# Patient Record
Sex: Male | Born: 1965 | Race: Black or African American | Hispanic: No | Marital: Married | Smoking: Former smoker
Health system: Southern US, Community
[De-identification: ages and names within clinical notes are randomized; demographics above are authoritative.]

## PROBLEM LIST (undated history)

## (undated) ENCOUNTER — Emergency Department (HOSPITAL_COMMUNITY): Admission: EM

## (undated) ENCOUNTER — Emergency Department (HOSPITAL_COMMUNITY): Payer: Medicaid Other

## (undated) DIAGNOSIS — F1010 Alcohol abuse, uncomplicated: Secondary | ICD-10-CM

## (undated) DIAGNOSIS — F1210 Cannabis abuse, uncomplicated: Secondary | ICD-10-CM

## (undated) DIAGNOSIS — R591 Generalized enlarged lymph nodes: Secondary | ICD-10-CM

## (undated) DIAGNOSIS — C801 Malignant (primary) neoplasm, unspecified: Secondary | ICD-10-CM

## (undated) HISTORY — PX: HERNIA REPAIR: SHX51

---

## 2000-03-11 ENCOUNTER — Encounter: Payer: Self-pay | Admitting: Internal Medicine

## 2000-03-11 ENCOUNTER — Emergency Department (HOSPITAL_COMMUNITY): Admission: EM | Admit: 2000-03-11 | Discharge: 2000-03-11 | Payer: Self-pay

## 2007-12-20 ENCOUNTER — Emergency Department (HOSPITAL_COMMUNITY): Admission: EM | Admit: 2007-12-20 | Discharge: 2007-12-20 | Payer: Self-pay | Admitting: Emergency Medicine

## 2009-04-14 ENCOUNTER — Emergency Department (HOSPITAL_COMMUNITY): Admission: EM | Admit: 2009-04-14 | Discharge: 2009-04-14 | Payer: Self-pay | Admitting: Family Medicine
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
## 2010-09-22 ENCOUNTER — Inpatient Hospital Stay (INDEPENDENT_AMBULATORY_CARE_PROVIDER_SITE_OTHER)
Admission: RE | Admit: 2010-09-22 | Discharge: 2010-09-22 | Disposition: A | Discharge status: Home / Self Care | Payer: Self-pay | Source: Ambulatory Visit | Attending: Emergency Medicine | Admitting: Emergency Medicine

## 2010-09-22 ENCOUNTER — Ambulatory Visit (INDEPENDENT_AMBULATORY_CARE_PROVIDER_SITE_OTHER): Payer: Self-pay

## 2010-09-22 DIAGNOSIS — S20229A Contusion of unspecified back wall of thorax, initial encounter: Secondary | ICD-10-CM

## 2010-09-22 DIAGNOSIS — R071 Chest pain on breathing: Secondary | ICD-10-CM
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
## 2010-11-21 LAB — POCT URINALYSIS DIP (DEVICE)
Bilirubin Urine: NEGATIVE
Glucose, UA: NEGATIVE mg/dL
Nitrite: NEGATIVE
Protein, ur: NEGATIVE mg/dL
Specific Gravity, Urine: 1.025 (ref 1.005–1.030)
Urobilinogen, UA: 1 mg/dL (ref 0.0–1.0)
pH: 7 (ref 5.0–8.0)

## 2017-02-09 ENCOUNTER — Encounter (HOSPITAL_COMMUNITY): Payer: Self-pay | Admitting: *Deleted

## 2017-02-09 ENCOUNTER — Ambulatory Visit (HOSPITAL_COMMUNITY)
Admission: EM | Admit: 2017-02-09 | Discharge: 2017-02-09 | Disposition: A | Discharge status: Home / Self Care | Payer: Self-pay | Attending: Family Medicine | Admitting: Family Medicine

## 2017-02-09 DIAGNOSIS — R300 Dysuria: Secondary | ICD-10-CM | POA: Insufficient documentation

## 2017-02-09 DIAGNOSIS — R360 Urethral discharge without blood: Secondary | ICD-10-CM

## 2017-02-09 DIAGNOSIS — R369 Urethral discharge, unspecified: Secondary | ICD-10-CM | POA: Insufficient documentation

## 2017-02-09 MED ORDER — AZITHROMYCIN 250 MG PO TABS: 1000.0000 mg | ORAL_TABLET | Freq: Once | ORAL | Status: DC

## 2017-02-09 MED ORDER — CEFTRIAXONE SODIUM 250 MG IJ SOLR
250.0000 mg | Freq: Once | INTRAMUSCULAR | Status: AC
  Administered 2017-02-09: 250 mg via INTRAMUSCULAR

## 2017-02-09 MED ORDER — AZITHROMYCIN 250 MG PO TABS
1000.0000 mg | ORAL_TABLET | Freq: Once | ORAL | Status: AC
  Administered 2017-02-09: 1000 mg via ORAL
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
## 2017-02-10 LAB — URINE CYTOLOGY ANCILLARY ONLY
CHLAMYDIA, DNA PROBE: NEGATIVE
Neisseria Gonorrhea: POSITIVE — AB
Trichomonas: NEGATIVE

## 2017-02-11 NOTE — ED Provider Notes (Signed)
  [place]   [id number] 02/09/17 Arrival Time: [place]:  1. Penile discharge, without blood     Meds ordered this encounter  Medications  . cefTRIAXone (ROCEPHIN) injection 250 mg  . azithromycin (ZITHROMAX) tablet 1,000 mg   Treated for GC/Chlamydia. Cytology pending. Declines HIV testing.  Reviewed expectations re: course of current medical issues. Questions answered. Outlined signs and symptoms indicating need for more acute intervention. Follow up here or in the Emergency Department if worsening. Patient verbalized understanding. After Visit Summary given.   SUBJECTIVE:  Jesse [name] is a 51 y.o. male who presents with complaint of penile discharge for 1 day. White/yellow. No rashes/lesions. Slight dysuria. No blood noticed. One male sexual partner with occasional condom use. Afebrile. No pelvic, testicular, or abdominal symptoms.  ROS: As per HPI.   OBJECTIVE:  Vitals:   02/09/17 1855  BP: 124/88  Pulse: 78  Resp: 18  Temp: 98.6 F (37 C)  TempSrc: Oral  SpO2: 100%     General appearance: alert, cooperative, appears stated age and no distress Back:  ROM normal; no CVA tenderness Lungs: clear to auscultation bilaterally Heart: regular rate and rhythm Abdomen: soft, non-tender; bowel sounds normal; no masses or organomegaly; no guarding or rebound tenderness GU: declines Skin: warm and dry; no rashes or lesions  No Known Allergies  PMHx, SurgHx, SocialHx, Medications, and Allergies were reviewed in the Visit Navigator and updated as appropriate.       Vanessa Kick, MD 02/11/17 [phone]

## 2018-04-14 ENCOUNTER — Encounter (HOSPITAL_COMMUNITY): Payer: Self-pay

## 2018-04-14 ENCOUNTER — Ambulatory Visit (HOSPITAL_COMMUNITY)
Admission: EM | Admit: 2018-04-14 | Discharge: 2018-04-14 | Disposition: A | Discharge status: Home / Self Care | Payer: Self-pay | Attending: Internal Medicine | Admitting: Internal Medicine

## 2018-04-14 DIAGNOSIS — F17200 Nicotine dependence, unspecified, uncomplicated: Secondary | ICD-10-CM | POA: Insufficient documentation

## 2018-04-14 DIAGNOSIS — R300 Dysuria: Secondary | ICD-10-CM | POA: Insufficient documentation

## 2018-04-14 DIAGNOSIS — Z113 Encounter for screening for infections with a predominantly sexual mode of transmission: Secondary | ICD-10-CM

## 2018-04-14 DIAGNOSIS — Z202 Contact with and (suspected) exposure to infections with a predominantly sexual mode of transmission: Secondary | ICD-10-CM | POA: Insufficient documentation

## 2018-04-14 DIAGNOSIS — R369 Urethral discharge, unspecified: Secondary | ICD-10-CM | POA: Insufficient documentation

## 2018-04-14 DIAGNOSIS — R360 Urethral discharge without blood: Secondary | ICD-10-CM

## 2018-04-14 MED ORDER — AZITHROMYCIN 250 MG PO TABS
1000.0000 mg | ORAL_TABLET | Freq: Once | ORAL | Status: AC
  Administered 2018-04-14: 1000 mg via ORAL

## 2018-04-14 MED ORDER — CEFTRIAXONE SODIUM 250 MG IJ SOLR
INTRAMUSCULAR | Status: AC
  Filled 2018-04-14: qty 250

## 2018-04-14 MED ORDER — CEFTRIAXONE SODIUM 250 MG IJ SOLR
250.0000 mg | Freq: Once | INTRAMUSCULAR | Status: AC
  Administered 2018-04-14: 250 mg via INTRAMUSCULAR

## 2018-04-14 MED ORDER — AZITHROMYCIN 250 MG PO TABS
ORAL_TABLET | ORAL | Status: AC
  Filled 2018-04-14: qty 4

## 2018-04-14 MED ORDER — LIDOCAINE HCL (PF) 1 % IJ SOLN
INTRAMUSCULAR | Status: AC
  Filled 2018-04-14: qty 2

## 2018-04-14 NOTE — Discharge Instructions (Signed)
You were treated empirically for gonorrhea, chlamydia. Azithromycin 1g by mouth and Rocephin 250mg  injection given in office today. Cytology sent, you will be contacted with any positive results that requires further treatment. Refrain from sexual activity and alcohol use for the next 7 days. Monitor for any worsening of symptoms, penile lesion/sore, testicular swelling/pain to follow up for reevaluation.

## 2018-04-14 NOTE — ED Provider Notes (Signed)
[facility]    CSN: [id number] Arrival date & time: 04/14/18  1719     History   Chief Complaint Chief Complaint  Patient presents with  . Exposure to STD    HPI Jesse [name] is a 52 y.o. male.   52 year old male comes in for few day history of penile discharge, dysuria.  Denies fever, chills, night sweats.  Denies abdominal pain, nausea, vomiting.  Denies urinary frequency, hematuria.  Denies penile lesion, testicular swelling, testicular pain.  Sexually active with 2 male partners, no condom use.     History reviewed. No pertinent past medical history.  There are no active problems to display for this patient.   History reviewed. No pertinent surgical history.     Home Medications    Prior to Admission medications   Not on File    Family History History reviewed. No pertinent family history.  Social History Social History   Tobacco Use  . Smoking status: Current Some Day Smoker  . Smokeless tobacco: Never Used  Substance Use Topics  . Alcohol use: Yes  . Drug use: Not on file     Allergies   Patient has no known allergies.   Review of Systems Review of Systems  Reason unable to perform ROS: See HPI as above.     Physical Exam Triage Vital Signs ED Triage Vitals [04/14/18 1755]  Enc Vitals Group     BP 108/68     Pulse Rate 66     Resp 20     Temp 98.4 F (36.9 C)     Temp Source Oral     SpO2 99 %     Weight      Height      Head Circumference      Peak Flow      Pain Score      Pain Loc      Pain Edu?      Excl. in [place]?    No data found.  Updated Vital Signs BP 108/68 (BP Location: Right Arm)   Pulse 66   Temp 98.4 F (36.9 C) (Oral)   Resp 20   SpO2 99%   Physical Exam  Constitutional: He is oriented to person, place, and time. He appears well-developed and well-nourished. No distress.  HENT:  Head: Normocephalic and atraumatic.  Eyes: Pupils are equal, round, and reactive to light. Conjunctivae are  normal.  Neurological: He is alert and oriented to person, place, and time.  Skin: He is not diaphoretic.     UC Treatments / Results  Labs (all labs ordered are listed, but only abnormal results are displayed) Labs Reviewed  URINE CYTOLOGY ANCILLARY ONLY    EKG None  Radiology No results found.  Procedures Procedures (including critical care time)  Medications Ordered in UC Medications  azithromycin (ZITHROMAX) tablet 1,000 mg (1,000 mg Oral Given 04/14/18 1832)  cefTRIAXone (ROCEPHIN) injection 250 mg (250 mg Intramuscular Given 04/14/18 1832)    Initial Impression / Assessment and Plan / UC Course  I have reviewed the triage vital signs and the nursing notes.  Pertinent labs & imaging results that were available during my care of the patient were reviewed by me and considered in my medical decision making (see chart for details).    Patient was treated empirically for GC. Azithromycin and Rocephin given in office today. Cytology sent, patient will be contacted with any positive results that require additional treatment. Patient to refrain from sexual activity for the  next 7 days. Return precautions given.   Final Clinical Impressions(s) / UC Diagnoses   Final diagnoses:  Exposure to STD    ED Prescriptions    None        Ok Edwards, PA-C 04/14/18 1947

## 2018-04-14 NOTE — ED Triage Notes (Signed)
Pt presents today after STD exposure for testing and treatment.

## 2018-04-18 ENCOUNTER — Telehealth (HOSPITAL_COMMUNITY): Payer: Self-pay

## 2018-04-18 LAB — URINE CYTOLOGY ANCILLARY ONLY
Chlamydia: NEGATIVE
Neisseria Gonorrhea: POSITIVE — AB
Trichomonas: NEGATIVE

## 2018-04-18 NOTE — Telephone Encounter (Signed)
Test for gonorrhea was positive. This was treated at the urgent care visit with IM rocephin 250mg  and po zithromax 1g.Need to educate patient to refrain from sexual intercourse for 7 days after treatment to give the medicine time to work. Sexual partners need to be notified and tested/treated. Condoms may reduce risk of reinfection. Recheck or followup with PCP for further evaluation if symptoms are not improving.  [facility] notified. Attempted to reach patient. No answer at this time.

## 2018-04-18 NOTE — Telephone Encounter (Signed)
Pt is aware of results. 

## 2018-04-22 LAB — URINE CYTOLOGY ANCILLARY ONLY: CANDIDA VAGINITIS: NEGATIVE

## 2018-08-16 DIAGNOSIS — C801 Malignant (primary) neoplasm, unspecified: Secondary | ICD-10-CM

## 2018-08-16 HISTORY — DX: Malignant (primary) neoplasm, unspecified: C80.1

## 2018-10-07 ENCOUNTER — Encounter (HOSPITAL_COMMUNITY): Payer: Self-pay | Admitting: Family Medicine

## 2018-10-07 ENCOUNTER — Ambulatory Visit (HOSPITAL_COMMUNITY)
Admission: EM | Admit: 2018-10-07 | Discharge: 2018-10-07 | Disposition: A | Discharge status: Home / Self Care | Payer: Self-pay | Attending: Family Medicine | Admitting: Family Medicine

## 2018-10-07 DIAGNOSIS — K047 Periapical abscess without sinus: Secondary | ICD-10-CM

## 2018-10-07 MED ORDER — AMOXICILLIN-POT CLAVULANATE 875-125 MG PO TABS: 1.0000 | ORAL_TABLET | Freq: Two times a day (BID) | ORAL | 0 refills | Status: DC

## 2018-10-07 NOTE — Discharge Instructions (Addendum)
Come back on March 10 at 9:30 am

## 2018-10-07 NOTE — ED Triage Notes (Signed)
Pt here for left sided neck pain with a swollen area x 3 weeks
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
## 2018-10-08 ENCOUNTER — Telehealth (HOSPITAL_COMMUNITY): Payer: Self-pay | Admitting: Emergency Medicine

## 2018-10-08 MED ORDER — AMOXICILLIN 500 MG PO CAPS: 500.0000 mg | ORAL_CAPSULE | Freq: Two times a day (BID) | ORAL | 0 refills | Status: AC

## 2018-10-08 NOTE — Telephone Encounter (Signed)
Pt called and stated he cant afford meds; new RX sent to pharmacy

## 2018-10-24 ENCOUNTER — Other Ambulatory Visit: Payer: Self-pay

## 2018-10-24 ENCOUNTER — Encounter (HOSPITAL_COMMUNITY): Payer: Self-pay

## 2018-10-24 ENCOUNTER — Ambulatory Visit (HOSPITAL_COMMUNITY)
Admission: EM | Admit: 2018-10-24 | Discharge: 2018-10-24 | Disposition: A | Discharge status: Home / Self Care | Payer: Self-pay | Attending: Family Medicine | Admitting: Family Medicine

## 2018-10-24 DIAGNOSIS — S0005XA Superficial foreign body of scalp, initial encounter: Secondary | ICD-10-CM

## 2018-10-24 DIAGNOSIS — R590 Localized enlarged lymph nodes: Secondary | ICD-10-CM

## 2018-10-24 DIAGNOSIS — K0530 Chronic periodontitis, unspecified: Secondary | ICD-10-CM

## 2018-10-24 MED ORDER — AMOXICILLIN 875 MG PO TABS: 875.0000 mg | ORAL_TABLET | Freq: Two times a day (BID) | ORAL | 0 refills | Status: DC
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
## 2018-10-24 NOTE — Discharge Instructions (Addendum)
Wednesday March 18 after 1 pm for removing stitch

## 2018-11-01 ENCOUNTER — Encounter (HOSPITAL_COMMUNITY): Payer: Self-pay | Admitting: Emergency Medicine

## 2018-11-01 ENCOUNTER — Other Ambulatory Visit: Payer: Self-pay

## 2018-11-01 ENCOUNTER — Ambulatory Visit (HOSPITAL_COMMUNITY)
Admission: EM | Admit: 2018-11-01 | Discharge: 2018-11-01 | Disposition: A | Discharge status: Home / Self Care | Payer: Self-pay

## 2018-11-01 DIAGNOSIS — S0000XD Unspecified superficial injury of scalp, subsequent encounter: Secondary | ICD-10-CM

## 2018-11-01 DIAGNOSIS — Z4802 Encounter for removal of sutures: Secondary | ICD-10-CM
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
## 2019-01-30 ENCOUNTER — Telehealth (HOSPITAL_COMMUNITY): Payer: Self-pay | Admitting: Emergency Medicine

## 2019-01-30 NOTE — Telephone Encounter (Signed)
Patient called requesting refill on medication that [facility] prescribed back in march 2020.  Informed patient the [facility] does not write refills on medications (not a primary care facility).  Patient reports he will come to [facility] tomorrow for the tooth pain and medication.

## 2019-01-31 ENCOUNTER — Encounter (HOSPITAL_COMMUNITY): Payer: Self-pay | Admitting: Emergency Medicine

## 2019-01-31 ENCOUNTER — Other Ambulatory Visit: Payer: Self-pay

## 2019-01-31 ENCOUNTER — Ambulatory Visit (HOSPITAL_COMMUNITY)
Admission: EM | Admit: 2019-01-31 | Discharge: 2019-01-31 | Disposition: A | Discharge status: Home / Self Care | Payer: Self-pay | Attending: Internal Medicine | Admitting: Internal Medicine

## 2019-01-31 DIAGNOSIS — K0889 Other specified disorders of teeth and supporting structures: Secondary | ICD-10-CM

## 2019-01-31 DIAGNOSIS — R591 Generalized enlarged lymph nodes: Secondary | ICD-10-CM

## 2019-01-31 MED ORDER — AMOXICILLIN-POT CLAVULANATE 875-125 MG PO TABS: 1.0000 | ORAL_TABLET | Freq: Two times a day (BID) | ORAL | 0 refills | Status: AC

## 2019-01-31 NOTE — ED Provider Notes (Signed)
[place]    CSN: [id number] Arrival date & time: 01/31/19  1019      History   Chief Complaint Chief Complaint  Patient presents with  . Dental Pain    HPI Jesse [name] is a 53 y.o. male no significant past medical history presenting today for evaluation of dental pain.  Patient states that he has had left-sided lower dental pain over the past 1 to 2 weeks.  He is noticed some mainly swelling and is concerned about infection.  Pain has been manageable and is been relatively mild.  He has previously been on antibiotics which is helped with her symptoms.  He is concerned as he notices the swelling come back relatively quickly after completion of antibiotics.  He has not been able to follow-up with dentistry.  He denies any fevers, neck stiffness. HPI  History reviewed. No pertinent past medical history.  There are no active problems to display for this patient.   History reviewed. No pertinent surgical history.     Home Medications    Prior to Admission medications   Medication Sig Start Date End Date Taking? Authorizing Provider  amoxicillin-clavulanate (AUGMENTIN) 875-125 MG tablet Take 1 tablet by mouth every 12 (twelve) hours for 7 days. 01/31/19 02/07/19  Elveria Lauderbaugh, Elesa Hacker, PA-C    Family History Family History  Problem Relation Age of Onset  . Cancer Mother     Social History Social History   Tobacco Use  . Smoking status: Current Some Day Smoker  . Smokeless tobacco: Never Used  Substance Use Topics  . Alcohol use: Yes  . Drug use: Yes    Types: Marijuana     Allergies   Patient has no known allergies.   Review of Systems Review of Systems  Constitutional: Negative for activity change, appetite change, chills, fatigue and fever.  HENT: Positive for dental problem. Negative for congestion, ear pain, rhinorrhea, sinus pressure, sore throat and trouble swallowing.   Eyes: Negative for discharge and redness.  Respiratory: Negative for  cough, chest tightness and shortness of breath.   Cardiovascular: Negative for chest pain.  Gastrointestinal: Negative for abdominal pain, diarrhea, nausea and vomiting.  Musculoskeletal: Negative for myalgias.  Skin: Negative for rash.  Neurological: Negative for dizziness, light-headedness and headaches.     Physical Exam Triage Vital Signs ED Triage Vitals [01/31/19 1039]  Enc Vitals Group     BP (!) 139/95     Pulse Rate 85     Resp 18     Temp (!) 97.4 F (36.3 C)     Temp Source Oral     SpO2 98 %     Weight      Height      Head Circumference      Peak Flow      Pain Score 8     Pain Loc      Pain Edu?      Excl. in [place]?    No data found.  Updated Vital Signs BP (!) 139/95 (BP Location: Right Arm)   Pulse 85   Temp (!) 97.4 F (36.3 C) (Oral)   Resp 18   SpO2 98%   Visual Acuity Right Eye Distance:   Left Eye Distance:   Bilateral Distance:    Right Eye Near:   Left Eye Near:    Bilateral Near:     Physical Exam Vitals signs and nursing note reviewed.  Constitutional:      Appearance: He is well-developed.  HENT:     Head: Normocephalic and atraumatic.     Mouth/Throat:     Comments: Oral mucosa pink and moist, no tonsillar enlargement or exudate. Posterior pharynx patent and nonerythematous, no uvula deviation or swelling. Normal phonation.  Overall poor dentition multiple missing, broken or decaying teeth.  Left lower gingiva with tenderness to palpation and slight erythema.  No obvious abscess noted. Eyes:     Conjunctiva/sclera: Conjunctivae normal.  Neck:     Musculoskeletal: Neck supple.     Comments: Palpable lymphadenopathy present on anterior cervical chain and posterior cervical chain, nontender to touch, mobile Cardiovascular:     Rate and Rhythm: Normal rate and regular rhythm.     Heart sounds: No murmur.  Pulmonary:     Effort: Pulmonary effort is normal. No respiratory distress.     Breath sounds: Normal breath sounds.   Abdominal:     Palpations: Abdomen is soft.     Tenderness: There is no abdominal tenderness.  Skin:    General: Skin is warm and dry.  Neurological:     Mental Status: He is alert.      UC Treatments / Results  Labs (all labs ordered are listed, but only abnormal results are displayed) Labs Reviewed - No data to display  EKG None  Radiology No results found.  Procedures Procedures (including critical care time)  Medications Ordered in UC Medications - No data to display  Initial Impression / Assessment and Plan / UC Course  I have reviewed the triage vital signs and the nursing notes.  Pertinent labs & imaging results that were available during my care of the patient were reviewed by me and considered in my medical decision making (see chart for details).     Will cover for dental infection with Augmentin twice daily x1 week.  Tylenol and ibuprofen for pain.  Does have lymphadenopathy, questionable if this is the swelling patient is concerned about versus traditional swelling.  Will have follow-up with PCP if lymph node swelling persisting without resolution.  Provided dental resources to follow-up with dentistry for further management of poor dentition.  No soft palate swelling, do not suspect deep space infection at this time.  Vital signs stable.Discussed strict return precautions. Patient verbalized understanding and is agreeable with plan.  Final Clinical Impressions(s) / UC Diagnoses   Final diagnoses:  Pain, dental  Lymphadenopathy     Discharge Instructions     Begin Augmentin twice daily for the next week to cover for infection in the mouth Tylenol and ibuprofen for pain and swelling in lymph nodes Warm compresses  Please use attached dental resources for further management of teeth If continuing to have swollen lymph nodes please follow-up with primary care, contact info below   ED Prescriptions    Medication Sig Dispense Auth. Provider    amoxicillin-clavulanate (AUGMENTIN) 875-125 MG tablet Take 1 tablet by mouth every 12 (twelve) hours for 7 days. 14 tablet Eathen Budreau, [place] C, PA-C     Controlled Substance Prescriptions [place] Controlled Substance Registry consulted? Not Applicable   Janith Lima, [place] 01/31/19 1139

## 2019-01-31 NOTE — ED Triage Notes (Signed)
Pt here for left sided dental pain

## 2019-01-31 NOTE — Discharge Instructions (Addendum)
Begin Augmentin twice daily for the next week to cover for infection in the mouth Tylenol and ibuprofen for pain and swelling in lymph nodes Warm compresses  Please use attached dental resources for further management of teeth If continuing to have swollen lymph nodes please follow-up with primary care, contact info below

## 2019-04-18 ENCOUNTER — Other Ambulatory Visit: Payer: Self-pay

## 2019-04-18 ENCOUNTER — Emergency Department (HOSPITAL_COMMUNITY)
Admission: EM | Admit: 2019-04-18 | Discharge: 2019-04-18 | Disposition: A | Discharge status: Home / Self Care | Payer: Medicaid Other | Attending: Emergency Medicine | Admitting: Emergency Medicine

## 2019-04-18 ENCOUNTER — Encounter (HOSPITAL_COMMUNITY): Payer: Self-pay

## 2019-04-18 ENCOUNTER — Emergency Department (HOSPITAL_COMMUNITY): Payer: Medicaid Other

## 2019-04-18 DIAGNOSIS — R509 Fever, unspecified: Secondary | ICD-10-CM | POA: Insufficient documentation

## 2019-04-18 DIAGNOSIS — R0602 Shortness of breath: Secondary | ICD-10-CM | POA: Diagnosis not present

## 2019-04-18 DIAGNOSIS — R590 Localized enlarged lymph nodes: Secondary | ICD-10-CM | POA: Diagnosis not present

## 2019-04-18 DIAGNOSIS — R10816 Epigastric abdominal tenderness: Secondary | ICD-10-CM | POA: Diagnosis not present

## 2019-04-18 DIAGNOSIS — Z20828 Contact with and (suspected) exposure to other viral communicable diseases: Secondary | ICD-10-CM | POA: Diagnosis not present

## 2019-04-18 DIAGNOSIS — F1290 Cannabis use, unspecified, uncomplicated: Secondary | ICD-10-CM | POA: Diagnosis not present

## 2019-04-18 DIAGNOSIS — R221 Localized swelling, mass and lump, neck: Secondary | ICD-10-CM | POA: Diagnosis present

## 2019-04-18 DIAGNOSIS — Z720 Tobacco use: Secondary | ICD-10-CM | POA: Diagnosis not present

## 2019-04-18 LAB — COMPREHENSIVE METABOLIC PANEL
ALT: 18 U/L (ref 0–44)
AST: 20 U/L (ref 15–41)
Albumin: 3.6 g/dL (ref 3.5–5.0)
Alkaline Phosphatase: 118 U/L (ref 38–126)
Anion gap: 12 (ref 5–15)
BUN: 11 mg/dL (ref 6–20)
CO2: 24 mmol/L (ref 22–32)
Calcium: 8.9 mg/dL (ref 8.9–10.3)
Chloride: 97 mmol/L — ABNORMAL LOW (ref 98–111)
Creatinine, Ser: 0.85 mg/dL (ref 0.61–1.24)
GFR calc Af Amer: >60 mL/min (ref >60)
GFR calc non Af Amer: >60 mL/min (ref >60)
Glucose, Bld: 97 mg/dL (ref 70–99)
Potassium: 3.4 mmol/L — ABNORMAL LOW (ref 3.5–5.1)
Sodium: 133 mmol/L — ABNORMAL LOW (ref 135–145)
Total Bilirubin: 1 mg/dL (ref 0.3–1.2)
Total Protein: 7.6 g/dL (ref 6.5–8.1)

## 2019-04-18 LAB — CBC WITH DIFFERENTIAL/PLATELET
Abs Immature Granulocytes: 0 10*3/uL (ref 0.00–0.07)
Basophils Absolute: 0 10*3/uL (ref 0.0–0.1)
Basophils Relative: 0 %
Eosinophils Absolute: 0 10*3/uL (ref 0.0–0.5)
Eosinophils Relative: 0 %
HCT: 38.4 % — ABNORMAL LOW (ref 39.0–52.0)
Hemoglobin: 12.9 g/dL — ABNORMAL LOW (ref 13.0–17.0)
Lymphocytes Relative: 20 %
Lymphs Abs: 1.1 10*3/uL (ref 0.7–4.0)
MCH: 30.4 pg (ref 26.0–34.0)
MCHC: 33.6 g/dL (ref 30.0–36.0)
MCV: 90.6 fL (ref 80.0–100.0)
Monocytes Absolute: 0.9 10*3/uL (ref 0.1–1.0)
Monocytes Relative: 16 %
Neutro Abs: 3.6 10*3/uL (ref 1.7–7.7)
Neutrophils Relative %: 64 %
Platelets: 385 10*3/uL (ref 150–400)
RBC: 4.24 MIL/uL (ref 4.22–5.81)
RDW: 12.8 % (ref 11.5–15.5)
WBC: 5.6 10*3/uL (ref 4.0–10.5)
nRBC: 0 % (ref 0.0–0.2)
nRBC: 0 /100 WBC

## 2019-04-18 LAB — URINALYSIS, ROUTINE W REFLEX MICROSCOPIC
Bilirubin Urine: NEGATIVE
Glucose, UA: NEGATIVE mg/dL
Hgb urine dipstick: NEGATIVE
Ketones, ur: NEGATIVE mg/dL
Leukocytes,Ua: NEGATIVE
Nitrite: NEGATIVE
Protein, ur: NEGATIVE mg/dL
Specific Gravity, Urine: 1.002 — ABNORMAL LOW (ref 1.005–1.030)
pH: 8 (ref 5.0–8.0)

## 2019-04-18 LAB — LIPASE, BLOOD: Lipase: 20 U/L (ref 11–51)

## 2019-04-18 LAB — GROUP A STREP BY PCR: Group A Strep by PCR: NOT DETECTED

## 2019-04-18 LAB — SARS CORONAVIRUS 2 BY RT PCR (HOSPITAL ORDER, PERFORMED IN ~~LOC~~ HOSPITAL LAB): SARS Coronavirus 2: NEGATIVE

## 2019-04-18 IMAGING — CT CT CHEST W/ CM
2 of 4 series · 13 of 36 positions shown, 16 images · IV contrast (omnipaque)
Comparison: None.

CLINICAL DATA: Several months of enlarged lymph nodes in the left
side of the neck and supraclavicular area. Abdominal discomfort.
Possible weight loss and recent fever.

EXAM:
CT CHEST, ABDOMEN, AND PELVIS WITH CONTRAST
TECHNIQUE: Multidetector CT imaging of the chest, abdomen and pelvis was
performed following the standard protocol during bolus
administration of intravenous contrast.
CONTRAST:  75 mL OMNIPAQUE IOHEXOL 300 MG/ML  SOLN

[Series 3: cap with 5mm st · axial · 0.72mm/px · z∈[+380,+940]mm · 10 of 134 slices shown, 13 images]
[im 11/134  mediastinal]
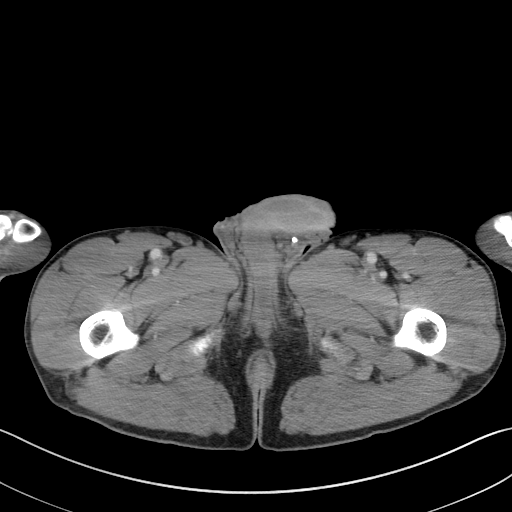
[im 11/134  lung]
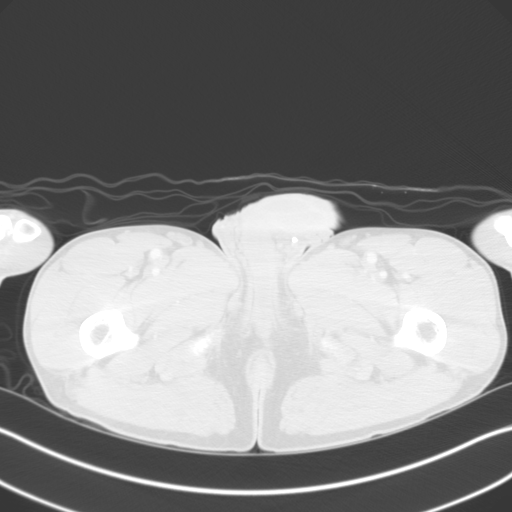
[im 21/134  lung]
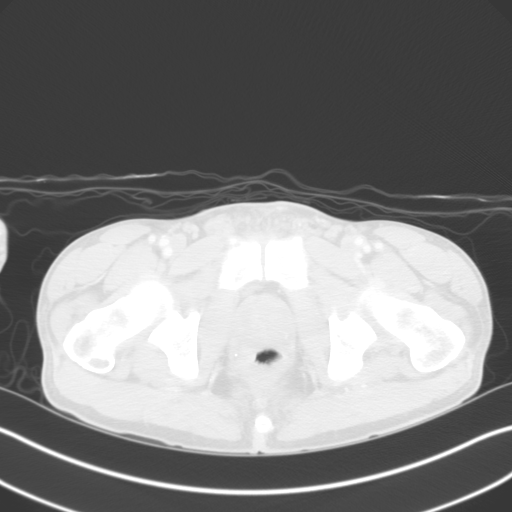
[im 41/134  lung]
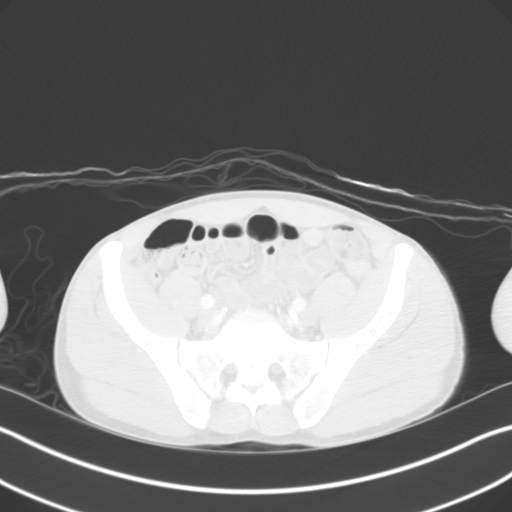
[im 52/134  lung]
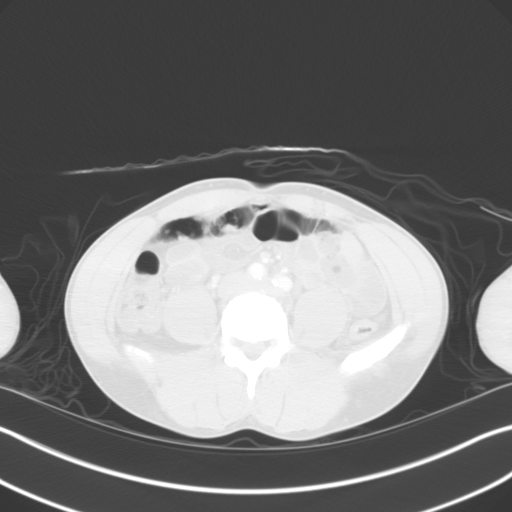
[im 62/134  mediastinal]
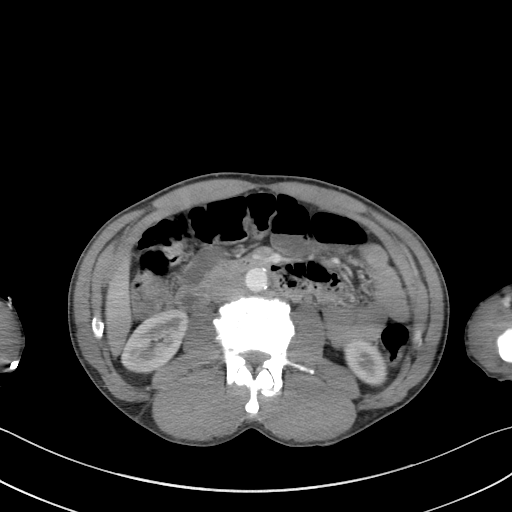
[im 62/134  lung]
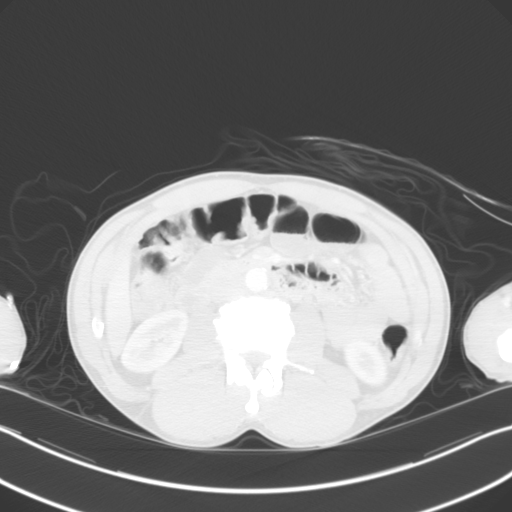
[im 72/134  lung]
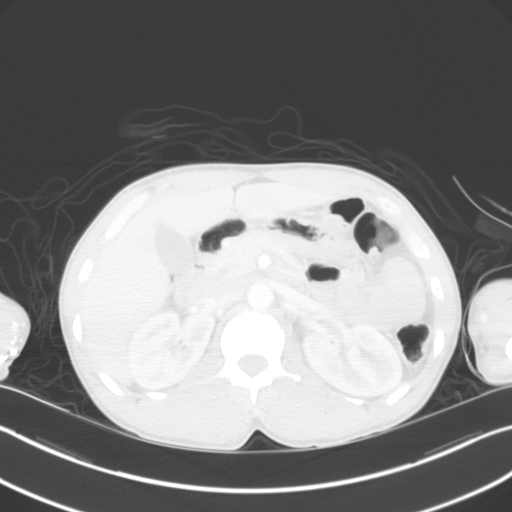
[im 82/134  lung]
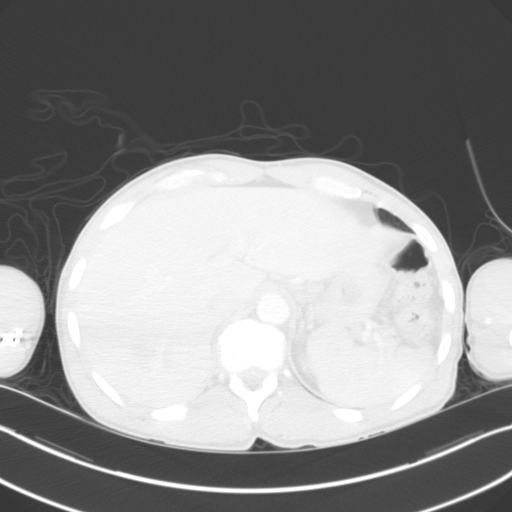
[im 103/134  lung]
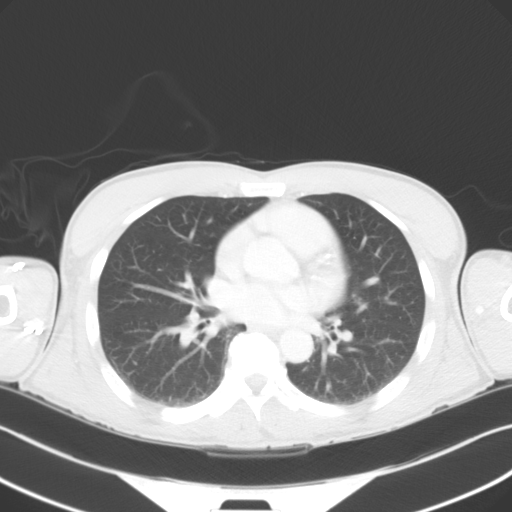
[im 113/134  mediastinal]
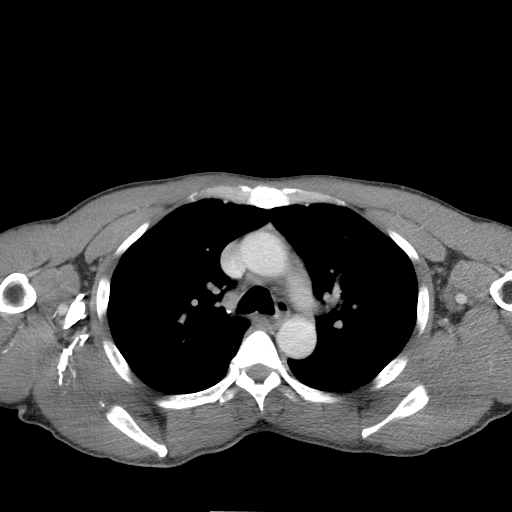
[im 113/134  lung]
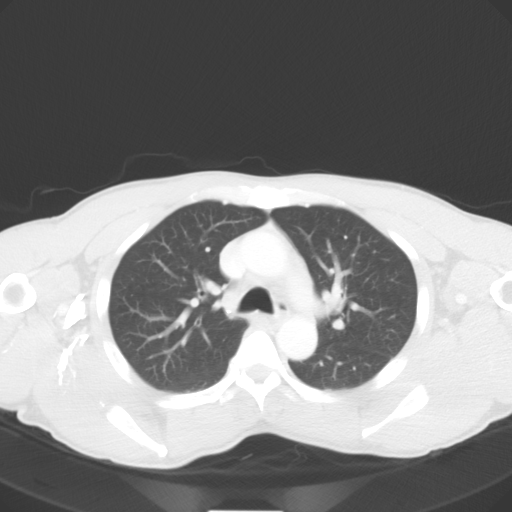
[im 123/134  lung]
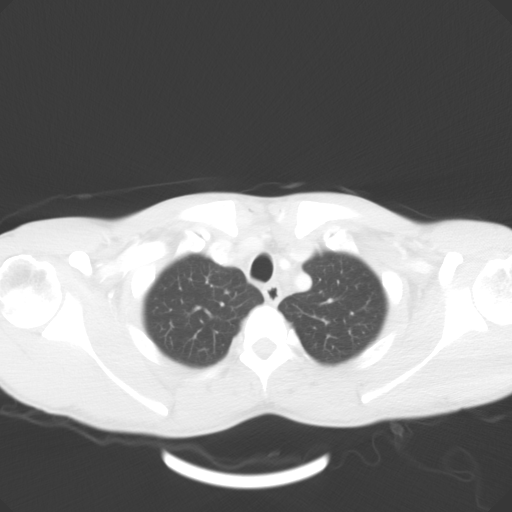

[Series 6: cap with 3mm st cor · coronal · 0.69mm/px · 3 of 141 slices shown]
[im 29/141  lung]
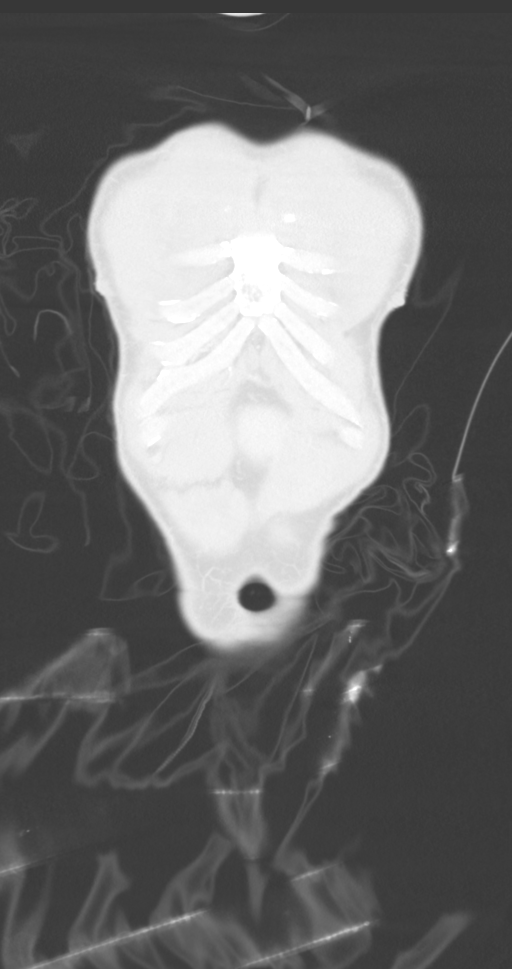
[im 57/141  lung]
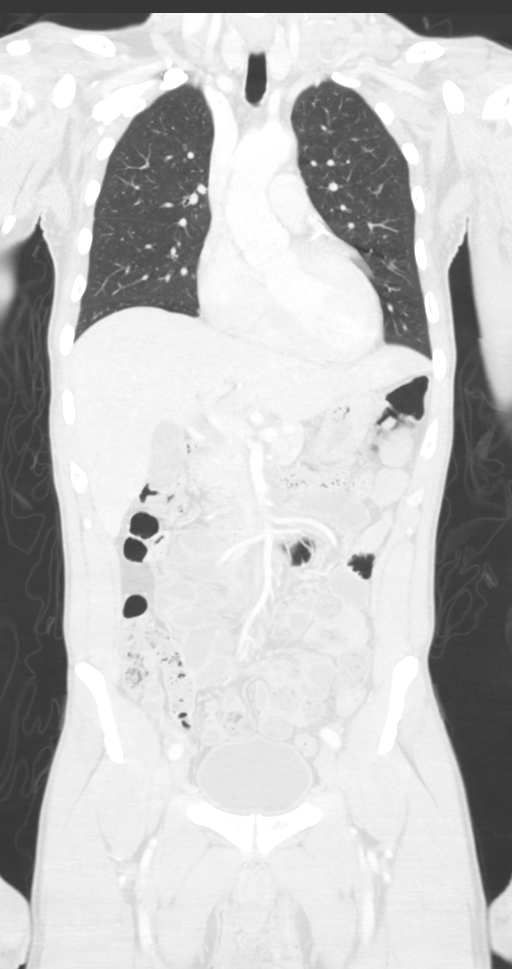
[im 85/141  lung]
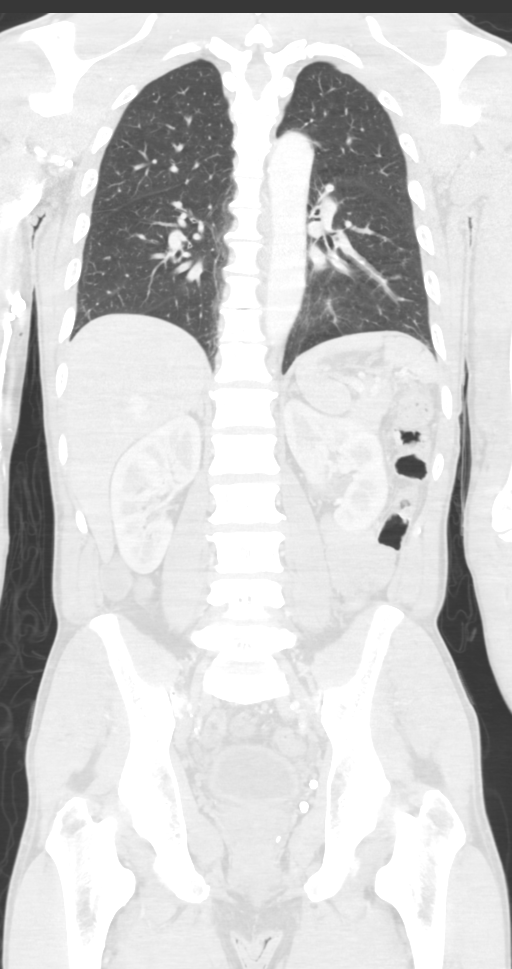

[13 of 36 positions shown; findings below may reference images not displayed]

FINDINGS: CT CHEST FINDINGS

Cardiovascular: No significant vascular findings. Normal heart size.
No pericardial effusion. Calcific aortic and coronary
atherosclerosis noted.

Mediastinum/Nodes: 1.5 cm lymph node in the lower left neck is seen
on image 3 of series 3. There is also a supraclavicular lymph node
on the left measuring 1.3 cm short axis dimension on image 7 series
[DATE] cm in diameter left supraclavicular node is identified on
image 6 of series 3. No axillary, hilar or mediastinal
lymphadenopathy is identified. Esophagus and thyroid gland appear
normal.

Lungs/Pleura: Lungs are clear.  No pleural effusion.

Musculoskeletal: No lytic or sclerotic lesion. No fracture. A few
small Schmorl's nodes in the thoracic spine are noted.

CT ABDOMEN PELVIS FINDINGS

Hepatobiliary: 2.7 cm in diameter hypoattenuating lesion with
peripheral nodular enhancement in the right hepatic lobe on image 54
is consistent with a hemangioma. Two tiny cysts are also identified
on image 49. The liver is otherwise unremarkable. Gallbladder and
biliary tree appear normal.

Pancreas: Unremarkable. No pancreatic ductal dilatation or
surrounding inflammatory changes.

Spleen: Normal in size without focal abnormality.

Adrenals/Urinary Tract: Adrenal glands are unremarkable. Kidneys are
normal, without renal calculi, focal lesion, or hydronephrosis.
Bladder is unremarkable.

Stomach/Bowel: Stomach is within normal limits. Appendix appears
normal. No evidence of bowel wall thickening, distention, or
inflammatory changes.

Vascular/Lymphatic: Aortic atherosclerosis. No enlarged abdominal or
pelvic lymph nodes.

Reproductive: Mild prostatomegaly.

Other: None.

Musculoskeletal: No lytic or sclerotic lesion.  No fracture.
IMPRESSION: Left supraclavicular lymphadenopathy worrisome for
lymphoproliferative disease. No other lymphadenopathy is seen in the
chest, abdomen or pelvis.

Calcific aortic and coronary atherosclerosis.

Benign hemangioma right lobe of the liver.

## 2019-04-18 IMAGING — CT CT NECK W/ CM
3 of 5 series · 13 of 33 positions shown, 16 images · IV contrast (Omni 300)
Comparison: None.

CLINICAL DATA: Enlarged lymph nodes neck and axilla

EXAM:
CT NECK WITH CONTRAST
TECHNIQUE: Multidetector CT imaging of the neck was performed using the
standard protocol following the bolus administration of intravenous
contrast.
CONTRAST:  75mL OMNIPAQUE IOHEXOL 300 MG/ML  SOLN

[Series 6: neck 2.0 st · sagittal · 0.44mm/px · 5 of 101 slices shown, 6 images (1 of 2)]
[im 34/101  bone]
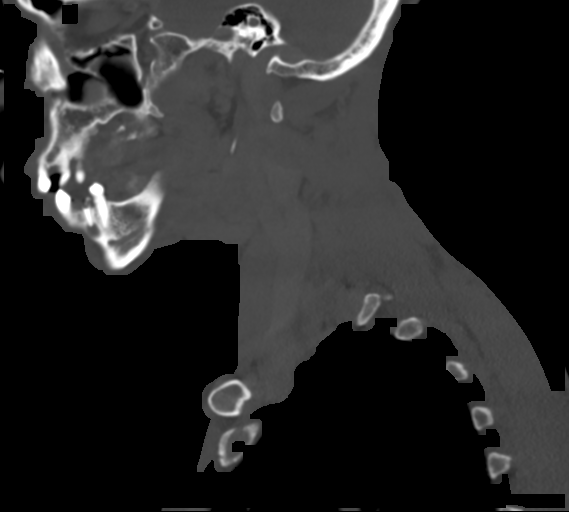
[im 42/101  bone]
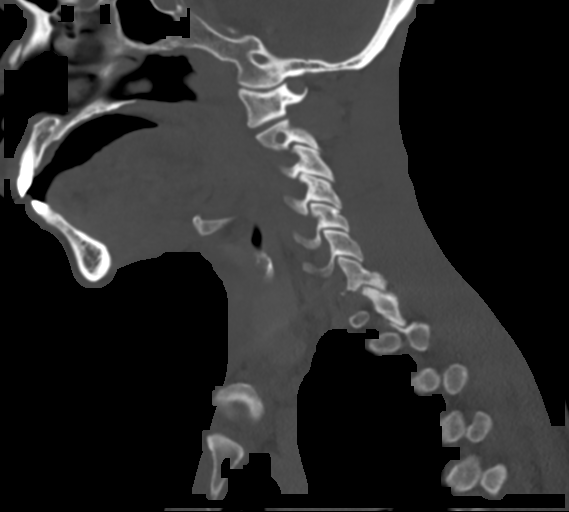
[im 51/101  soft-tissue]
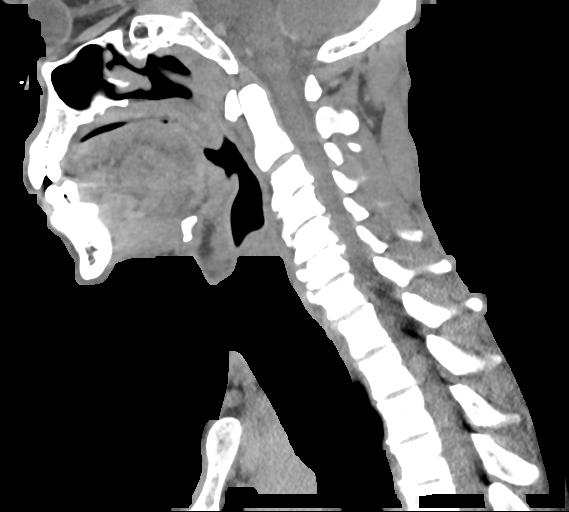
[im 51/101  bone]
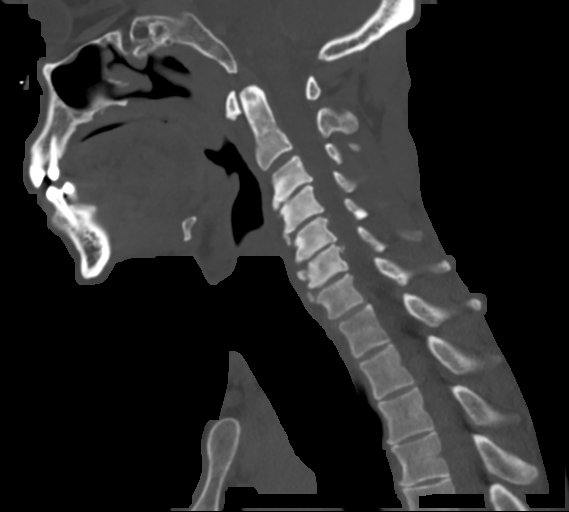
[im 59/101  bone]
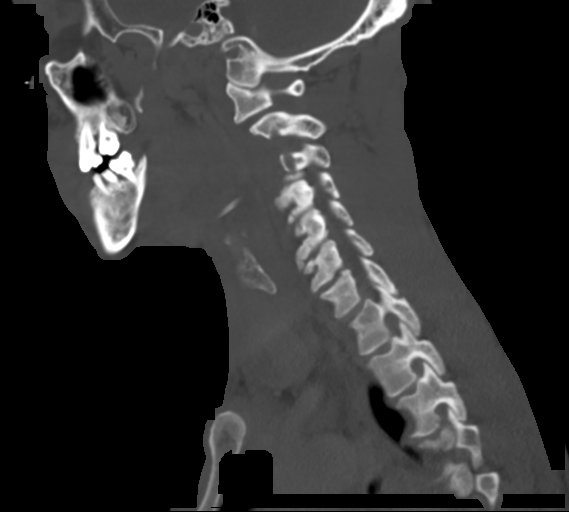
[im 67/101  bone]
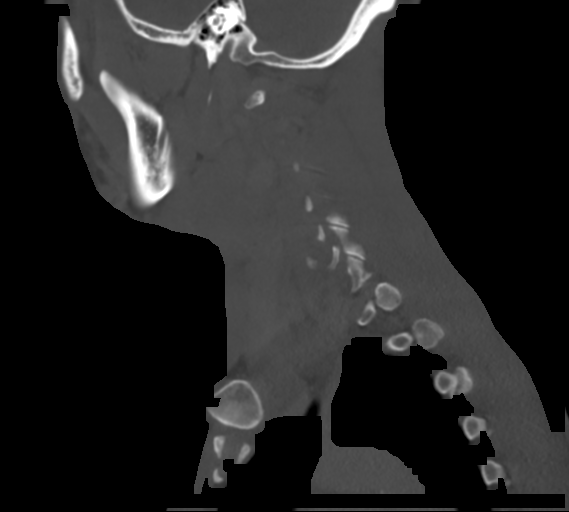

[Series 7: neck 2.0 st · coronal · 0.44mm/px · 3 of 128 slices shown (2 of 2)]
[im 26/128  bone]
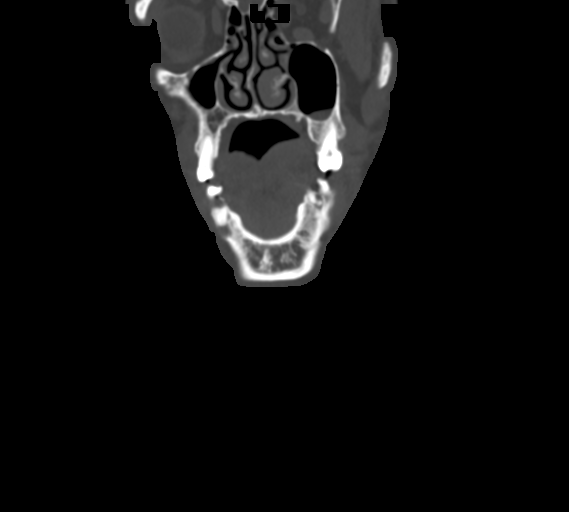
[im 51/128  bone]
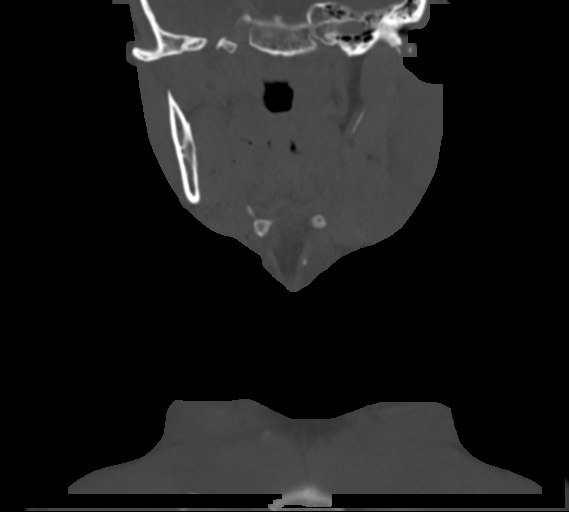
[im 77/128  bone]
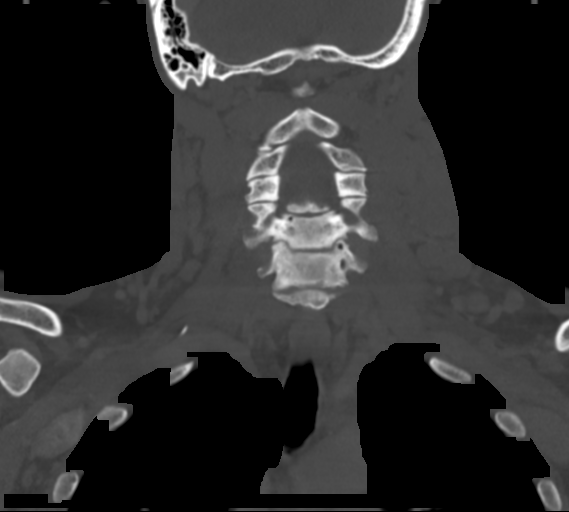

[Series 8: neck 2.0 st orthogonal · axial · 0.39mm/px · z∈[+857,+1040]mm · 5 of 151 slices shown, 7 images]
[im 26/151  soft-tissue]
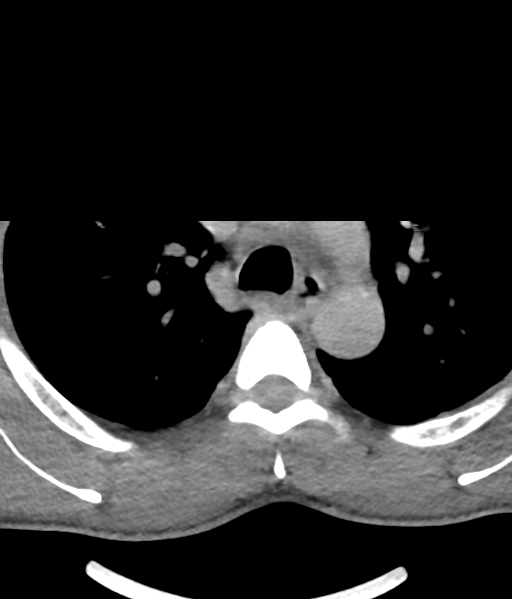
[im 26/151  bone]
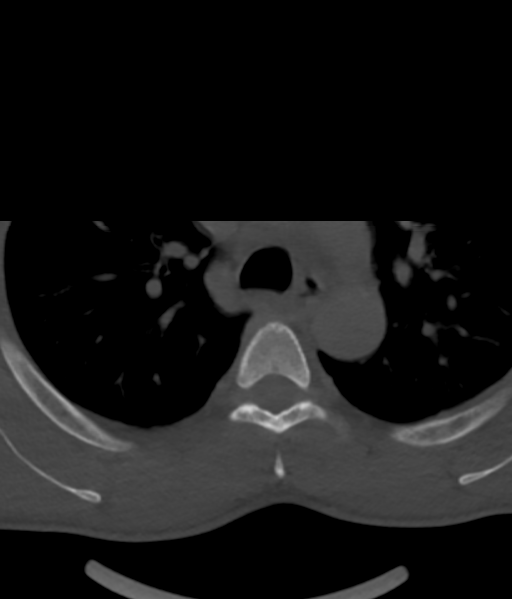
[im 51/151  bone]
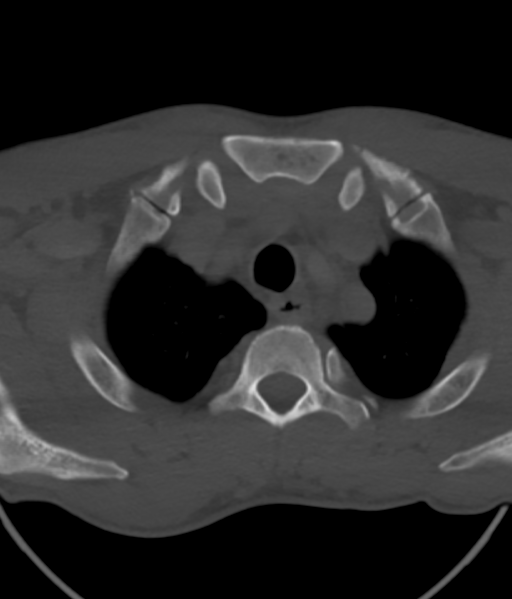
[im 76/151  bone]
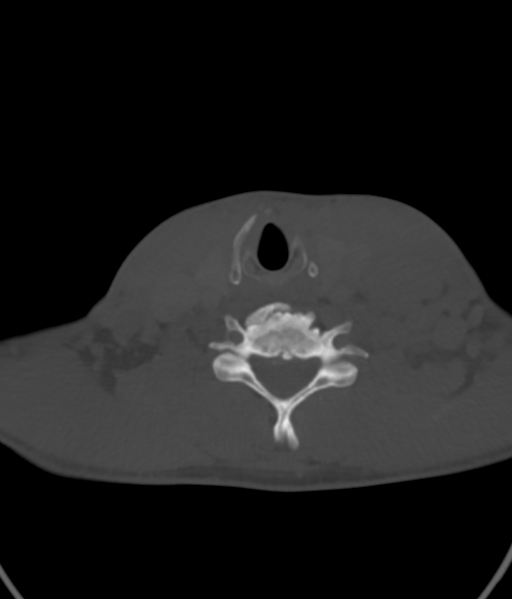
[im 101/151  bone]
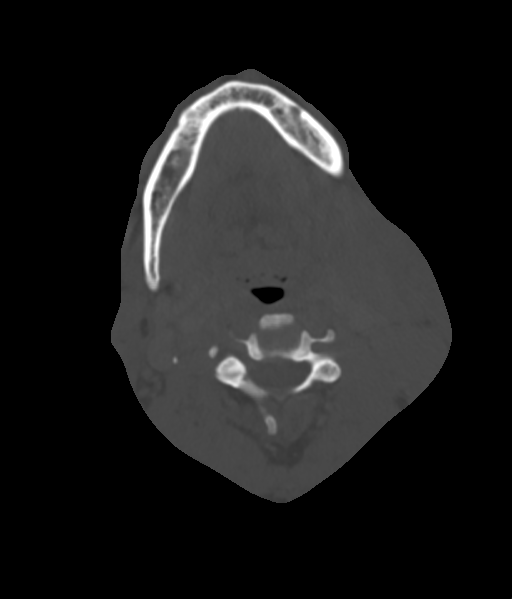
[im 126/151  soft-tissue]
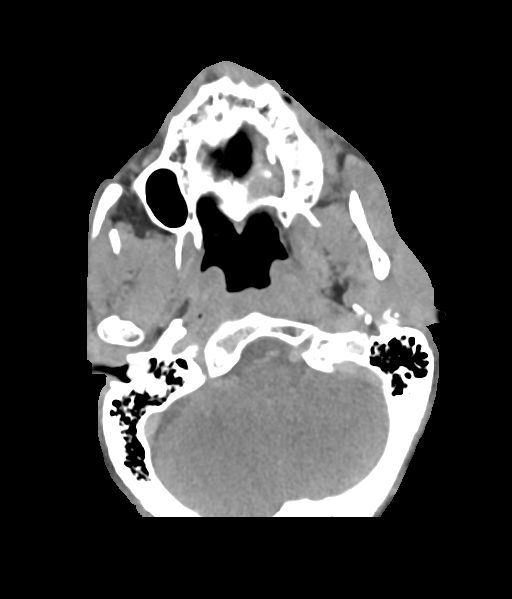
[im 126/151  bone]
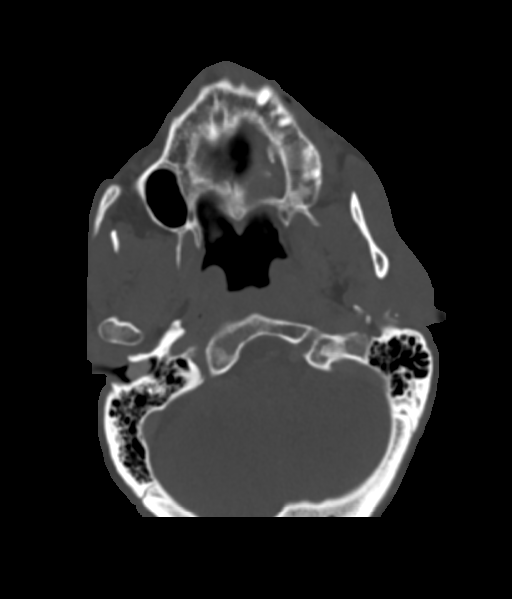

[13 of 33 positions shown; findings below may reference images not displayed]

FINDINGS: Pharynx and larynx: Normal. No mass or swelling.

Salivary glands: Diffusely enlarged parotid glands bilaterally. No
focal mass. Submandibular glands also mildly enlarged bilaterally.

Thyroid: Negative

Lymph nodes: Bulky lymphadenopathy left neck. Numerous large lymph
nodes. Large left lymph node at the level of the hyoid measures 30
mm. Posterior lymph node at the same level measures 27 mm. Numerous
additional enlarged lymph nodes are seen down to the supraclavicular
fossa and left axilla.

Scattered small nodes in the right neck without definite pathologic
nodes on the right. No enlarged lymph nodes in the right axilla.

Vascular: Normal vascular enhancement. Left jugular vein compressed
by adenopathy.

Limited intracranial: Negative

Visualized orbits: Negative

Mastoids and visualized paranasal sinuses: Mild mucosal edema left
maxillary sinus otherwise clear.

Skeleton: Poor dentition with numerous caries

Cervical spondylosis, moderate in degree.

Upper chest: Chest CT reported separately

Other: None
IMPRESSION: Bulky lymphadenopathy throughout the left neck and left axilla
compatible with neoplasm. This may represent lymphoma or metastatic
disease. Small lymph nodes are present in the right neck. No
pharyngeal mass identified.

Poor dentition

## 2019-04-18 IMAGING — CT CT ABD-PELV W/ CM
2 of 5 series · 13 of 36 positions shown, 16 images · IV contrast (Omni 300)
Comparison: None.

CLINICAL DATA: Several months of enlarged lymph nodes in the left
side of the neck and supraclavicular area. Abdominal discomfort.
Possible weight loss and recent fever.

EXAM:
CT CHEST, ABDOMEN, AND PELVIS WITH CONTRAST
TECHNIQUE: Multidetector CT imaging of the chest, abdomen and pelvis was
performed following the standard protocol during bolus
administration of intravenous contrast.
CONTRAST:  75 mL OMNIPAQUE IOHEXOL 300 MG/ML  SOLN

[Series 3: cap with 5mm st · axial · 0.72mm/px · z∈[+390,+930]mm · 10 of 134 slices shown, 13 images]
[im 13/134  mediastinal]
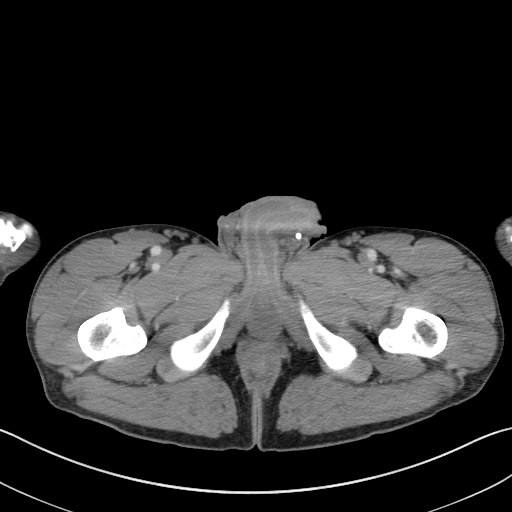
[im 13/134  lung]
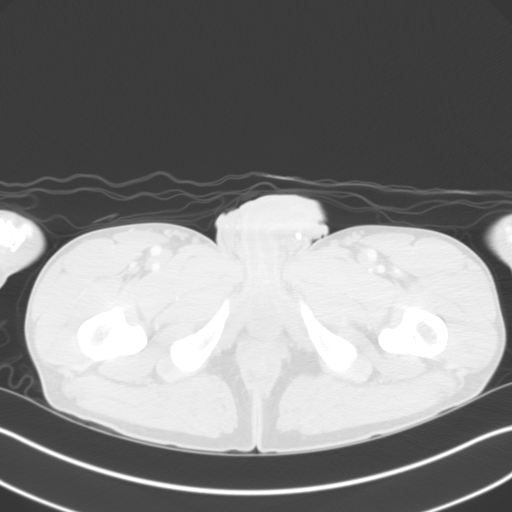
[im 25/134  lung]
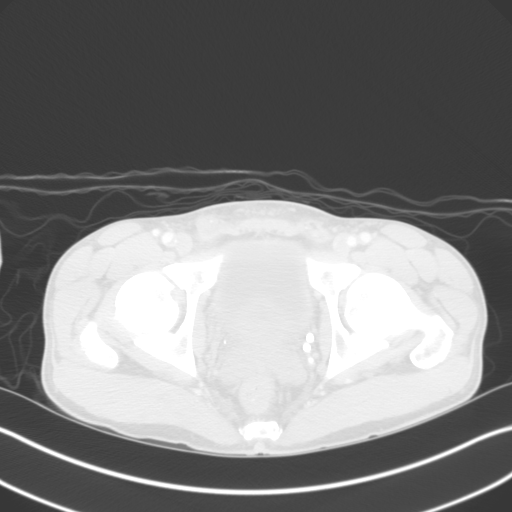
[im 37/134  lung]
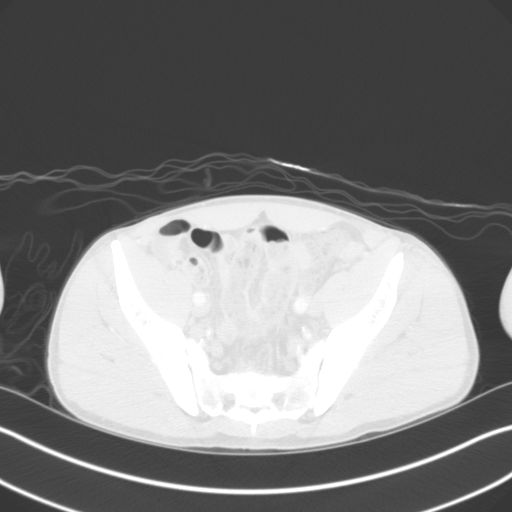
[im 49/134  lung]
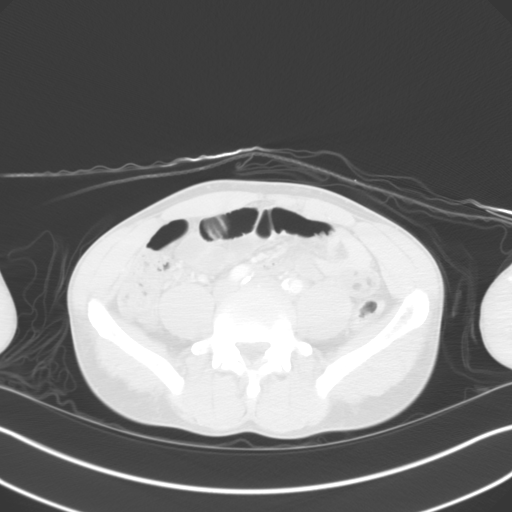
[im 61/134  mediastinal]
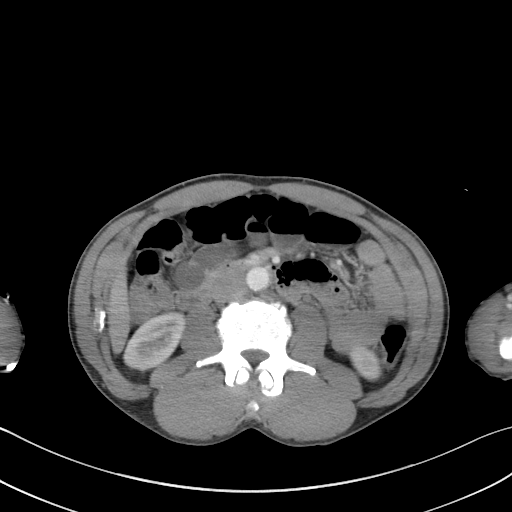
[im 61/134  lung]
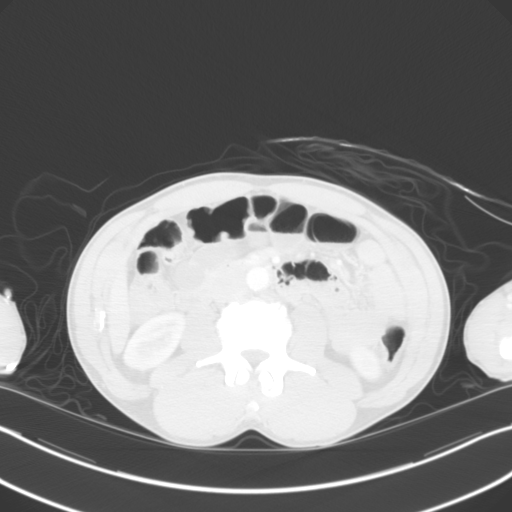
[im 73/134  lung]
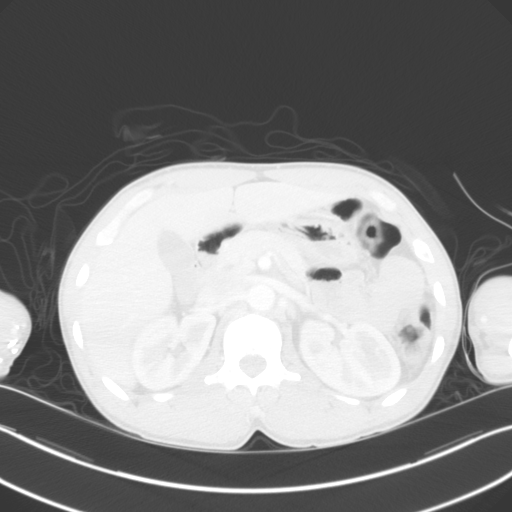
[im 85/134  lung]
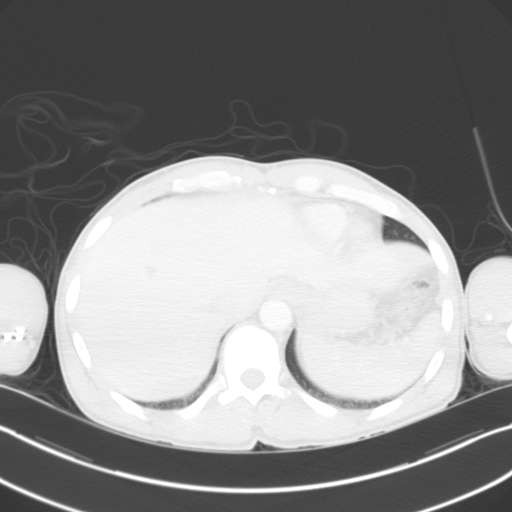
[im 97/134  lung]
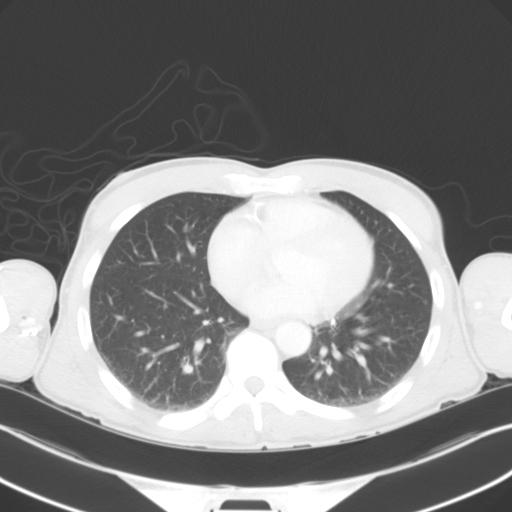
[im 109/134  mediastinal]
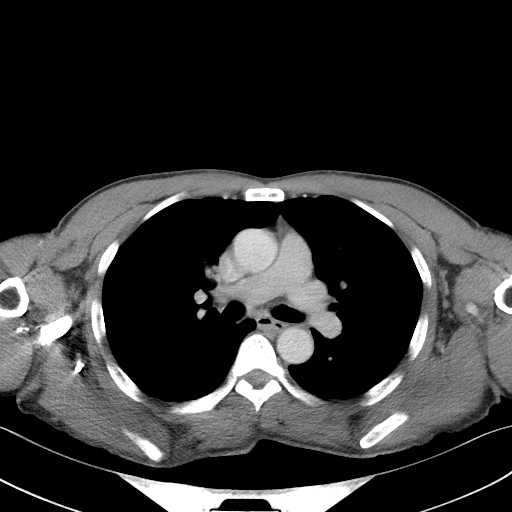
[im 109/134  lung]
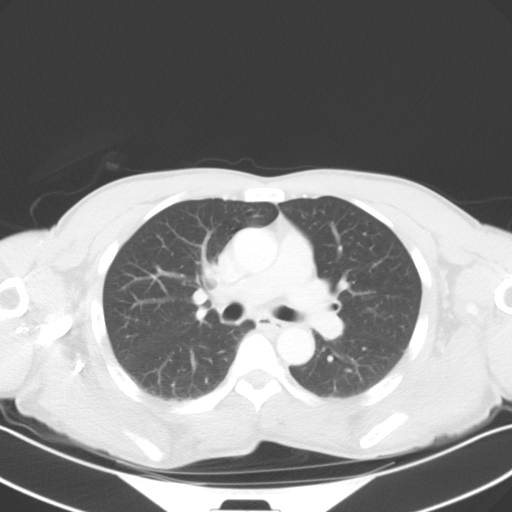
[im 121/134  lung]
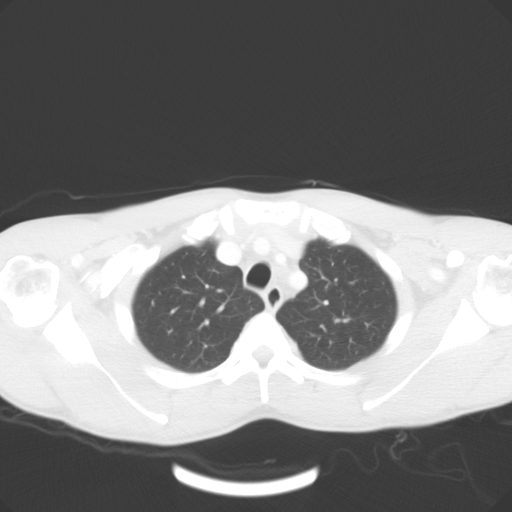

[Series 6: cap with 3mm st cor · coronal · 0.69mm/px · 3 of 141 slices shown]
[im 29/141  lung]
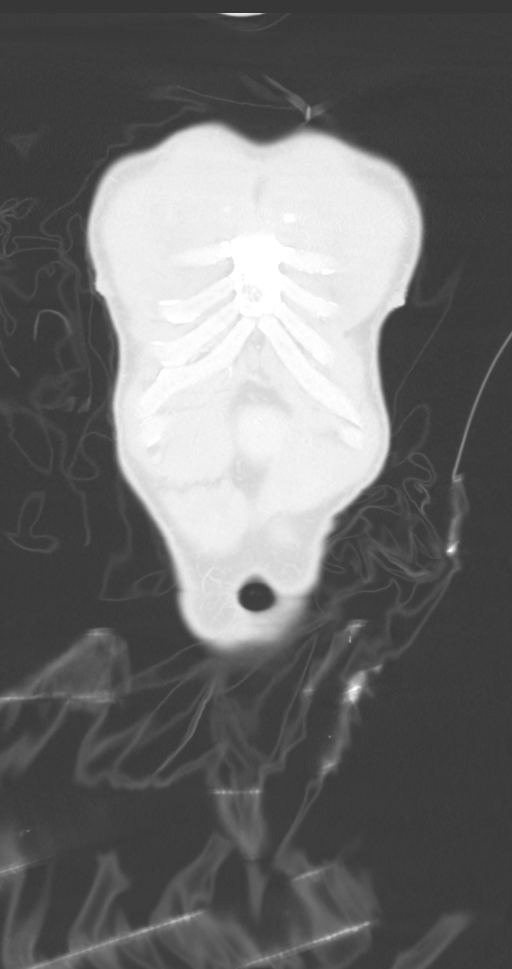
[im 57/141  lung]
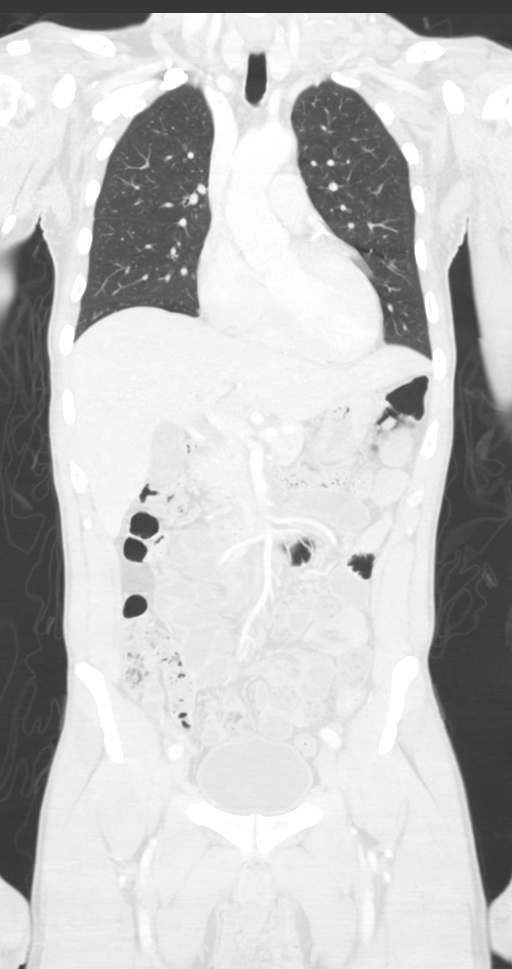
[im 85/141  lung]
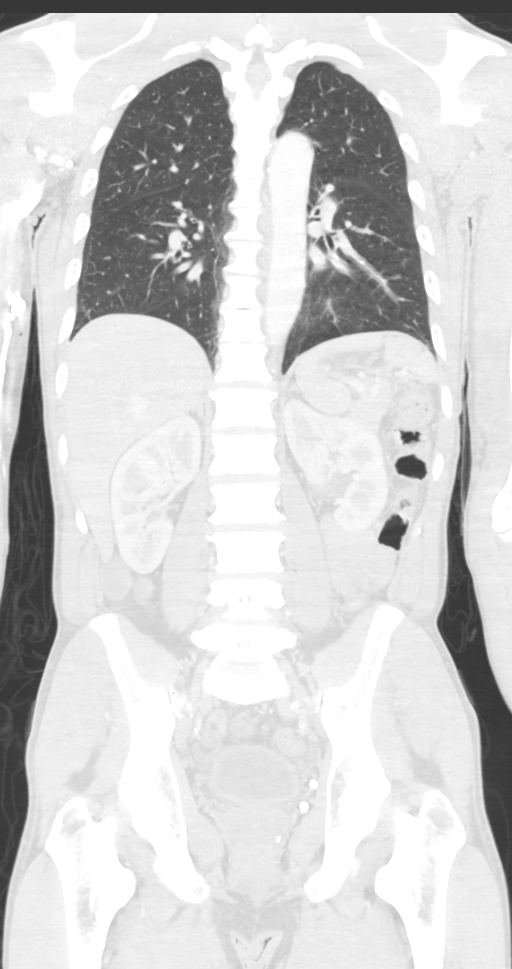

[13 of 36 positions shown; findings below may reference images not displayed]

FINDINGS: CT CHEST FINDINGS

Cardiovascular: No significant vascular findings. Normal heart size.
No pericardial effusion. Calcific aortic and coronary
atherosclerosis noted.

Mediastinum/Nodes: 1.5 cm lymph node in the lower left neck is seen
on image 3 of series 3. There is also a supraclavicular lymph node
on the left measuring 1.3 cm short axis dimension on image 7 series
[DATE] cm in diameter left supraclavicular node is identified on
image 6 of series 3. No axillary, hilar or mediastinal
lymphadenopathy is identified. Esophagus and thyroid gland appear
normal.

Lungs/Pleura: Lungs are clear.  No pleural effusion.

Musculoskeletal: No lytic or sclerotic lesion. No fracture. A few
small Schmorl's nodes in the thoracic spine are noted.

CT ABDOMEN PELVIS FINDINGS

Hepatobiliary: 2.7 cm in diameter hypoattenuating lesion with
peripheral nodular enhancement in the right hepatic lobe on image 54
is consistent with a hemangioma. Two tiny cysts are also identified
on image 49. The liver is otherwise unremarkable. Gallbladder and
biliary tree appear normal.

Pancreas: Unremarkable. No pancreatic ductal dilatation or
surrounding inflammatory changes.

Spleen: Normal in size without focal abnormality.

Adrenals/Urinary Tract: Adrenal glands are unremarkable. Kidneys are
normal, without renal calculi, focal lesion, or hydronephrosis.
Bladder is unremarkable.

Stomach/Bowel: Stomach is within normal limits. Appendix appears
normal. No evidence of bowel wall thickening, distention, or
inflammatory changes.

Vascular/Lymphatic: Aortic atherosclerosis. No enlarged abdominal or
pelvic lymph nodes.

Reproductive: Mild prostatomegaly.

Other: None.

Musculoskeletal: No lytic or sclerotic lesion.  No fracture.
IMPRESSION: Left supraclavicular lymphadenopathy worrisome for
lymphoproliferative disease. No other lymphadenopathy is seen in the
chest, abdomen or pelvis.

Calcific aortic and coronary atherosclerosis.

Benign hemangioma right lobe of the liver.

## 2019-04-18 MED ORDER — SODIUM CHLORIDE 0.9 % IV BOLUS
1000.0000 mL | Freq: Once | INTRAVENOUS | Status: AC
  Administered 2019-04-18: 18:00:00 1000 mL via INTRAVENOUS

## 2019-04-18 MED ORDER — IOHEXOL 300 MG/ML  SOLN
75.0000 mL | Freq: Once | INTRAMUSCULAR | Status: AC | PRN
  Administered 2019-04-18: 20:00:00 75 mL via INTRAVENOUS

## 2019-04-18 MED ORDER — ACETAMINOPHEN 325 MG PO TABS
650.0000 mg | ORAL_TABLET | Freq: Once | ORAL | Status: AC
  Administered 2019-04-18: 17:00:00 650 mg via ORAL
  Filled 2019-04-18: qty 2

## 2019-04-18 NOTE — ED Triage Notes (Signed)
Seen at [facility] in June for left sided neck swelling, thought to be related to teeth, was seen at dentist, dentist told pt it wasn't teeth but referred him to "specialist", pt missed appt and could not be seen.  Swelling worsening, having chills, sweats.  No respiratory or swallowing difficulties.

## 2019-04-18 NOTE — ED Notes (Signed)
Patient verbalizes understanding of discharge instructions. Opportunity for questioning and answers were provided. Armband removed by staff, pt discharged from ED ambulatory.   

## 2019-04-18 NOTE — ED Triage Notes (Signed)
This nurse called Lenn Sink, PA for CT orders.  PA and Harris PA came to triage to assess patient, will be placing orders.

## 2019-04-18 NOTE — ED Provider Notes (Signed)
[facility] EMERGENCY DEPARTMENT Provider Note   CSN: [id number] Arrival date & time: 04/18/19  1532     History   Chief Complaint Chief Complaint  Patient presents with   neck swelling   Fever    HPI Jesse [name] is a 53 y.o. male.     HPI   Pt is a 53 y/o male who presents to the ED today for eval of neck swelling that has been ongoing for a few months but worsened about 1 week ago. States he was on an ibuprofen and it helped with the swelling, but he stopped taking it last week. He denies any significant neck pain. He intermittently has a sore throat. HE has had decreased appetite. He has been able to eat and drink at home. He reports mild SOB that started about 1 week ago. Denies chest pain or cough. Has had intermittent headaches. He reports some upper abd pain that feels like a cramp. Denies NVD, dysuria. He states he has had sweats, chills. He   States his daughter was diagnosed with COVID recently. He does not know if he has had any other exposures. He has been going to church with many people. He has been wearing a mask there.   He reports history of tobacco use many years ago.  States he started in high school.  He has since quit.  Reports strong family history of cancer (mother, sister, father, grandfather).   History reviewed. No pertinent past medical history.  There are no active problems to display for this patient.  History reviewed. No pertinent surgical history.    Home Medications    Prior to Admission medications   Medication Sig Start Date End Date Taking? Authorizing Provider  OVER THE COUNTER MEDICATION Take 1 tablet by mouth as needed (stomach pain). "stomach medication" per pt, does not recall name.   Yes [provider]    Family History Family History  Problem Relation Age of Onset   Cancer Mother     Social History Social History   Tobacco Use   Smoking status: Current Some Day Smoker    Smokeless tobacco: Never Used  Substance Use Topics   Alcohol use: Yes   Drug use: Yes    Types: Marijuana     Allergies   Patient has no known allergies.   Review of Systems Review of Systems  Constitutional: Positive for appetite change, chills and diaphoresis.  HENT: Positive for sore throat.        Neck swelling  Eyes: Negative for visual disturbance.  Respiratory: Positive for shortness of breath. Negative for cough.   Cardiovascular: Negative for chest pain.  Gastrointestinal: Negative for abdominal pain and vomiting.  Genitourinary: Negative for dysuria and hematuria.  Musculoskeletal: Negative for arthralgias and back pain.  Skin: Negative for color change and rash.  Neurological: Negative for seizures and syncope.  All other systems reviewed and are negative.    Physical Exam Updated Vital Signs BP (!) 143/93    Pulse 69    Temp 99 F (37.2 C) (Oral)    Resp 18    SpO2 99%   Physical Exam Vitals signs and nursing note reviewed.  Constitutional:      General: He is not in acute distress.    Appearance: He is well-developed. He is not ill-appearing or toxic-appearing.  HENT:     Head: Normocephalic and atraumatic.     Mouth/Throat:     Mouth: Mucous membranes are moist.  Pharynx: No oropharyngeal exudate or posterior oropharyngeal erythema.     Comments: Tolerating secretions. Normal voice.  Eyes:     Conjunctiva/sclera: Conjunctivae normal.  Neck:     Musculoskeletal: Neck supple.     Comments: Large irregularly shaped mass to the left side of the neck that is mobile and nontender Cardiovascular:     Rate and Rhythm: Normal rate and regular rhythm.     Heart sounds: No murmur.  Pulmonary:     Effort: Pulmonary effort is normal. No respiratory distress.     Breath sounds: Normal breath sounds. No wheezing, rhonchi or rales.  Abdominal:     General: Bowel sounds are normal.     Palpations: Abdomen is soft.     Tenderness: There is abdominal  tenderness in the epigastric area. There is no guarding or rebound.  Lymphadenopathy:     Cervical: Cervical adenopathy (nontender) present.  Skin:    General: Skin is warm and dry.  Neurological:     Mental Status: He is alert.     ED Treatments / Results  Labs (all labs ordered are listed, but only abnormal results are displayed) Labs Reviewed  CBC WITH DIFFERENTIAL/PLATELET - Abnormal; Notable for the following components:      Result Value   Hemoglobin 12.9 (*)    HCT 38.4 (*)    All other components within normal limits  COMPREHENSIVE METABOLIC PANEL - Abnormal; Notable for the following components:   Sodium 133 (*)    Potassium 3.4 (*)    Chloride 97 (*)    All other components within normal limits  URINALYSIS, ROUTINE W REFLEX MICROSCOPIC - Abnormal; Notable for the following components:   Color, Urine STRAW (*)    Specific Gravity, Urine 1.002 (*)    All other components within normal limits  SARS CORONAVIRUS 2 (HOSPITAL ORDER, PERFORMED IN [facility])  GROUP A STREP BY PCR  LIPASE, BLOOD  HIV ANTIBODY (ROUTINE TESTING W REFLEX)    EKG None  Radiology Ct Soft Tissue Neck W Contrast  Result Date: 04/18/2019 CLINICAL DATA:  Enlarged lymph nodes neck and axilla EXAM: CT NECK WITH CONTRAST TECHNIQUE: Multidetector CT imaging of the neck was performed using the standard protocol following the bolus administration of intravenous contrast. CONTRAST:  24mL OMNIPAQUE IOHEXOL 300 MG/ML  SOLN COMPARISON:  None. FINDINGS: Pharynx and larynx: Normal. No mass or swelling. Salivary glands: Diffusely enlarged parotid glands bilaterally. No focal mass. Submandibular glands also mildly enlarged bilaterally. Thyroid: Negative Lymph nodes: Bulky lymphadenopathy left neck. Numerous large lymph nodes. Large left lymph node at the level of the hyoid measures 30 mm. Posterior lymph node at the same level measures 27 mm. Numerous additional enlarged lymph nodes are seen down to  the supraclavicular fossa and left axilla. Scattered small nodes in the right neck without definite pathologic nodes on the right. No enlarged lymph nodes in the right axilla. Vascular: Normal vascular enhancement. Left jugular vein compressed by adenopathy. Limited intracranial: Negative Visualized orbits: Negative Mastoids and visualized paranasal sinuses: Mild mucosal edema left maxillary sinus otherwise clear. Skeleton: Poor dentition with numerous caries Cervical spondylosis, moderate in degree. Upper chest: Chest CT reported separately Other: None IMPRESSION: Bulky lymphadenopathy throughout the left neck and left axilla compatible with neoplasm. This may represent lymphoma or metastatic disease. Small lymph nodes are present in the right neck. No pharyngeal mass identified. Poor dentition Electronically Signed   By: Franchot Gallo M.D.   On: 04/18/2019 20:11   Ct  Chest W Contrast  Result Date: 04/18/2019 CLINICAL DATA:  Several months of enlarged lymph nodes in the left side of the neck and supraclavicular area. Abdominal discomfort. Possible weight loss and recent fever. EXAM: CT CHEST, ABDOMEN, AND PELVIS WITH CONTRAST TECHNIQUE: Multidetector CT imaging of the chest, abdomen and pelvis was performed following the standard protocol during bolus administration of intravenous contrast. CONTRAST:  75 mL OMNIPAQUE IOHEXOL 300 MG/ML  SOLN COMPARISON:  None. FINDINGS: CT CHEST FINDINGS Cardiovascular: No significant vascular findings. Normal heart size. No pericardial effusion. Calcific aortic and coronary atherosclerosis noted. Mediastinum/Nodes: 1.5 cm lymph node in the lower left neck is seen on image 3 of series 3. There is also a supraclavicular lymph node on the left measuring 1.3 cm short axis dimension on image 7 series 3. 2.4 cm in diameter left supraclavicular node is identified on image 6 of series 3. No axillary, hilar or mediastinal lymphadenopathy is identified. Esophagus and thyroid gland appear  normal. Lungs/Pleura: Lungs are clear.  No pleural effusion. Musculoskeletal: No lytic or sclerotic lesion. No fracture. A few small Schmorl's nodes in the thoracic spine are noted. CT ABDOMEN PELVIS FINDINGS Hepatobiliary: 2.7 cm in diameter hypoattenuating lesion with peripheral nodular enhancement in the right hepatic lobe on image 54 is consistent with a hemangioma. Two tiny cysts are also identified on image 49. The liver is otherwise unremarkable. Gallbladder and biliary tree appear normal. Pancreas: Unremarkable. No pancreatic ductal dilatation or surrounding inflammatory changes. Spleen: Normal in size without focal abnormality. Adrenals/Urinary Tract: Adrenal glands are unremarkable. Kidneys are normal, without renal calculi, focal lesion, or hydronephrosis. Bladder is unremarkable. Stomach/Bowel: Stomach is within normal limits. Appendix appears normal. No evidence of bowel wall thickening, distention, or inflammatory changes. Vascular/Lymphatic: Aortic atherosclerosis. No enlarged abdominal or pelvic lymph nodes. Reproductive: Mild prostatomegaly. Other: None. Musculoskeletal: No lytic or sclerotic lesion.  No fracture. IMPRESSION: Left supraclavicular lymphadenopathy worrisome for lymphoproliferative disease. No other lymphadenopathy is seen in the chest, abdomen or pelvis. Calcific aortic and coronary atherosclerosis. Benign hemangioma right lobe of the liver. Electronically Signed   By: Inge Rise M.D.   On: 04/18/2019 19:48   Ct Abdomen Pelvis W Contrast  Result Date: 04/18/2019 CLINICAL DATA:  Several months of enlarged lymph nodes in the left side of the neck and supraclavicular area. Abdominal discomfort. Possible weight loss and recent fever. EXAM: CT CHEST, ABDOMEN, AND PELVIS WITH CONTRAST TECHNIQUE: Multidetector CT imaging of the chest, abdomen and pelvis was performed following the standard protocol during bolus administration of intravenous contrast. CONTRAST:  75 mL OMNIPAQUE  IOHEXOL 300 MG/ML  SOLN COMPARISON:  None. FINDINGS: CT CHEST FINDINGS Cardiovascular: No significant vascular findings. Normal heart size. No pericardial effusion. Calcific aortic and coronary atherosclerosis noted. Mediastinum/Nodes: 1.5 cm lymph node in the lower left neck is seen on image 3 of series 3. There is also a supraclavicular lymph node on the left measuring 1.3 cm short axis dimension on image 7 series 3. 2.4 cm in diameter left supraclavicular node is identified on image 6 of series 3. No axillary, hilar or mediastinal lymphadenopathy is identified. Esophagus and thyroid gland appear normal. Lungs/Pleura: Lungs are clear.  No pleural effusion. Musculoskeletal: No lytic or sclerotic lesion. No fracture. A few small Schmorl's nodes in the thoracic spine are noted. CT ABDOMEN PELVIS FINDINGS Hepatobiliary: 2.7 cm in diameter hypoattenuating lesion with peripheral nodular enhancement in the right hepatic lobe on image 54 is consistent with a hemangioma. Two tiny cysts are also identified on image  49. The liver is otherwise unremarkable. Gallbladder and biliary tree appear normal. Pancreas: Unremarkable. No pancreatic ductal dilatation or surrounding inflammatory changes. Spleen: Normal in size without focal abnormality. Adrenals/Urinary Tract: Adrenal glands are unremarkable. Kidneys are normal, without renal calculi, focal lesion, or hydronephrosis. Bladder is unremarkable. Stomach/Bowel: Stomach is within normal limits. Appendix appears normal. No evidence of bowel wall thickening, distention, or inflammatory changes. Vascular/Lymphatic: Aortic atherosclerosis. No enlarged abdominal or pelvic lymph nodes. Reproductive: Mild prostatomegaly. Other: None. Musculoskeletal: No lytic or sclerotic lesion.  No fracture. IMPRESSION: Left supraclavicular lymphadenopathy worrisome for lymphoproliferative disease. No other lymphadenopathy is seen in the chest, abdomen or pelvis. Calcific aortic and coronary  atherosclerosis. Benign hemangioma right lobe of the liver. Electronically Signed   By: Inge Rise M.D.   On: 04/18/2019 19:48    Procedures Procedures (including critical care time)  Medications Ordered in ED Medications  acetaminophen (TYLENOL) tablet 650 mg (650 mg Oral Given 04/18/19 1637)  sodium chloride 0.9 % bolus 1,000 mL (0 mLs Intravenous Stopped 04/18/19 2113)  iohexol (OMNIPAQUE) 300 MG/ML solution 75 mL (75 mLs Intravenous Contrast Given 04/18/19 1931)     Initial Impression / Assessment and Plan / ED Course  I have reviewed the triage vital signs and the nursing notes.  Pertinent labs & imaging results that were available during my care of the patient were reviewed by me and considered in my medical decision making (see chart for details).    Final Clinical Impressions(s) / ED Diagnoses   Final diagnoses:  Cervical lymphadenopathy   53 year old male presenting for evaluation of left-sided neck swelling which he states has been present for 1 month however on chart review patient has had issues with left-sided cervical lymphadenopathy since February 2020.  Today presents with complaints of feeling generally ill.  He is found to be febrile.  He somewhat hypertensive but the remainder of his vital signs are within normal limits.  He is somewhat short of breath but does not have any other associated URI symptoms.  He has some epigastric abdominal tenderness.  Patient medically screened by prior providers.  Currently awaiting results of labs, COVID testing, CT soft tissue neck, chest/abdomen/pelvis.  Will add strep test.  CBC without leukocytosis, mild anemia present. CMP with mild hyponatremia and high hypochloremia.  Also with mild hypokalemia.  Normal kidney function and LFTs. Lipase negative UA negative HIV in process COVID testing is negative Strep test negative  CT soft tissue neck with bulky lymphadenopathy throughout the left neck and left axilla compatible with  neoplasm. This may represent lymphoma or metastatic disease. Small lymph nodes are present in the right neck. No pharyngeal mass identified.  CT chest/abdomen/pelvis with left supraclavicular lymphadenopathy worrisome for lymphoproliferative disease. No other lymphadenopathy is seen in the chest, abdomen or pelvis. Calcific aortic and coronary atherosclerosis. Benign hemangioma right lobe of the liver.  8:25 PM CONSULT with Dr. Julien Nordmann with oncology who is aware of the patient. He states patient can contact clinic to establish care. He also recommended that pt f/u with ENT for lymph node biopsy.   Reassessed patient.  His wife is at bedside.  I discussed the findings of his CT scans showing concern for possible metastatic disease.  Discussed plan to follow-up with oncology as well as ENT for confirmatory lymph node biopsy.  Also discussed patient's fever.  His work-up today did not show any signs of an obvious bacterial infection.  Fevers may be related to possible metastatic disease versus a viral illness.  Advised him to monitor symptoms and return if worse.  He is well-appearing today and I do not feel that he requires admission.  Opportunity for questions was provided, all questions addressed.  Advised on return precautions.  He and his wife at bedside voiced understanding of plan and reasons return.  Questions answered.  Patient is aware discharge.    ED Discharge Orders    None       Bishop Dublin 04/18/19 2218    Fredia Sorrow, MD 04/18/19 [phone]

## 2019-04-18 NOTE — Discharge Instructions (Signed)
Today the CT scan of your neck showed a swollen lymph nodes in your neck that were concerning for the possibility of cancer.  You will need to have a biopsy of your lymph nodes to confirm this.  You were given information to follow-up with a cancer doctor (Dr. Earlie Server) as well as with an ear nose and throat doctor (Dr. Blenda Nicely).  You will need to call these offices to schedule an appointment for follow-up.  The remainder of your work-up today was reassuring.  You will need to monitor your symptoms closely.  If you have any persistent fevers or any new or worsening symptoms and he should return to the emergency department immediately.

## 2019-04-18 NOTE — ED Provider Notes (Signed)
  53 year old male presents today with neck swelling.  Patient seen in triage for initial screening was had several months of enlarging lymph nodes in his left neck and supraclavicular region.  Patient notes he has been taking ibuprofen which makes lymph nodes slightly smaller, notes that he has had abdominal discomfort recently questionable weight loss, and recent fever.  He denies any difficulty swallowing or significant throat pain.  Vitals:   04/18/19 1550  BP: (!) 140/92  Pulse: 93  Resp: 18  Temp: (!) 102.5 F (39.2 C)  SpO2: 100%     Okey Regal, PA-C 04/18/19 [place], DO 04/18/19 2050

## 2019-04-19 ENCOUNTER — Telehealth: Payer: Self-pay | Admitting: Internal Medicine

## 2019-04-19 ENCOUNTER — Telehealth: Payer: Self-pay | Admitting: *Deleted

## 2019-04-19 ENCOUNTER — Encounter: Payer: Self-pay | Admitting: *Deleted

## 2019-04-19 ENCOUNTER — Encounter: Payer: Self-pay | Admitting: Internal Medicine

## 2019-04-19 LAB — HIV ANTIBODY (ROUTINE TESTING W REFLEX): HIV Screen 4th Generation wRfx: NONREACTIVE

## 2019-04-19 NOTE — Telephone Encounter (Signed)
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  This needs to be seen with Dr. Salem Senate and Rad Onc.  Thanks Hinton Dyer

## 2019-04-19 NOTE — Telephone Encounter (Signed)
I called and confirmed appt with Jesse [name] wife to see Dr. Julien Nordmann on 9/8 at 2:15pm

## 2019-04-19 NOTE — Telephone Encounter (Signed)
"  Lemmie Evens Senne's wife Nickie Retort [phone]).  Please call me with appointment information.   Yesterday he was seen in ED.  Instructed to call to schedule appointment with Dr. Julien Nordmann and ENT.   ENT appointment scheduled 04-24-2019 at 4:00 pm; he needs a biopsy.  Does he need to see Dr. Julien Nordmann before the biopsy?   Will be calling DSS next.  He does not have insurance so needs Medicaid or something."  FYI: Congratulated on their 03-24-2019 marriage.  "Waiting for marriage license to change name."

## 2019-04-19 NOTE — Telephone Encounter (Signed)
Received a staff msg from Dr. Julien Nordmann to schedule an oncology appt fro Jesse [name] for possible lymphoma. Jesse [name] has been scheduled to see Dr. Julien Nordmann on 9/8 at 2:15pm w/labs at 1:45pm. I left the pt's wife a vm with the appt date and time. Letter mailed.

## 2019-04-20 ENCOUNTER — Other Ambulatory Visit: Payer: Self-pay | Admitting: *Deleted

## 2019-04-20 DIAGNOSIS — R590 Localized enlarged lymph nodes: Secondary | ICD-10-CM

## 2019-04-24 ENCOUNTER — Encounter: Payer: Self-pay | Admitting: *Deleted

## 2019-04-24 ENCOUNTER — Other Ambulatory Visit: Payer: Self-pay

## 2019-04-24 ENCOUNTER — Inpatient Hospital Stay: Payer: Medicaid Other | Attending: Internal Medicine | Admitting: Internal Medicine

## 2019-04-24 ENCOUNTER — Inpatient Hospital Stay: Payer: Medicaid Other

## 2019-04-24 ENCOUNTER — Encounter: Payer: Self-pay | Admitting: Internal Medicine

## 2019-04-24 DIAGNOSIS — R600 Localized edema: Secondary | ICD-10-CM | POA: Diagnosis not present

## 2019-04-24 DIAGNOSIS — Z801 Family history of malignant neoplasm of trachea, bronchus and lung: Secondary | ICD-10-CM | POA: Diagnosis not present

## 2019-04-24 DIAGNOSIS — N400 Enlarged prostate without lower urinary tract symptoms: Secondary | ICD-10-CM | POA: Diagnosis not present

## 2019-04-24 DIAGNOSIS — Z7289 Other problems related to lifestyle: Secondary | ICD-10-CM | POA: Diagnosis not present

## 2019-04-24 DIAGNOSIS — Z87891 Personal history of nicotine dependence: Secondary | ICD-10-CM | POA: Diagnosis not present

## 2019-04-24 DIAGNOSIS — C8191 Hodgkin lymphoma, unspecified, lymph nodes of head, face, and neck: Secondary | ICD-10-CM | POA: Diagnosis not present

## 2019-04-24 DIAGNOSIS — F1290 Cannabis use, unspecified, uncomplicated: Secondary | ICD-10-CM | POA: Diagnosis not present

## 2019-04-24 DIAGNOSIS — Z8042 Family history of malignant neoplasm of prostate: Secondary | ICD-10-CM | POA: Insufficient documentation

## 2019-04-24 DIAGNOSIS — R5383 Other fatigue: Secondary | ICD-10-CM | POA: Diagnosis not present

## 2019-04-24 DIAGNOSIS — M542 Cervicalgia: Secondary | ICD-10-CM | POA: Insufficient documentation

## 2019-04-24 DIAGNOSIS — M47812 Spondylosis without myelopathy or radiculopathy, cervical region: Secondary | ICD-10-CM | POA: Insufficient documentation

## 2019-04-24 DIAGNOSIS — K029 Dental caries, unspecified: Secondary | ICD-10-CM | POA: Diagnosis not present

## 2019-04-24 DIAGNOSIS — R61 Generalized hyperhidrosis: Secondary | ICD-10-CM | POA: Insufficient documentation

## 2019-04-24 DIAGNOSIS — R590 Localized enlarged lymph nodes: Secondary | ICD-10-CM

## 2019-04-24 DIAGNOSIS — H538 Other visual disturbances: Secondary | ICD-10-CM | POA: Diagnosis not present

## 2019-04-24 DIAGNOSIS — R509 Fever, unspecified: Secondary | ICD-10-CM | POA: Insufficient documentation

## 2019-04-24 DIAGNOSIS — K111 Hypertrophy of salivary gland: Secondary | ICD-10-CM | POA: Diagnosis not present

## 2019-04-24 DIAGNOSIS — M5144 Schmorl's nodes, thoracic region: Secondary | ICD-10-CM | POA: Diagnosis not present

## 2019-04-24 DIAGNOSIS — Z803 Family history of malignant neoplasm of breast: Secondary | ICD-10-CM | POA: Diagnosis not present

## 2019-04-24 DIAGNOSIS — I700 Atherosclerosis of aorta: Secondary | ICD-10-CM | POA: Diagnosis not present

## 2019-04-24 DIAGNOSIS — R51 Headache: Secondary | ICD-10-CM | POA: Diagnosis not present

## 2019-04-24 DIAGNOSIS — Z79899 Other long term (current) drug therapy: Secondary | ICD-10-CM | POA: Diagnosis not present

## 2019-04-24 LAB — CBC WITH DIFFERENTIAL (CANCER CENTER ONLY)
Abs Immature Granulocytes: 0.01 10*3/uL (ref 0.00–0.07)
Basophils Absolute: 0 10*3/uL (ref 0.0–0.1)
Basophils Relative: 0 %
Eosinophils Absolute: 0 10*3/uL (ref 0.0–0.5)
Eosinophils Relative: 1 %
HCT: 35.6 % — ABNORMAL LOW (ref 39.0–52.0)
Hemoglobin: 11.9 g/dL — ABNORMAL LOW (ref 13.0–17.0)
Immature Granulocytes: 0 %
Lymphocytes Relative: 31 %
Lymphs Abs: 1.4 10*3/uL (ref 0.7–4.0)
MCH: 29.6 pg (ref 26.0–34.0)
MCHC: 33.4 g/dL (ref 30.0–36.0)
MCV: 88.6 fL (ref 80.0–100.0)
Monocytes Absolute: 1.5 10*3/uL — ABNORMAL HIGH (ref 0.1–1.0)
Monocytes Relative: 32 %
Neutro Abs: 1.7 10*3/uL (ref 1.7–7.7)
Neutrophils Relative %: 36 %
Platelet Count: 276 10*3/uL (ref 150–400)
RBC: 4.02 MIL/uL — ABNORMAL LOW (ref 4.22–5.81)
RDW: 12.9 % (ref 11.5–15.5)
WBC Count: 4.7 10*3/uL (ref 4.0–10.5)
nRBC: 0 % (ref 0.0–0.2)

## 2019-04-24 LAB — CMP (CANCER CENTER ONLY)
ALT: 15 U/L (ref 0–44)
AST: 17 U/L (ref 15–41)
Albumin: 3.3 g/dL — ABNORMAL LOW (ref 3.5–5.0)
Alkaline Phosphatase: 115 U/L (ref 38–126)
Anion gap: 9 (ref 5–15)
BUN: 11 mg/dL (ref 6–20)
CO2: 26 mmol/L (ref 22–32)
Calcium: 8.6 mg/dL — ABNORMAL LOW (ref 8.9–10.3)
Chloride: 99 mmol/L (ref 98–111)
Creatinine: 0.91 mg/dL (ref 0.61–1.24)
GFR, Est AFR Am: >60 mL/min (ref >60)
GFR, Estimated: >60 mL/min (ref >60)
Glucose, Bld: 76 mg/dL (ref 70–99)
Potassium: 3.6 mmol/L (ref 3.5–5.1)
Sodium: 134 mmol/L — ABNORMAL LOW (ref 135–145)
Total Bilirubin: 0.5 mg/dL (ref 0.3–1.2)
Total Protein: 7.5 g/dL (ref 6.5–8.1)

## 2019-04-24 NOTE — Progress Notes (Signed)
[place] [facility] [phone]   [phone]  CONSULT NOTE  REFERRING PHYSICIAN:Cortni Courture, PA-C  REASON FOR CONSULTATION:  53 years old African-American male with left neck lymphadenopathy.  HPI [name] is a 53 y.o. male with no significant past medical history but history of smoking for around 20 years and quit 20 years ago.  He also has a current history of alcohol abuse and marijuana smoking.  The patient mentioned that he presented to the emergency department last week complaining of 2 months duration of left neck swelling and low-grade fever.  His daughter was tested positive for COVID 19.  During his visit to the emergency department he was tested for Dana and that was negative.  The patient had CT scan of the neck on 04/18/2019 and it showed bulky lymphadenopathy throughout the left neck and left axilla compatible with neoplasm.  This may represent lymphoma or metastatic disease.  Small lymph nodes were present in the right neck but no pharyngeal mass identified.  CT of the chest, abdomen pelvis performed on the same day showed left supraclavicular lymphadenopathy worrisome for lymphoproliferative disease.  No other lymphadenopathy is seen in the chest, abdomen or pelvis.  There was benign hemangioma in the right lobe of the liver. The patient was referred to me today for evaluation and recommendation regarding his condition. When seen today he is feeling fine except for the left neck swelling.  He denied having any current chest pain, shortness of breath, cough or hemoptysis.  He has no weight loss but has night sweats.  He also complains of headache and blurry vision.  He denied having any nausea, vomiting, diarrhea or constipation. Past medical history is unremarkable. Family history mother had lung cancer, father had prostate cancer and sister had breast cancer. The patient is married and has 5 children.  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  The patient has a history for smoking for around 20 years and quit 20 years ago.  He drinks up to 12 bottles of corona every day.  He also smokes marijuana on daily basis.  HPI  No past medical history on file.  No past surgical history on file.  Family History  Problem Relation Age of Onset   Cancer Mother    Cancer Father    Cancer Sister     Social History Social History   Tobacco Use   Smoking status: Former Smoker    Packs/day: 1.00    Years: 20.00    Pack years: 20.00    Quit date: 04/24/1999    Years since quitting: 20.0   Smokeless tobacco: Never Used  Substance Use Topics   Alcohol use: Yes   Drug use: Yes    Types: Marijuana    No Known Allergies  Current Outpatient Medications  Medication Sig Dispense Refill   OVER THE COUNTER MEDICATION Take 1 tablet by mouth as needed (stomach pain). "stomach medication" per pt, does not recall name.     No current facility-administered medications for this visit.     Review of Systems  Constitutional: positive for fatigue and night sweats Eyes: negative Ears, nose, mouth, throat, and face: negative Respiratory: negative Cardiovascular: negative Gastrointestinal: negative Genitourinary:negative Integument/breast: negative Hematologic/lymphatic: negative Musculoskeletal:positive for neck pain Neurological: negative Behavioral/Psych: negative Endocrine: negative Allergic/Immunologic: negative  Physical Exam  [id number], healthy, no distress, well nourished, well developed and anxious SKIN: skin color, texture, turgor are normal, no rashes or significant  lesions HEAD: Normocephalic, No masses, lesions, tenderness or abnormalities EYES: normal, PERRLA, Conjunctiva are pink and non-injected EARS: External ears normal, Canals clear OROPHARYNX:no exudate, no erythema and lips, buccal mucosa, and tongue normal  NECK: supple, multiple enlarged left  supraclavicular and lower cervical lymphadenopathy LYMPH:  enlarged lymph nodes palpated  multiple enlarged left supraclavicular and lower cervical lymphadenopathy LUNGS: clear to auscultation , and palpation HEART: regular rate & rhythm, no murmurs and no gallops ABDOMEN:abdomen soft, non-tender, normal bowel sounds and no masses or organomegaly BACK: Back symmetric, no curvature., No CVA tenderness EXTREMITIES:no joint deformities, effusion, or inflammation, no edema  NEURO: alert & oriented x 3 with fluent speech, no focal motor/sensory deficits  PERFORMANCE STATUS: ECOG 1  LABORATORY DATA: Lab Results  Component Value Date   WBC 4.7 04/24/2019   HGB 11.9 (L) 04/24/2019   HCT 35.6 (L) 04/24/2019   MCV 88.6 04/24/2019   PLT 276 04/24/2019      Chemistry      Component Value Date/Time   NA 134 (L) 04/24/2019 1351   K 3.6 04/24/2019 1351   CL 99 04/24/2019 1351   CO2 26 04/24/2019 1351   BUN 11 04/24/2019 1351   CREATININE 0.91 04/24/2019 1351      Component Value Date/Time   CALCIUM 8.6 (L) 04/24/2019 1351   ALKPHOS 115 04/24/2019 1351   AST 17 04/24/2019 1351   ALT 15 04/24/2019 1351   BILITOT 0.5 04/24/2019 1351       RADIOGRAPHIC STUDIES: Ct Soft Tissue Neck W Contrast  Result Date: 04/18/2019 CLINICAL DATA:  Enlarged lymph nodes neck and axilla EXAM: CT NECK WITH CONTRAST TECHNIQUE: Multidetector CT imaging of the neck was performed using the standard protocol following the bolus administration of intravenous contrast. CONTRAST:  103mL OMNIPAQUE IOHEXOL 300 MG/ML  SOLN COMPARISON:  None. FINDINGS: Pharynx and larynx: Normal. No mass or swelling. Salivary glands: Diffusely enlarged parotid glands bilaterally. No focal mass. Submandibular glands also mildly enlarged bilaterally. Thyroid: Negative Lymph nodes: Bulky lymphadenopathy left neck. Numerous large lymph nodes. Large left lymph node at the level of the hyoid measures 30 mm. Posterior lymph node at the same level  measures 27 mm. Numerous additional enlarged lymph nodes are seen down to the supraclavicular fossa and left axilla. Scattered small nodes in the right neck without definite pathologic nodes on the right. No enlarged lymph nodes in the right axilla. Vascular: Normal vascular enhancement. Left jugular vein compressed by adenopathy. Limited intracranial: Negative Visualized orbits: Negative Mastoids and visualized paranasal sinuses: Mild mucosal edema left maxillary sinus otherwise clear. Skeleton: Poor dentition with numerous caries Cervical spondylosis, moderate in degree. Upper chest: Chest CT reported separately Other: None IMPRESSION: Bulky lymphadenopathy throughout the left neck and left axilla compatible with neoplasm. This may represent lymphoma or metastatic disease. Small lymph nodes are present in the right neck. No pharyngeal mass identified. Poor dentition Electronically Signed   By: Franchot Gallo M.D.   On: 04/18/2019 20:11   Ct Chest W Contrast  Result Date: 04/18/2019 CLINICAL DATA:  Several months of enlarged lymph nodes in the left side of the neck and supraclavicular area. Abdominal discomfort. Possible weight loss and recent fever. EXAM: CT CHEST, ABDOMEN, AND PELVIS WITH CONTRAST TECHNIQUE: Multidetector CT imaging of the chest, abdomen and pelvis was performed following the standard protocol during bolus administration of intravenous contrast. CONTRAST:  75 mL OMNIPAQUE IOHEXOL 300 MG/ML  SOLN COMPARISON:  None. FINDINGS: CT CHEST FINDINGS Cardiovascular: No significant vascular findings. Normal heart  size. No pericardial effusion. Calcific aortic and coronary atherosclerosis noted. Mediastinum/Nodes: 1.5 cm lymph node in the lower left neck is seen on image 3 of series 3. There is also a supraclavicular lymph node on the left measuring 1.3 cm short axis dimension on image 7 series 3. 2.4 cm in diameter left supraclavicular node is identified on image 6 of series 3. No axillary, hilar or  mediastinal lymphadenopathy is identified. Esophagus and thyroid gland appear normal. Lungs/Pleura: Lungs are clear.  No pleural effusion. Musculoskeletal: No lytic or sclerotic lesion. No fracture. A few small Schmorl's nodes in the thoracic spine are noted. CT ABDOMEN PELVIS FINDINGS Hepatobiliary: 2.7 cm in diameter hypoattenuating lesion with peripheral nodular enhancement in the right hepatic lobe on image 54 is consistent with a hemangioma. Two tiny cysts are also identified on image 49. The liver is otherwise unremarkable. Gallbladder and biliary tree appear normal. Pancreas: Unremarkable. No pancreatic ductal dilatation or surrounding inflammatory changes. Spleen: Normal in size without focal abnormality. Adrenals/Urinary Tract: Adrenal glands are unremarkable. Kidneys are normal, without renal calculi, focal lesion, or hydronephrosis. Bladder is unremarkable. Stomach/Bowel: Stomach is within normal limits. Appendix appears normal. No evidence of bowel wall thickening, distention, or inflammatory changes. Vascular/Lymphatic: Aortic atherosclerosis. No enlarged abdominal or pelvic lymph nodes. Reproductive: Mild prostatomegaly. Other: None. Musculoskeletal: No lytic or sclerotic lesion.  No fracture. IMPRESSION: Left supraclavicular lymphadenopathy worrisome for lymphoproliferative disease. No other lymphadenopathy is seen in the chest, abdomen or pelvis. Calcific aortic and coronary atherosclerosis. Benign hemangioma right lobe of the liver. Electronically Signed   By: Inge Rise M.D.   On: 04/18/2019 19:48   Ct Abdomen Pelvis W Contrast  Result Date: 04/18/2019 CLINICAL DATA:  Several months of enlarged lymph nodes in the left side of the neck and supraclavicular area. Abdominal discomfort. Possible weight loss and recent fever. EXAM: CT CHEST, ABDOMEN, AND PELVIS WITH CONTRAST TECHNIQUE: Multidetector CT imaging of the chest, abdomen and pelvis was performed following the standard protocol during  bolus administration of intravenous contrast. CONTRAST:  75 mL OMNIPAQUE IOHEXOL 300 MG/ML  SOLN COMPARISON:  None. FINDINGS: CT CHEST FINDINGS Cardiovascular: No significant vascular findings. Normal heart size. No pericardial effusion. Calcific aortic and coronary atherosclerosis noted. Mediastinum/Nodes: 1.5 cm lymph node in the lower left neck is seen on image 3 of series 3. There is also a supraclavicular lymph node on the left measuring 1.3 cm short axis dimension on image 7 series 3. 2.4 cm in diameter left supraclavicular node is identified on image 6 of series 3. No axillary, hilar or mediastinal lymphadenopathy is identified. Esophagus and thyroid gland appear normal. Lungs/Pleura: Lungs are clear.  No pleural effusion. Musculoskeletal: No lytic or sclerotic lesion. No fracture. A few small Schmorl's nodes in the thoracic spine are noted. CT ABDOMEN PELVIS FINDINGS Hepatobiliary: 2.7 cm in diameter hypoattenuating lesion with peripheral nodular enhancement in the right hepatic lobe on image 54 is consistent with a hemangioma. Two tiny cysts are also identified on image 49. The liver is otherwise unremarkable. Gallbladder and biliary tree appear normal. Pancreas: Unremarkable. No pancreatic ductal dilatation or surrounding inflammatory changes. Spleen: Normal in size without focal abnormality. Adrenals/Urinary Tract: Adrenal glands are unremarkable. Kidneys are normal, without renal calculi, focal lesion, or hydronephrosis. Bladder is unremarkable. Stomach/Bowel: Stomach is within normal limits. Appendix appears normal. No evidence of bowel wall thickening, distention, or inflammatory changes. Vascular/Lymphatic: Aortic atherosclerosis. No enlarged abdominal or pelvic lymph nodes. Reproductive: Mild prostatomegaly. Other: None. Musculoskeletal: No lytic or sclerotic lesion.  No fracture. IMPRESSION: Left supraclavicular lymphadenopathy worrisome for lymphoproliferative disease. No other lymphadenopathy is  seen in the chest, abdomen or pelvis. Calcific aortic and coronary atherosclerosis. Benign hemangioma right lobe of the liver. Electronically Signed   By: Inge Rise M.D.   On: 04/18/2019 19:48    ASSESSMENT: This is a very pleasant 53 years old African-American male presented with left supraclavicular and lower cervical lymphadenopathy as well as suspicious lymphadenopathy in the right supraclavicular and left axilla concerning for lymphoproliferative disorder.   PLAN: I had a lengthy discussion with the patient today about his current condition and further investigation to confirm diagnosis. I personally and independently reviewed the scan images and discussed the results with the patient today. I recommended for the patient to have core biopsy or excisional biopsy of 1 of the left supraclavicular lymphadenopathy for confirmation of tissue diagnosis.  Fine-needle aspiration will not be enough for further studies if the patient has a lymphoproliferative disorder. He is scheduled to see Dr. Redmond Baseman with [facility] later today for evaluation of his condition.  We definitely would prefer an excisional biopsy for his tissue diagnosis after he sees Dr. Redmond Baseman. I will see the patient back for follow-up visit in 1 week and if the final diagnosis confirmed malignancy, I may consider the patient for a PET scan and further evaluation of his condition. He was strongly advised about cutting on the amount of alcohol he drinks regular basis. He was advised to call immediately if he has any other concerning symptoms in the interval. The patient voices understanding of current disease status and treatment options and is in agreement with the current care plan.  All questions were answered. The patient knows to call the clinic with any problems, questions or concerns. We can certainly see the patient much sooner if necessary.  Thank you so much for allowing me to participate in the care of  Jesse [name]. I will continue to follow up the patient with you and assist in his care.  I spent 40 minutes counseling the patient face to face. The total time spent in the appointment was 60 minutes.  Disclaimer: This note was dictated with voice recognition software. Similar sounding words can inadvertently be transcribed and may not be corrected upon review.   Eilleen Kempf April 24, 2019, 2:40 PM

## 2019-04-25 ENCOUNTER — Telehealth: Payer: Self-pay | Admitting: Internal Medicine

## 2019-04-25 NOTE — Telephone Encounter (Signed)
Scheduled appt per 9/8 los - unable to reach pt . Called both numbers and unable to leave message. - mailed letter with appt date and time

## 2019-05-03 ENCOUNTER — Telehealth: Payer: Self-pay | Admitting: Physician Assistant

## 2019-05-03 ENCOUNTER — Telehealth: Payer: Self-pay | Admitting: *Deleted

## 2019-05-03 ENCOUNTER — Inpatient Hospital Stay: Payer: Medicaid Other | Admitting: Physician Assistant

## 2019-05-03 NOTE — Telephone Encounter (Signed)
Scheduled apt per 9/17 sch message - pt aware of appt date and time    Also called wife to let let her know per pt request - she is aware

## 2019-05-03 NOTE — Telephone Encounter (Signed)
Confirmed with patient that his biopsy hasn't been scheduled yet by Dr Redmond Baseman office.  Left message for Dr. Redmond Baseman medical assistant to reach out to patient to coordinate scheduling.  Today's visit at [facility] will be postponed since biopsy hasn't been completed yet.  Advised patient that Dr. Redmond Baseman office should be contacting him soon.  Our office will contact him to reschedule today's visit.  He requested that same information be relayed to his wife.    Upon speaking with wife she advised that patient is actually uninsured and Dr. Redmond Baseman office is requiring payment and they are trying to coordinate with another office that would make the procedure more affordable since they are paying out of pocket.  She will continue to follow with Dr Redmond Baseman office to see if another provider is available to help.  Dr. Julien Nordmann and Cassandra Heilingoetter, PA advised of delay in patients treatment.

## 2019-05-03 NOTE — Telephone Encounter (Signed)
We can order US guided core biopsy by IR next week.

## 2019-05-04 ENCOUNTER — Other Ambulatory Visit (HOSPITAL_COMMUNITY)
Admission: RE | Admit: 2019-05-04 | Discharge: 2019-05-04 | Disposition: A | Discharge status: Home / Self Care | Payer: HRSA Program | Source: Ambulatory Visit | Attending: Otolaryngology | Admitting: Otolaryngology

## 2019-05-04 ENCOUNTER — Other Ambulatory Visit: Payer: Self-pay

## 2019-05-04 ENCOUNTER — Encounter (HOSPITAL_BASED_OUTPATIENT_CLINIC_OR_DEPARTMENT_OTHER): Payer: Self-pay | Admitting: *Deleted

## 2019-05-04 DIAGNOSIS — R591 Generalized enlarged lymph nodes: Secondary | ICD-10-CM | POA: Insufficient documentation

## 2019-05-04 DIAGNOSIS — Z01812 Encounter for preprocedural laboratory examination: Secondary | ICD-10-CM | POA: Diagnosis present

## 2019-05-04 DIAGNOSIS — Z20828 Contact with and (suspected) exposure to other viral communicable diseases: Secondary | ICD-10-CM | POA: Insufficient documentation

## 2019-05-04 LAB — SARS CORONAVIRUS 2 (TAT 6-24 HRS): SARS Coronavirus 2: NEGATIVE

## 2019-05-07 ENCOUNTER — Encounter (HOSPITAL_BASED_OUTPATIENT_CLINIC_OR_DEPARTMENT_OTHER)
Admission: RE | Disposition: A | Discharge status: Home / Self Care | Payer: Self-pay | Source: Home / Self Care | Attending: Otolaryngology

## 2019-05-07 ENCOUNTER — Ambulatory Visit (HOSPITAL_BASED_OUTPATIENT_CLINIC_OR_DEPARTMENT_OTHER): Payer: Medicaid Other | Admitting: Certified Registered"

## 2019-05-07 ENCOUNTER — Other Ambulatory Visit: Payer: Self-pay

## 2019-05-07 ENCOUNTER — Ambulatory Visit (HOSPITAL_BASED_OUTPATIENT_CLINIC_OR_DEPARTMENT_OTHER)
Admission: RE | Admit: 2019-05-07 | Discharge: 2019-05-07 | Disposition: A | Discharge status: Home / Self Care | Payer: Medicaid Other | Attending: Otolaryngology | Admitting: Otolaryngology

## 2019-05-07 ENCOUNTER — Encounter (HOSPITAL_BASED_OUTPATIENT_CLINIC_OR_DEPARTMENT_OTHER): Payer: Self-pay | Admitting: Certified Registered Nurse Anesthetist

## 2019-05-07 DIAGNOSIS — C8111 Nodular sclerosis classical Hodgkin lymphoma, lymph nodes of head, face, and neck: Secondary | ICD-10-CM | POA: Insufficient documentation

## 2019-05-07 DIAGNOSIS — Z87891 Personal history of nicotine dependence: Secondary | ICD-10-CM | POA: Insufficient documentation

## 2019-05-07 HISTORY — DX: Generalized enlarged lymph nodes: R59.1

## 2019-05-07 HISTORY — DX: Alcohol abuse, uncomplicated: F10.10

## 2019-05-07 HISTORY — PX: LYMPH NODE BIOPSY: SHX201

## 2019-05-07 HISTORY — DX: Cannabis abuse, uncomplicated: F12.10

## 2019-05-07 SURGERY — LYMPH NODE BIOPSY
Anesthesia: General | Site: Neck | Laterality: Left

## 2019-05-07 MED ORDER — CELECOXIB 200 MG PO CAPS
ORAL_CAPSULE | ORAL | Status: AC
  Filled 2019-05-07: qty 2

## 2019-05-07 MED ORDER — MIDAZOLAM HCL 2 MG/2ML IJ SOLN: 1.0000 mg | INTRAMUSCULAR | Status: DC | PRN

## 2019-05-07 MED ORDER — ONDANSETRON HCL 4 MG/2ML IJ SOLN
INTRAMUSCULAR | Status: DC | PRN
  Administered 2019-05-07: 4 mg via INTRAVENOUS

## 2019-05-07 MED ORDER — PROPOFOL 10 MG/ML IV BOLUS
INTRAVENOUS | Status: DC | PRN
  Administered 2019-05-07: 200 mg via INTRAVENOUS

## 2019-05-07 MED ORDER — ARTIFICIAL TEARS OPHTHALMIC OINT
TOPICAL_OINTMENT | OPHTHALMIC | Status: DC | PRN
  Administered 2019-05-07: 1 via OPHTHALMIC

## 2019-05-07 MED ORDER — SCOPOLAMINE 1 MG/3DAYS TD PT72: 1.0000 | MEDICATED_PATCH | Freq: Once | TRANSDERMAL | Status: DC

## 2019-05-07 MED ORDER — ONDANSETRON HCL 4 MG/2ML IJ SOLN
INTRAMUSCULAR | Status: AC
  Filled 2019-05-07: qty 2

## 2019-05-07 MED ORDER — LIDOCAINE HCL (CARDIAC) PF 100 MG/5ML IV SOSY
PREFILLED_SYRINGE | INTRAVENOUS | Status: DC | PRN
  Administered 2019-05-07: 80 mg via INTRATRACHEAL

## 2019-05-07 MED ORDER — SUCCINYLCHOLINE CHLORIDE 20 MG/ML IJ SOLN
INTRAMUSCULAR | Status: DC | PRN
  Administered 2019-05-07: 120 mg via INTRAVENOUS

## 2019-05-07 MED ORDER — LACTATED RINGERS IV SOLN
INTRAVENOUS | Status: DC
  Administered 2019-05-07 (×2): via INTRAVENOUS

## 2019-05-07 MED ORDER — ACETAMINOPHEN 500 MG PO TABS
1000.0000 mg | ORAL_TABLET | Freq: Once | ORAL | Status: AC
  Administered 2019-05-07: 09:00:00 1000 mg via ORAL

## 2019-05-07 MED ORDER — LIDOCAINE-EPINEPHRINE 1 %-1:100000 IJ SOLN
INTRAMUSCULAR | Status: DC | PRN
  Administered 2019-05-07: 2.5 mL

## 2019-05-07 MED ORDER — MIDAZOLAM HCL 2 MG/2ML IJ SOLN
INTRAMUSCULAR | Status: AC
  Filled 2019-05-07: qty 2

## 2019-05-07 MED ORDER — CELECOXIB 400 MG PO CAPS
400.0000 mg | ORAL_CAPSULE | Freq: Once | ORAL | Status: AC
  Administered 2019-05-07: 400 mg via ORAL

## 2019-05-07 MED ORDER — FENTANYL CITRATE (PF) 100 MCG/2ML IJ SOLN
INTRAMUSCULAR | Status: DC | PRN
  Administered 2019-05-07: 100 ug via INTRAVENOUS

## 2019-05-07 MED ORDER — DEXAMETHASONE SODIUM PHOSPHATE 10 MG/ML IJ SOLN
INTRAMUSCULAR | Status: AC
  Filled 2019-05-07: qty 1

## 2019-05-07 MED ORDER — PHENYLEPHRINE 40 MCG/ML (10ML) SYRINGE FOR IV PUSH (FOR BLOOD PRESSURE SUPPORT)
PREFILLED_SYRINGE | INTRAVENOUS | Status: AC
  Filled 2019-05-07: qty 10

## 2019-05-07 MED ORDER — PROMETHAZINE HCL 25 MG/ML IJ SOLN: 6.2500 mg | INTRAMUSCULAR | Status: DC | PRN

## 2019-05-07 MED ORDER — OXYMETAZOLINE HCL 0.05 % NA SOLN
NASAL | Status: AC
  Filled 2019-05-07: qty 30

## 2019-05-07 MED ORDER — PHENYLEPHRINE HCL (PRESSORS) 10 MG/ML IV SOLN
INTRAVENOUS | Status: DC | PRN
  Administered 2019-05-07: 160 ug via INTRAVENOUS
  Administered 2019-05-07 (×2): 120 ug via INTRAVENOUS

## 2019-05-07 MED ORDER — FENTANYL CITRATE (PF) 100 MCG/2ML IJ SOLN: 50.0000 ug | INTRAMUSCULAR | Status: DC | PRN

## 2019-05-07 MED ORDER — MIDAZOLAM HCL 2 MG/2ML IJ SOLN
INTRAMUSCULAR | Status: DC | PRN
  Administered 2019-05-07: 2 mg via INTRAVENOUS

## 2019-05-07 MED ORDER — CEFAZOLIN SODIUM-DEXTROSE 2-3 GM-%(50ML) IV SOLR
INTRAVENOUS | Status: DC | PRN
  Administered 2019-05-07: 2 g via INTRAVENOUS

## 2019-05-07 MED ORDER — FENTANYL CITRATE (PF) 100 MCG/2ML IJ SOLN
INTRAMUSCULAR | Status: AC
  Filled 2019-05-07: qty 2

## 2019-05-07 MED ORDER — CEFAZOLIN SODIUM-DEXTROSE 2-4 GM/100ML-% IV SOLN
INTRAVENOUS | Status: AC
  Filled 2019-05-07: qty 100

## 2019-05-07 MED ORDER — ACETAMINOPHEN 500 MG PO TABS
ORAL_TABLET | ORAL | Status: AC
  Filled 2019-05-07: qty 2

## 2019-05-07 MED ORDER — PROPOFOL 10 MG/ML IV BOLUS
INTRAVENOUS | Status: AC
  Filled 2019-05-07: qty 20

## 2019-05-07 MED ORDER — LIDOCAINE 2% (20 MG/ML) 5 ML SYRINGE
INTRAMUSCULAR | Status: AC
  Filled 2019-05-07: qty 5

## 2019-05-07 MED ORDER — DEXAMETHASONE SODIUM PHOSPHATE 10 MG/ML IJ SOLN
INTRAMUSCULAR | Status: DC | PRN
  Administered 2019-05-07: 10 mg via INTRAVENOUS

## 2019-05-07 MED ORDER — FENTANYL CITRATE (PF) 100 MCG/2ML IJ SOLN: 25.0000 ug | INTRAMUSCULAR | Status: DC | PRN

## 2019-05-07 MED ORDER — ARTIFICIAL TEARS OPHTHALMIC OINT
TOPICAL_OINTMENT | OPHTHALMIC | Status: AC
  Filled 2019-05-07: qty 3.5

## 2019-05-07 SURGICAL SUPPLY — 64 items
ADH SKN CLS APL DERMABOND .7 (GAUZE/BANDAGES/DRESSINGS)
APL SKNCLS STERI-STRIP NONHPOA (GAUZE/BANDAGES/DRESSINGS)
ATTRACTOMAT 16X20 MAGNETIC DRP (DRAPES) IMPLANT
BAND INSRT 18 STRL LF DISP RB (MISCELLANEOUS)
BAND RUBBER #18 3X1/16 STRL (MISCELLANEOUS) IMPLANT
BENZOIN TINCTURE PRP APPL 2/3 (GAUZE/BANDAGES/DRESSINGS) IMPLANT
BLADE SURG 15 STRL LF DISP TIS (BLADE) ×1 IMPLANT
BLADE SURG 15 STRL SS (BLADE) ×3
CANISTER SUCT 1200ML W/VALVE (MISCELLANEOUS) ×3 IMPLANT
CLEANER CAUTERY TIP 5X5 PAD (MISCELLANEOUS) ×1 IMPLANT
CLOSURE WOUND 1/2 X4 (GAUZE/BANDAGES/DRESSINGS)
CORD BIPOLAR FORCEPS 12FT (ELECTRODE) ×2 IMPLANT
COVER BACK TABLE REUSABLE LG (DRAPES) ×3 IMPLANT
COVER MAYO STAND REUSABLE (DRAPES) ×3 IMPLANT
COVER WAND RF STERILE (DRAPES) IMPLANT
DERMABOND ADVANCED (GAUZE/BANDAGES/DRESSINGS)
DERMABOND ADVANCED .7 DNX12 (GAUZE/BANDAGES/DRESSINGS) IMPLANT
DRAIN JACKSON RD 7FR 3/32 (WOUND CARE) IMPLANT
DRAIN PENROSE 1/4X12 LTX STRL (WOUND CARE) IMPLANT
DRAPE HALF SHEET 70X43 (DRAPES) IMPLANT
DRAPE U-SHAPE 76X120 STRL (DRAPES) ×3 IMPLANT
ELECT COATED BLADE 2.86 ST (ELECTRODE) ×3 IMPLANT
ELECT REM PT RETURN 9FT ADLT (ELECTROSURGICAL) ×3
ELECTRODE REM PT RTRN 9FT ADLT (ELECTROSURGICAL) ×1 IMPLANT
EVACUATOR SILICONE 100CC (DRAIN) IMPLANT
GAUZE 4X4 16PLY RFD (DISPOSABLE) IMPLANT
GAUZE SPONGE 4X4 12PLY STRL (GAUZE/BANDAGES/DRESSINGS) IMPLANT
GAUZE SPONGE 4X4 12PLY STRL LF (GAUZE/BANDAGES/DRESSINGS) IMPLANT
GLOVE BIO SURGEON STRL SZ7.5 (GLOVE) ×3 IMPLANT
GLOVE BIOGEL PI IND STRL 7.0 (GLOVE) IMPLANT
GLOVE BIOGEL PI INDICATOR 7.0 (GLOVE) ×6
GLOVE ECLIPSE 6.5 STRL STRAW (GLOVE) ×2 IMPLANT
GLOVE SURG SS PI 7.0 STRL IVOR (GLOVE) ×2 IMPLANT
GOWN STRL REUS W/ TWL LRG LVL3 (GOWN DISPOSABLE) ×1 IMPLANT
GOWN STRL REUS W/TWL LRG LVL3 (GOWN DISPOSABLE) ×9
NDL HYPO 25X1 1.5 SAFETY (NEEDLE) ×1 IMPLANT
NEEDLE HYPO 25X1 1.5 SAFETY (NEEDLE) ×3 IMPLANT
NS IRRIG 1000ML POUR BTL (IV SOLUTION) ×2 IMPLANT
PACK BASIN DAY SURGERY FS (CUSTOM PROCEDURE TRAY) ×3 IMPLANT
PAD CLEANER CAUTERY TIP 5X5 (MISCELLANEOUS) ×2
PENCIL BUTTON HOLSTER BLD 10FT (ELECTRODE) ×3 IMPLANT
SLEEVE SCD COMPRESS KNEE MED (MISCELLANEOUS) ×2 IMPLANT
SPONGE GAUZE 2X2 8PLY STER LF (GAUZE/BANDAGES/DRESSINGS)
SPONGE GAUZE 2X2 8PLY STRL LF (GAUZE/BANDAGES/DRESSINGS) IMPLANT
STAPLER VISISTAT 35W (STAPLE) IMPLANT
STRIP CLOSURE SKIN 1/2X4 (GAUZE/BANDAGES/DRESSINGS) IMPLANT
SUCTION FRAZIER HANDLE 10FR (MISCELLANEOUS) ×2
SUCTION TUBE FRAZIER 10FR DISP (MISCELLANEOUS) IMPLANT
SUT CHROMIC 3 0 PS 2 (SUTURE) IMPLANT
SUT CHROMIC 4 0 P 3 18 (SUTURE) IMPLANT
SUT ETHILON 4 0 PS 2 18 (SUTURE) IMPLANT
SUT ETHILON 5 0 P 3 18 (SUTURE)
SUT NYLON ETHILON 5-0 P-3 1X18 (SUTURE) IMPLANT
SUT PLAIN 5 0 P 3 18 (SUTURE) IMPLANT
SUT SILK 4 0 TIES 17X18 (SUTURE) ×2 IMPLANT
SUT VIC AB 4-0 P-3 18XBRD (SUTURE) IMPLANT
SUT VIC AB 4-0 P3 18 (SUTURE) ×3
SUT VICRYL 3-0 RB1 18 ABS (SUTURE) ×2 IMPLANT
SUT VICRYL 4-0 PS2 18IN ABS (SUTURE) ×2 IMPLANT
SYR BULB 3OZ (MISCELLANEOUS) ×2 IMPLANT
SYR CONTROL 10ML LL (SYRINGE) ×3 IMPLANT
TRAY DSU PREP LF (CUSTOM PROCEDURE TRAY) ×3 IMPLANT
TUBE CONNECTING 20'X1/4 (TUBING) ×1
TUBE CONNECTING 20X1/4 (TUBING) ×2 IMPLANT

## 2019-05-07 NOTE — Anesthesia Postprocedure Evaluation (Signed)
Anesthesia Post Note  Patient: Jesse [name]  Procedure(s) Performed: LYMPH NODE BIOPSY (Left Neck)     Patient location during evaluation: PACU Anesthesia Type: General Level of consciousness: sedated Pain management: pain level controlled Vital Signs Assessment: post-procedure vital signs reviewed and stable Respiratory status: spontaneous breathing and respiratory function stable Cardiovascular status: stable Postop Assessment: no apparent nausea or vomiting Anesthetic complications: no    Last Vitals:  Vitals:   05/07/19 1100 05/07/19 1111  BP: 103/70 112/73  Pulse: 70 67  Resp: 17 16  Temp:  36.7 C  SpO2: 98% 98%    Last Pain:  Vitals:   05/07/19 1111  TempSrc: Oral  PainSc: 0-No pain                 Rubbie Goostree DANIEL

## 2019-05-07 NOTE — Transfer of Care (Signed)
Immediate Anesthesia Transfer of Care Note  Patient: Jesse [name]  Procedure(s) Performed: LYMPH NODE BIOPSY (Left Neck)  Patient Location: PACU  Anesthesia Type:General  Level of Consciousness: awake, alert  and oriented  Airway & Oxygen Therapy: Patient Spontanous Breathing and Patient connected to face mask oxygen  Post-op Assessment: Report given to RN, Post -op Vital signs reviewed and stable, Patient moving all extremities X 4 and Patient able to stick tongue midline  Post vital signs: Reviewed and stable  Last Vitals:  Vitals Value Taken Time  BP 106/71 05/07/19 1028  Temp    Pulse 68 05/07/19 1029  Resp 17 05/07/19 1029  SpO2 100 % 05/07/19 1029  Vitals shown include unvalidated device data.  Last Pain:  Vitals:   05/07/19 0824  TempSrc: Oral  PainSc: 0-No pain         Complications: No apparent anesthesia complications

## 2019-05-07 NOTE — Anesthesia Preprocedure Evaluation (Addendum)
Anesthesia Evaluation  Patient identified by MRN, date of birth, ID band Patient awake    Reviewed: Allergy & Precautions, NPO status , Patient's Chart, lab work & pertinent test results  History of Anesthesia Complications Negative for: history of anesthetic complications  Airway Mallampati: II  TM Distance: >3 FB Neck ROM: Full    Dental  (+) Poor Dentition, Dental Advisory Given   Pulmonary former smoker,    Pulmonary exam normal        Cardiovascular negative cardio ROS Normal cardiovascular exam     Neuro/Psych negative neurological ROS  negative psych ROS   GI/Hepatic negative GI ROS, Neg liver ROS,   Endo/Other  negative endocrine ROS  Renal/GU negative Renal ROS     Musculoskeletal negative musculoskeletal ROS (+)   Abdominal   Peds  Hematology negative hematology ROS (+)   Anesthesia Other Findings Day of surgery medications reviewed with the patient.  Reproductive/Obstetrics                            Anesthesia Physical Anesthesia Plan  ASA: II  Anesthesia Plan: General   Post-op Pain Management:    Induction: Intravenous  PONV Risk Score and Plan: 3 and Ondansetron, Dexamethasone and Midazolam  Airway Management Planned: LMA  Additional Equipment:   Intra-op Plan:   Post-operative Plan: Extubation in OR  Informed Consent: I have reviewed the patients History and Physical, chart, labs and discussed the procedure including the risks, benefits and alternatives for the proposed anesthesia with the patient or authorized representative who has indicated his/her understanding and acceptance.     Dental advisory given  Plan Discussed with: CRNA, Anesthesiologist and Surgeon  Anesthesia Plan Comments:       Anesthesia Quick Evaluation

## 2019-05-07 NOTE — H&P (Signed)
Jesse [name] is an 52 y.o. male.   Chief Complaint: Lymphadenopathy HPI: 52 year old male with swelling of the left side of his neck for the past few months.  A CT scan demonstrates nodal enlargement in all zones in the left side of the neck.  He presents for excisional biopsy.  Past Medical History:  Diagnosis Date  . ETOH abuse   . Lymphadenopathy    left  . Marijuana abuse     Past Surgical History:  Procedure Laterality Date  . HERNIA REPAIR     as child    Family History  Problem Relation Age of Onset  . Cancer Mother   . Cancer Father   . Cancer Sister    Social History:  reports that he quit smoking about 20 years ago. He has a 20.00 pack-year smoking history. He has never used smokeless tobacco. He reports current alcohol use. He reports current drug use. Drug: Marijuana.  Allergies: No Known Allergies  No medications prior to admission.    No results found for this or any previous visit (from the past 48 hour(s)). No results found.  Review of Systems  All other systems reviewed and are negative.   Blood pressure 139/82, pulse 78, temperature 99.5 F (37.5 C), temperature source Oral, resp. rate 16, height 5\' 6"  (1.676 m), weight 60.6 kg, SpO2 100 %. Physical Exam  Constitutional: He is oriented to person, place, and time. He appears well-developed and well-nourished. No distress.  HENT:  Head: Normocephalic and atraumatic.  Right Ear: External ear normal.  Left Ear: External ear normal.  Nose: Nose normal.  Mouth/Throat: Oropharynx is clear and moist.  Eyes: Pupils are equal, round, and reactive to light. Conjunctivae and EOM are normal.  Neck:  Bulky left-sided lymphadenopathy involving zone 2 and 5  Cardiovascular: Normal rate.  Respiratory: Effort normal.  Neurological: He is alert and oriented to person, place, and time. No cranial nerve deficit.  Skin: Skin is warm and dry.  Psychiatric: He has a normal mood and affect. His behavior is  normal. Judgment and thought content normal.     Assessment/Plan Cervical lymphadenopathy, possible lymphoma  To OR for excisional biopsy of left cervical lymph node.  Melida Quitter, MD 05/07/2019, 9:10 AM

## 2019-05-07 NOTE — Discharge Instructions (Signed)
Next dose of tylenol may be given at 2:30pm Next dose of ibuprofen may be given at 2:30pm   Post Anesthesia Home Care Instructions  Activity: Get plenty of rest for the remainder of the day. A responsible individual must stay with you for 24 hours following the procedure.  For the next 24 hours, DO NOT: -Drive a car -Paediatric nurse -Drink alcoholic beverages -Take any medication unless instructed by your physician -Make any legal decisions or sign important papers.  Meals: Start with liquid foods such as gelatin or soup. Progress to regular foods as tolerated. Avoid greasy, spicy, heavy foods. If nausea and/or vomiting occur, drink only clear liquids until the nausea and/or vomiting subsides. Call your physician if vomiting continues.  Special Instructions/Symptoms: Your throat may feel dry or sore from the anesthesia or the breathing tube placed in your throat during surgery. If this causes discomfort, gargle with warm salt water. The discomfort should disappear within 24 hours.  If you had a scopolamine patch placed behind your ear for the management of post- operative nausea and/or vomiting:  1. The medication in the patch is effective for 72 hours, after which it should be removed.  Wrap patch in a tissue and discard in the trash. Wash hands thoroughly with soap and water. 2. You may remove the patch earlier than 72 hours if you experience unpleasant side effects which may include dry mouth, dizziness or visual disturbances. 3. Avoid touching the patch. Wash your hands with soap and water after contact with the patch.

## 2019-05-07 NOTE — Brief Op Note (Signed)
05/07/2019  10:20 AM  PATIENT:  Jesse [name]  53 y.o. male  PRE-OPERATIVE DIAGNOSIS:  Cervical lymphadenopathy  POST-OPERATIVE DIAGNOSIS:  Cervical lymphadenopathy  PROCEDURE:  Procedure(s): LYMPH NODE BIOPSY (Left)  SURGEON:  Surgeon(s) and Role:    Melida Quitter, MD - Primary  PHYSICIAN ASSISTANT:   ASSISTANTS: none   ANESTHESIA:   general  EBL:  5 mL   BLOOD ADMINISTERED:none  DRAINS: none   LOCAL MEDICATIONS USED:  LIDOCAINE   SPECIMEN:  Source of Specimen:  Left cervical nodes, zone 2  DISPOSITION OF SPECIMEN:  PATHOLOGY  COUNTS:  YES  TOURNIQUET:  * No tourniquets in log *  DICTATION: .Other Dictation: Dictation Number [id number]  PLAN OF CARE: Discharge to home after PACU  PATIENT DISPOSITION:  PACU - hemodynamically stable.   Delay start of Pharmacological VTE agent (>24hrs) due to surgical blood loss or risk of bleeding: no

## 2019-05-07 NOTE — Anesthesia Procedure Notes (Signed)
Procedure Name: Intubation Date/Time: 05/07/2019 9:38 AM Performed by: Duane Boston, MD Pre-anesthesia Checklist: Patient identified, Emergency Drugs available, Suction available and Patient being monitored Patient Re-evaluated:Patient Re-evaluated prior to induction Oxygen Delivery Method: Circle system utilized Preoxygenation: Pre-oxygenation with 100% oxygen Induction Type: IV induction Laryngoscope Size: Mac and 3 Grade View: Grade I Tube type: Oral Tube size: 7.0 mm Number of attempts: 1 Airway Equipment and Method: Stylet Placement Confirmation: ETT inserted through vocal cords under direct vision,  positive ETCO2 and breath sounds checked- equal and bilateral Secured at: 22 cm Tube secured with: Tape

## 2019-05-07 NOTE — Op Note (Signed)
NAME: Jesse [name], RESENDES MEDICAL RECORD [id number] ACCOUNT [id number] DATE OF BIRTH:[birth year] FACILITY: [facility] LOCATION: [facility] PHYSICIAN:Fae Blossom Guido Sander, MD  OPERATIVE REPORT  DATE OF PROCEDURE:  05/07/2019  PREOPERATIVE DIAGNOSIS:  Cervical lymphadenopathy, possible lymphoma.  POSTOPERATIVE DIAGNOSIS:  Cervical lymphadenopathy, possible lymphoma.  PROCEDURE:  Excisional biopsy of left cervical lymph nodes.  SURGEON:  Melida Quitter, MD  ANESTHESIA:  General endotracheal anesthesia.  COMPLICATIONS:  None.  INDICATIONS:  The patient is a 53 year old male who noticed swelling in the left neck a few months ago and has had a CT scan demonstrating numerous enlarged lymph nodes through all zones of the left side of the neck.  He presents to the operating room  for excisional biopsy.  FINDINGS:  A few bulky nodes were removed from zone 2 and other nodes were still present in the wound.  The specimen was sent for lymphoma protocol.  DESCRIPTION OF PROCEDURE:  The patient was identified in the holding room, informed consent having been obtained with discussion of risks, benefits and alternatives, the patient was brought to the operative suite and put the operative table in supine  position.  Anesthesia was induced and the patient was intubated by the anesthesia team without difficulty.  The patient was given intravenous antibiotics during the case.  The eyes were taped closed and the left neck incision was marked with a marking  pen and injection performed with 1% lidocaine with 1:100,000 epinephrine.  The left neck was prepped and draped in sterile fashion.  An incision was made with a 15 blade scalpel and extended through the subcutaneous tissue and platysma layer using Bovie  electrocautery.  Small subplatysmal flaps were elevated superiorly and inferiorly.  The self-retaining retractor was added.  The sternocleidomastoid muscle was then skeletonized until the lymph nodes were  encountered.  A cluster of bulky lymph nodes were  then dissected free from surrounding tissues using Bovie electrocautery, staying along the edge of the lymph nodes and once removed, passing to nursing for pathology for lymphoma protocol.  In the depths of the wound, bleeding was controlled with  bipolar electrocautery and suture ligature.  The wound was copiously irrigated with saline.  The self-retaining retractor was removed and the platysma layer was closed with 3-0 Vicryl suture in a simple interrupted fashion.  The subcutaneous layer was  closed with 4-0 Vicryl suture in a simple interrupted fashion.  Dermabond was applied to the skin.  The patient was then cleaned off and drapes were removed.  He was returned to Anesthesia for wakeup was extubated in the recovery room in stable  condition.  TN/NUANCE  D:05/07/2019 T:05/07/2019 JOB:[id number]

## 2019-05-08 ENCOUNTER — Telehealth: Payer: Self-pay | Admitting: Physician Assistant

## 2019-05-08 ENCOUNTER — Encounter (HOSPITAL_BASED_OUTPATIENT_CLINIC_OR_DEPARTMENT_OTHER): Payer: Self-pay | Admitting: Otolaryngology

## 2019-05-08 NOTE — Telephone Encounter (Signed)
R/s appt per 9/21 sch message - pt aware of new appt date and time

## 2019-05-09 LAB — SURGICAL PATHOLOGY

## 2019-05-10 ENCOUNTER — Inpatient Hospital Stay: Payer: Medicaid Other

## 2019-05-10 ENCOUNTER — Telehealth: Payer: Self-pay | Admitting: Internal Medicine

## 2019-05-10 ENCOUNTER — Encounter: Payer: Self-pay | Admitting: Physician Assistant

## 2019-05-10 ENCOUNTER — Inpatient Hospital Stay (HOSPITAL_BASED_OUTPATIENT_CLINIC_OR_DEPARTMENT_OTHER): Payer: Medicaid Other | Admitting: Physician Assistant

## 2019-05-10 ENCOUNTER — Inpatient Hospital Stay: Payer: Medicaid Other | Admitting: Physician Assistant

## 2019-05-10 ENCOUNTER — Other Ambulatory Visit: Payer: Self-pay | Admitting: Physician Assistant

## 2019-05-10 ENCOUNTER — Other Ambulatory Visit: Payer: Self-pay

## 2019-05-10 VITALS — BP 119/86 | HR 65 | Temp 98.9°F | Resp 18 | Ht 66.0 in | Wt 135.3 lb

## 2019-05-10 DIAGNOSIS — R590 Localized enlarged lymph nodes: Secondary | ICD-10-CM

## 2019-05-10 DIAGNOSIS — C8110 Nodular sclerosis classical Hodgkin lymphoma, unspecified site: Secondary | ICD-10-CM

## 2019-05-10 DIAGNOSIS — C8190 Hodgkin lymphoma, unspecified, unspecified site: Secondary | ICD-10-CM | POA: Insufficient documentation

## 2019-05-10 DIAGNOSIS — Z7189 Other specified counseling: Secondary | ICD-10-CM | POA: Insufficient documentation

## 2019-05-10 DIAGNOSIS — C8191 Hodgkin lymphoma, unspecified, lymph nodes of head, face, and neck: Secondary | ICD-10-CM | POA: Diagnosis not present

## 2019-05-10 DIAGNOSIS — C8111 Nodular sclerosis classical Hodgkin lymphoma, lymph nodes of head, face, and neck: Secondary | ICD-10-CM

## 2019-05-10 LAB — CBC WITH DIFFERENTIAL (CANCER CENTER ONLY)
Abs Immature Granulocytes: 0.02 10*3/uL (ref 0.00–0.07)
Basophils Absolute: 0 10*3/uL (ref 0.0–0.1)
Basophils Relative: 1 %
Eosinophils Absolute: 0 10*3/uL (ref 0.0–0.5)
Eosinophils Relative: 1 %
HCT: 36.7 % — ABNORMAL LOW (ref 39.0–52.0)
Hemoglobin: 12.1 g/dL — ABNORMAL LOW (ref 13.0–17.0)
Immature Granulocytes: 1 %
Lymphocytes Relative: 24 %
Lymphs Abs: 1.1 10*3/uL (ref 0.7–4.0)
MCH: 30 pg (ref 26.0–34.0)
MCHC: 33 g/dL (ref 30.0–36.0)
MCV: 90.8 fL (ref 80.0–100.0)
Monocytes Absolute: 0.8 10*3/uL (ref 0.1–1.0)
Monocytes Relative: 19 %
Neutro Abs: 2.4 10*3/uL (ref 1.7–7.7)
Neutrophils Relative %: 54 %
Platelet Count: 316 10*3/uL (ref 150–400)
RBC: 4.04 MIL/uL — ABNORMAL LOW (ref 4.22–5.81)
RDW: 13 % (ref 11.5–15.5)
WBC Count: 4.3 10*3/uL (ref 4.0–10.5)
nRBC: 0 % (ref 0.0–0.2)

## 2019-05-10 LAB — CMP (CANCER CENTER ONLY)
ALT: 15 U/L (ref 0–44)
AST: 14 U/L — ABNORMAL LOW (ref 15–41)
Albumin: 3.2 g/dL — ABNORMAL LOW (ref 3.5–5.0)
Alkaline Phosphatase: 139 U/L — ABNORMAL HIGH (ref 38–126)
Anion gap: 8 (ref 5–15)
BUN: 13 mg/dL (ref 6–20)
CO2: 28 mmol/L (ref 22–32)
Calcium: 8.6 mg/dL — ABNORMAL LOW (ref 8.9–10.3)
Chloride: 101 mmol/L (ref 98–111)
Creatinine: 0.77 mg/dL (ref 0.61–1.24)
GFR, Est AFR Am: >60 mL/min (ref >60)
GFR, Estimated: >60 mL/min (ref >60)
Glucose, Bld: 112 mg/dL — ABNORMAL HIGH (ref 70–99)
Potassium: 3.7 mmol/L (ref 3.5–5.1)
Sodium: 137 mmol/L (ref 135–145)
Total Bilirubin: 0.4 mg/dL (ref 0.3–1.2)
Total Protein: 7.4 g/dL (ref 6.5–8.1)

## 2019-05-10 LAB — URIC ACID: Uric Acid, Serum: 4.6 mg/dL (ref 3.7–8.6)

## 2019-05-10 LAB — SEDIMENTATION RATE: Sed Rate: 42 mm/hr — ABNORMAL HIGH (ref 0–16)

## 2019-05-10 LAB — LACTATE DEHYDROGENASE: LDH: 175 U/L (ref 98–192)

## 2019-05-10 NOTE — Progress Notes (Signed)
[facility] OFFICE PROGRESS NOTE  Patient, No Pcp Per No address on file  DIAGNOSIS: Hodgkin's Lymphoma. He presented with left supraclavicular lymphadenopathy in September 2020. Pending further staging work up.   PRIOR THERAPY: None  CURRENT THERAPY: None  INTERVAL HISTORY: Jesse [name] 53 y.o. male returns to the clinic for a follow-up visit.  The patient is feeling fairly well today without any concerning complaints except for intermittent fevers and night sweats. The patient was referred to the clinic for evaluation after he presented to the emergency room for these concerns as well as left neck swelling for 2 months. The patient had CT scan of the neck on 04/18/2019 and it showed bulky lymphadenopathy throughout the left neck and left axilla compatible with neoplasm.  The patient recently had an excisional biopsy performed of the left supraclavicular lymph node under the care of Dr. Redmond Baseman from ENT. The patient tolerated the procedure well without any side effects.  The patient denies any weight loss, chills, or other adenopathy besides the neck and axilla.  He denies any chest pain, shortness of breath, cough, or hemoptysis.  He denies any abdominal pain, diarrhea, constipation, nausea, vomiting, or abdominal fullness.  He denies any itching. He denies any recent infections. He denies any bleeding or bruising. The patient is here today to review his biopsy results and recommendations for treatment options.  MEDICAL HISTORY: Past Medical History:  Diagnosis Date  . ETOH abuse   . Lymphadenopathy    left  . Marijuana abuse     ALLERGIES:  has No Known Allergies.  MEDICATIONS:  No current outpatient medications on file.   No current facility-administered medications for this visit.     SURGICAL HISTORY:  Past Surgical History:  Procedure Laterality Date  . HERNIA REPAIR     as child  . LYMPH NODE BIOPSY Left 05/07/2019   Procedure: LYMPH NODE BIOPSY;   Surgeon: Melida Quitter, MD;  Location: [facility];  Service: ENT;  Laterality: Left;    REVIEW OF SYSTEMS:   Review of Systems  Constitutional: Positive for intermittent fevers. Negative for appetite change, chills, fatigue,  and unexpected weight change.  HENT: Negative for mouth sores, nosebleeds, sore throat and trouble swallowing.   Eyes: Negative for eye problems and icterus.  Respiratory: Negative for cough, hemoptysis, shortness of breath and wheezing.   Cardiovascular: Negative for chest pain and leg swelling.  Gastrointestinal: Negative for abdominal pain, constipation, diarrhea, nausea and vomiting.  Genitourinary: Negative for bladder incontinence, difficulty urinating, dysuria, frequency and hematuria.   Musculoskeletal: Negative for back pain, gait problem, neck pain and neck stiffness.  Skin: Negative for itching and rash.  Neurological: Negative for dizziness, extremity weakness, gait problem, headaches, light-headedness and seizures.  Hematological: Positive for left cervical and axillary lymphadenopathy. Does not bruise/bleed easily.  Psychiatric/Behavioral: Negative for confusion, depression and sleep disturbance. The patient is not nervous/anxious.     PHYSICAL EXAMINATION:  Blood pressure 119/86, pulse 65, temperature 98.9 F (37.2 C), temperature source Temporal, resp. rate 18, height _0  (1.676 m), weight 135 lb 4.8 oz (61.4 kg), SpO2 100 %.  ECOG PERFORMANCE STATUS: 1 - Symptomatic but completely ambulatory  Physical Exam  Constitutional: Oriented to person, place, and time and well-developed, well-nourished, and in no distress.  HENT:  Head: Normocephalic and atraumatic.  Mouth/Throat: Oropharynx is clear and moist. No oropharyngeal exudate.  Eyes: Conjunctivae are normal. Right eye exhibits no discharge. Left eye exhibits no discharge. No scleral icterus.  Neck: Normal range of motion. Neck supple.  Cardiovascular: Normal rate, regular rhythm,  normal heart sounds and intact distal pulses.   Pulmonary/Chest: Effort normal and breath sounds normal. No respiratory distress. No wheezes. No rales.  Abdominal: Soft. Bowel sounds are normal. Exhibits no distension and no mass. There is no tenderness.  Musculoskeletal: Normal range of motion. Exhibits no edema.  Lymphadenopathy:    Positive for multiple enlarged left supraclavicular, lower cervical, and axillary lymphadenopathy.   Neurological: Alert and oriented to person, place, and time. Exhibits normal muscle tone. Gait normal. Coordination normal.  Skin: Skin is warm and dry. No rash noted. Not diaphoretic. No erythema. No pallor.  Psychiatric: Mood, memory and judgment normal.  Vitals reviewed.  LABORATORY DATA: Lab Results  Component Value Date   WBC 4.3 05/10/2019   HGB 12.1 (L) 05/10/2019   HCT 36.7 (L) 05/10/2019   MCV 90.8 05/10/2019   PLT 316 05/10/2019      Chemistry      Component Value Date/Time   NA 137 05/10/2019 1226   K 3.7 05/10/2019 1226   CL 101 05/10/2019 1226   CO2 28 05/10/2019 1226   BUN 13 05/10/2019 1226   CREATININE 0.77 05/10/2019 1226      Component Value Date/Time   CALCIUM 8.6 (L) 05/10/2019 1226   ALKPHOS 139 (H) 05/10/2019 1226   AST 14 (L) 05/10/2019 1226   ALT 15 05/10/2019 1226   BILITOT 0.4 05/10/2019 1226       RADIOGRAPHIC STUDIES:  Ct Soft Tissue Neck W Contrast  Result Date: 04/18/2019 CLINICAL DATA:  Enlarged lymph nodes neck and axilla EXAM: CT NECK WITH CONTRAST TECHNIQUE: Multidetector CT imaging of the neck was performed using the standard protocol following the bolus administration of intravenous contrast. CONTRAST:  55m OMNIPAQUE IOHEXOL 300 MG/ML  SOLN COMPARISON:  None. FINDINGS: Pharynx and larynx: Normal. No mass or swelling. Salivary glands: Diffusely enlarged parotid glands bilaterally. No focal mass. Submandibular glands also mildly enlarged bilaterally. Thyroid: Negative Lymph nodes: Bulky lymphadenopathy left  neck. Numerous large lymph nodes. Large left lymph node at the level of the hyoid measures 30 mm. Posterior lymph node at the same level measures 27 mm. Numerous additional enlarged lymph nodes are seen down to the supraclavicular fossa and left axilla. Scattered small nodes in the right neck without definite pathologic nodes on the right. No enlarged lymph nodes in the right axilla. Vascular: Normal vascular enhancement. Left jugular vein compressed by adenopathy. Limited intracranial: Negative Visualized orbits: Negative Mastoids and visualized paranasal sinuses: Mild mucosal edema left maxillary sinus otherwise clear. Skeleton: Poor dentition with numerous caries Cervical spondylosis, moderate in degree. Upper chest: Chest CT reported separately Other: None IMPRESSION: Bulky lymphadenopathy throughout the left neck and left axilla compatible with neoplasm. This may represent lymphoma or metastatic disease. Small lymph nodes are present in the right neck. No pharyngeal mass identified. Poor dentition Electronically Signed   By: CFranchot GalloM.D.   On: 04/18/2019 20:11   Ct Chest W Contrast  Result Date: 04/18/2019 CLINICAL DATA:  Several months of enlarged lymph nodes in the left side of the neck and supraclavicular area. Abdominal discomfort. Possible weight loss and recent fever. EXAM: CT CHEST, ABDOMEN, AND PELVIS WITH CONTRAST TECHNIQUE: Multidetector CT imaging of the chest, abdomen and pelvis was performed following the standard protocol during bolus administration of intravenous contrast. CONTRAST:  75 mL OMNIPAQUE IOHEXOL 300 MG/ML  SOLN COMPARISON:  None. FINDINGS: CT CHEST FINDINGS Cardiovascular: No significant vascular findings.  Normal heart size. No pericardial effusion. Calcific aortic and coronary atherosclerosis noted. Mediastinum/Nodes: 1.5 cm lymph node in the lower left neck is seen on image 3 of series 3. There is also a supraclavicular lymph node on the left measuring 1.3 cm short axis  dimension on image 7 series 3. 2.4 cm in diameter left supraclavicular node is identified on image 6 of series 3. No axillary, hilar or mediastinal lymphadenopathy is identified. Esophagus and thyroid gland appear normal. Lungs/Pleura: Lungs are clear.  No pleural effusion. Musculoskeletal: No lytic or sclerotic lesion. No fracture. A few small Schmorl's nodes in the thoracic spine are noted. CT ABDOMEN PELVIS FINDINGS Hepatobiliary: 2.7 cm in diameter hypoattenuating lesion with peripheral nodular enhancement in the right hepatic lobe on image 54 is consistent with a hemangioma. Two tiny cysts are also identified on image 49. The liver is otherwise unremarkable. Gallbladder and biliary tree appear normal. Pancreas: Unremarkable. No pancreatic ductal dilatation or surrounding inflammatory changes. Spleen: Normal in size without focal abnormality. Adrenals/Urinary Tract: Adrenal glands are unremarkable. Kidneys are normal, without renal calculi, focal lesion, or hydronephrosis. Bladder is unremarkable. Stomach/Bowel: Stomach is within normal limits. Appendix appears normal. No evidence of bowel wall thickening, distention, or inflammatory changes. Vascular/Lymphatic: Aortic atherosclerosis. No enlarged abdominal or pelvic lymph nodes. Reproductive: Mild prostatomegaly. Other: None. Musculoskeletal: No lytic or sclerotic lesion.  No fracture. IMPRESSION: Left supraclavicular lymphadenopathy worrisome for lymphoproliferative disease. No other lymphadenopathy is seen in the chest, abdomen or pelvis. Calcific aortic and coronary atherosclerosis. Benign hemangioma right lobe of the liver. Electronically Signed   By: Inge Rise M.D.   On: 04/18/2019 19:48   Ct Abdomen Pelvis W Contrast  Result Date: 04/18/2019 CLINICAL DATA:  Several months of enlarged lymph nodes in the left side of the neck and supraclavicular area. Abdominal discomfort. Possible weight loss and recent fever. EXAM: CT CHEST, ABDOMEN, AND PELVIS  WITH CONTRAST TECHNIQUE: Multidetector CT imaging of the chest, abdomen and pelvis was performed following the standard protocol during bolus administration of intravenous contrast. CONTRAST:  75 mL OMNIPAQUE IOHEXOL 300 MG/ML  SOLN COMPARISON:  None. FINDINGS: CT CHEST FINDINGS Cardiovascular: No significant vascular findings. Normal heart size. No pericardial effusion. Calcific aortic and coronary atherosclerosis noted. Mediastinum/Nodes: 1.5 cm lymph node in the lower left neck is seen on image 3 of series 3. There is also a supraclavicular lymph node on the left measuring 1.3 cm short axis dimension on image 7 series 3. 2.4 cm in diameter left supraclavicular node is identified on image 6 of series 3. No axillary, hilar or mediastinal lymphadenopathy is identified. Esophagus and thyroid gland appear normal. Lungs/Pleura: Lungs are clear.  No pleural effusion. Musculoskeletal: No lytic or sclerotic lesion. No fracture. A few small Schmorl's nodes in the thoracic spine are noted. CT ABDOMEN PELVIS FINDINGS Hepatobiliary: 2.7 cm in diameter hypoattenuating lesion with peripheral nodular enhancement in the right hepatic lobe on image 54 is consistent with a hemangioma. Two tiny cysts are also identified on image 49. The liver is otherwise unremarkable. Gallbladder and biliary tree appear normal. Pancreas: Unremarkable. No pancreatic ductal dilatation or surrounding inflammatory changes. Spleen: Normal in size without focal abnormality. Adrenals/Urinary Tract: Adrenal glands are unremarkable. Kidneys are normal, without renal calculi, focal lesion, or hydronephrosis. Bladder is unremarkable. Stomach/Bowel: Stomach is within normal limits. Appendix appears normal. No evidence of bowel wall thickening, distention, or inflammatory changes. Vascular/Lymphatic: Aortic atherosclerosis. No enlarged abdominal or pelvic lymph nodes. Reproductive: Mild prostatomegaly. Other: None. Musculoskeletal: No lytic or  sclerotic lesion.   No fracture. IMPRESSION: Left supraclavicular lymphadenopathy worrisome for lymphoproliferative disease. No other lymphadenopathy is seen in the chest, abdomen or pelvis. Calcific aortic and coronary atherosclerosis. Benign hemangioma right lobe of the liver. Electronically Signed   By: Inge Rise M.D.   On: 04/18/2019 19:48     ASSESSMENT/PLAN:  This is a very pleasant 53 year old African-American male who presented with supraclavicular, cervical, and axillary lymphadenopathy which his biopsy was consistent with Hodgkin's lymphoma, nodular sclerosis subtype.  The patient was diagnosed in September 2020.  The patient is pending further staging work-up.  Dr. Julien Nordmann had a lengthy discussion with the patient today about his current condition and treatment options.  Dr. Julien Nordmann recommends that the patient undergo further labs and imaging studies to confirm the stage of his disease and possible treatment options.  I have ordered several studies including a CBC, CMP, LDH, uric acid, acute hepatitis panel, HIV antibody, hep B virus serology, ESR, and beta-2 microglobulin.  I ordered a PET scan to complete the staging work-up of his disease.  I will arrange for the patient to have a echocardiogram as well as PFTs performed to assess his baseline function prior to proceeding with chemotherapy  We will arrange for the patient to have a bone marrow biopsy and aspirate performed to rule out involvement of the bone marrow.  We will see the patient back for follow-up visit in about 2 weeks for reevaluation and a more detailed discussion of his treatment options based on the staging work-up and blood work.  The patient was advised to call immediately if he has any concerning symptoms in the interval. The patient voices understanding of current disease status and treatment options and is in agreement with the current care plan. All questions were answered. The patient knows to call the clinic with any  problems, questions or concerns. We can certainly see the patient much sooner if necessary    Orders Placed This Encounter  Procedures  . NM PET Image Initial (PI) Skull Base To Thigh    Standing Status:   Future    Standing Expiration Date:   05/09/2020    Order Specific Question:   ** REASON FOR EXAM (FREE TEXT)    Answer:   Staging Hodgkins Lymphoma    Order Specific Question:   If indicated for the ordered procedure, I authorize the administration of a radiopharmaceutical per Radiology protocol    Answer:   Yes    Order Specific Question:   Preferred imaging location?    Answer:   [name]    Order Specific Question:   Radiology Contrast Protocol - do NOT remove file path    Answer:   [email or web address]  . ECHOCARDIOGRAM COMPLETE    Standing Status:   Future    Standing Expiration Date:   08/08/2020    Order Specific Question:   Where should this test be performed    Answer:   [facility]    Order Specific Question:   Perflutren DEFINITY (image enhancing agent) should be administered unless hypersensitivity or allergy exist    Answer:   Administer Perflutren    Order Specific Question:   Reason for exam-Echo    Answer:   Chemotherapy evaluation  v87.41 / v58.11  . Pulmonary Function Test    Standing Status:   Future    Standing Expiration Date:   05/09/2020    Order Specific Question:   Where should this test be performed?    Answer:  [facility]    Order Specific Question:   Full PFT: includes the following: basic spirometry, spirometry pre & post bronchodilator, diffusion capacity (DLCO), lung volumes    Answer:   Full PFT    Order Specific Question:   MIP/MEP    Answer:   No    Order Specific Question:   6 minute walk    Answer:   No    Order Specific Question:   ABG    Answer:   No    Order Specific Question:   Diffusion capacity (DLCO)    Answer:   No    Order Specific Question:   Lung volumes    Answer:   No    Order Specific Question:    Methacholine challenge    Answer:   No     Ladoris Lythgoe L Su Duma, PA-C 05/10/19  ADDENDUM: Hematology/Oncology Attending: I had a face-to-face encounter with the patient.  I recommended his care plan.  This is a very pleasant 53 years old African-American male who presented with left cervical and supraclavicular lymphadenopathy in addition to suspicious axillary lymphadenopathy.  The patient was seen by Dr. Melida Quitter from ENT and excisional biopsy of the left cervical lymph node was consistent with classical Hodgkin's lymphoma, nodular sclerosing subtype. The patient is here today for evaluation and recommendation regarding treatment of his condition. I had a lengthy discussion with the patient today about his current condition and treatment options.  I will complete the staging work-up by ordering a PET scan as well as bone marrow biopsy and aspirate to rule out any bone marrow involvement. We will also arrange for the patient to have 2D echo to evaluate his cardiac function as well as pulmonary function test for his pulmonary function and lung capacity before the treatment. We ordered several studies today including repeat CBC, comprehensive metabolic panel, LDH, uric acid, hepatitis panel as well as HIV and Epstein-Barr virus serology, beta-2 microglobulin, and ESR. We will see the patient back for follow-up visit in 2 weeks for evaluation and discussion of his systemic chemotherapy option after completion of his staging work-up and blood work. The patient agreed to the current plan. He was advised to call immediately if he has any concerning symptoms in the interval.  Disclaimer: This note was dictated with voice recognition software. Similar sounding words can inadvertently be transcribed and may be missed upon review. Eilleen Kempf, MD 05/10/19

## 2019-05-10 NOTE — Telephone Encounter (Signed)
Scheduled per 09/24 los, patient received after visit summary and calender. °

## 2019-05-10 NOTE — Patient Instructions (Addendum)
-  The biopsy is consistent with a type of lymphoma called Hodgkin's Lymphoma.  -We did a bunch of lab work on you today which we will discuss next time -We still need a few studies performed. We need to send you to get your lung tested to check their function before you start chemotherapy.  -We also need a PET scan. A PET scan lets [place] see what other areas may be involved with the cancer -We will arrange for a bone marrow biopsy just to make sure that the bone marrow is not incolved.  -We need to check your heart to see where it is functioning before your start chemo so we hae something to compare it to when we check again after chemotherapy -We will see you in two weeks and discuss in more details the treatment. We are anticipating giving you a treatment called ABVD (Doxorubicin, Bleomycin, and Vinblastine, and Dacarbazine).  -

## 2019-05-11 ENCOUNTER — Telehealth: Payer: Self-pay | Admitting: *Deleted

## 2019-05-11 LAB — HEPATITIS PANEL, ACUTE
HCV Ab: <0.1 s/co ratio (ref 0.0–0.9)
Hep A IgM: NEGATIVE
Hep B C IgM: NEGATIVE
Hepatitis B Surface Ag: NEGATIVE

## 2019-05-11 LAB — BETA 2 MICROGLOBULIN, SERUM: Beta-2 Microglobulin: 1.8 mg/L (ref 0.6–2.4)

## 2019-05-11 LAB — HIV ANTIBODY (ROUTINE TESTING W REFLEX): HIV Screen 4th Generation wRfx: NONREACTIVE

## 2019-05-11 NOTE — Telephone Encounter (Signed)
Received call from pt's significant other, Thayer Headings requesting a call back. Attempted call back. No answer but was able to leave vm message for her to return call @ [phone]

## 2019-05-14 ENCOUNTER — Encounter: Payer: Self-pay | Admitting: *Deleted

## 2019-05-14 ENCOUNTER — Telehealth: Payer: Self-pay

## 2019-05-14 ENCOUNTER — Other Ambulatory Visit: Payer: Self-pay | Admitting: *Deleted

## 2019-05-14 DIAGNOSIS — C8111 Nodular sclerosis classical Hodgkin lymphoma, lymph nodes of head, face, and neck: Secondary | ICD-10-CM

## 2019-05-14 DIAGNOSIS — R590 Localized enlarged lymph nodes: Secondary | ICD-10-CM

## 2019-05-14 LAB — EPSTEIN BARR VRS(EBV DNA BY PCR)
EBV DNA QN by PCR: 2455 copies/mL
log10 EBV DNA Qn PCR: 3.39 log10 copy/mL

## 2019-05-14 NOTE — Progress Notes (Signed)
Oncology Nurse Navigator Documentation  Oncology Nurse Navigator Flowsheets 05/14/2019  Navigator Location [facility]  Navigator Encounter Type Other/I received message from [place] PA nurse that he needs PFT's.  PFT's and covid test are ordered and nurse is updated.   Treatment Phase Abnormal Scans  Barriers/Navigation Needs Coordination of Care  Interventions Coordination of Care  Coordination of Care Other  Acuity Level 2-Minimal Needs (1-2 Barriers Identified)  Time Spent with Patient 30

## 2019-05-14 NOTE — Telephone Encounter (Signed)
Spoke with wife this am and scheduled [facility] for 05/17/19 at 7:30 am.  High priority schedule message sent an spoke with Estill Bamberg in cytometry to schedule at 8 am.

## 2019-05-15 ENCOUNTER — Encounter: Payer: Self-pay | Admitting: *Deleted

## 2019-05-15 NOTE — Progress Notes (Signed)
Oncology Nurse Navigator Documentation  Oncology Nurse Navigator Flowsheets 05/15/2019  Navigator Location [facility]  Navigator Encounter Type Telephone;Other:  Telephone Outgoing Call/I checked on patient's schedule and his PFT are on 10/6.  I called [employer] and was able his COVID test on 10/3.  I called patient to update but was unable to reach him.  I did leave a vm message with my name and phone number as well as appt for COVID test.    Treatment Phase Abnormal Scans  Barriers/Navigation Needs Coordination of Care;Education  Education Other  Interventions Coordination of Care;Education  Coordination of Care Appts;Other  Education Method Verbal  Acuity Level 2-Minimal Needs (1-2 Barriers Identified)  Time Spent with Patient 45

## 2019-05-16 ENCOUNTER — Telehealth: Payer: Self-pay | Admitting: Medical Oncology

## 2019-05-16 NOTE — Telephone Encounter (Signed)
Symptoms-Fevers ,drenching sweats 101.6 f last night and today 101 f. He feels okay . "he is scared". Taking tylenol. Per Julien Nordmann I told wife it is likely from his disease and to moniter .  0000000 conflict -PET and Dr Redmond Baseman. I told wife to r/s Dr Redmond Baseman. Pt needs PET scan.

## 2019-05-17 ENCOUNTER — Ambulatory Visit: Payer: Self-pay | Admitting: Physician Assistant

## 2019-05-17 ENCOUNTER — Other Ambulatory Visit: Payer: Self-pay

## 2019-05-17 ENCOUNTER — Telehealth: Payer: Self-pay | Admitting: *Deleted

## 2019-05-17 ENCOUNTER — Inpatient Hospital Stay: Payer: Medicaid Other

## 2019-05-17 ENCOUNTER — Inpatient Hospital Stay: Payer: Medicaid Other | Attending: Internal Medicine | Admitting: Adult Health

## 2019-05-17 VITALS — BP 138/81 | HR 78 | Temp 99.1°F | Resp 18

## 2019-05-17 DIAGNOSIS — C7951 Secondary malignant neoplasm of bone: Secondary | ICD-10-CM | POA: Diagnosis not present

## 2019-05-17 DIAGNOSIS — C8110 Nodular sclerosis classical Hodgkin lymphoma, unspecified site: Secondary | ICD-10-CM | POA: Insufficient documentation

## 2019-05-17 DIAGNOSIS — Z5111 Encounter for antineoplastic chemotherapy: Secondary | ICD-10-CM | POA: Diagnosis present

## 2019-05-17 DIAGNOSIS — C8111 Nodular sclerosis classical Hodgkin lymphoma, lymph nodes of head, face, and neck: Secondary | ICD-10-CM

## 2019-05-17 DIAGNOSIS — F1010 Alcohol abuse, uncomplicated: Secondary | ICD-10-CM | POA: Diagnosis not present

## 2019-05-17 DIAGNOSIS — R630 Anorexia: Secondary | ICD-10-CM | POA: Diagnosis not present

## 2019-05-17 DIAGNOSIS — Z5189 Encounter for other specified aftercare: Secondary | ICD-10-CM | POA: Insufficient documentation

## 2019-05-17 DIAGNOSIS — R110 Nausea: Secondary | ICD-10-CM | POA: Insufficient documentation

## 2019-05-17 DIAGNOSIS — R509 Fever, unspecified: Secondary | ICD-10-CM | POA: Diagnosis not present

## 2019-05-17 DIAGNOSIS — R61 Generalized hyperhidrosis: Secondary | ICD-10-CM | POA: Diagnosis not present

## 2019-05-17 DIAGNOSIS — Z79899 Other long term (current) drug therapy: Secondary | ICD-10-CM | POA: Insufficient documentation

## 2019-05-17 DIAGNOSIS — Z5112 Encounter for antineoplastic immunotherapy: Secondary | ICD-10-CM | POA: Insufficient documentation

## 2019-05-17 DIAGNOSIS — M542 Cervicalgia: Secondary | ICD-10-CM | POA: Insufficient documentation

## 2019-05-17 DIAGNOSIS — M545 Low back pain: Secondary | ICD-10-CM | POA: Diagnosis not present

## 2019-05-17 LAB — CBC WITH DIFFERENTIAL (CANCER CENTER ONLY)
Abs Immature Granulocytes: 0.04 10*3/uL (ref 0.00–0.07)
Basophils Absolute: 0 10*3/uL (ref 0.0–0.1)
Basophils Relative: 0 %
Eosinophils Absolute: 0.1 10*3/uL (ref 0.0–0.5)
Eosinophils Relative: 1 %
HCT: 39.4 % (ref 39.0–52.0)
Hemoglobin: 12.8 g/dL — ABNORMAL LOW (ref 13.0–17.0)
Immature Granulocytes: 1 %
Lymphocytes Relative: 18 %
Lymphs Abs: 1 10*3/uL (ref 0.7–4.0)
MCH: 29.8 pg (ref 26.0–34.0)
MCHC: 32.5 g/dL (ref 30.0–36.0)
MCV: 91.6 fL (ref 80.0–100.0)
Monocytes Absolute: 1.6 10*3/uL — ABNORMAL HIGH (ref 0.1–1.0)
Monocytes Relative: 28 %
Neutro Abs: 3 10*3/uL (ref 1.7–7.7)
Neutrophils Relative %: 52 %
Platelet Count: ADEQUATE 10*3/uL (ref 150–400)
RBC: 4.3 MIL/uL (ref 4.22–5.81)
RDW: 12.9 % (ref 11.5–15.5)
WBC Count: 5.6 10*3/uL (ref 4.0–10.5)
nRBC: 0 % (ref 0.0–0.2)

## 2019-05-17 NOTE — Patient Instructions (Signed)

## 2019-05-17 NOTE — Progress Notes (Signed)
INDICATION: lymphoma  Bone Marrow Biopsy and Aspiration Procedure Note   Informed consent was obtained and potential risks including bleeding, infection and pain were reviewed with the patient.  The patient's name, date of birth, identification, consent and allergies were verified prior to the start of procedure and time out was performed.  The left posterior iliac crest was chosen as the site of biopsy.  The skin was prepped with ChloraPrep.   8 cc of 2% lidocaine was used to provide local anaesthesia.   10 cc of bone marrow aspirate was obtained followed by 1cm biopsy.  Pressure was applied to the biopsy site and bandage was placed over the biopsy site. Patient was made to lie on the back for 30 mins prior to discharge.  The procedure was tolerated well. COMPLICATIONS: None BLOOD LOSS: none The patient was discharged home in stable condition with a 1 week follow up to review results.  Patient was provided with post bone marrow biopsy instructions and instructed to call if there was any bleeding or worsening pain.  Specimens sent for flow cytometry, cytogenetics and additional studies.  Signed Scot Dock, NP

## 2019-05-17 NOTE — Telephone Encounter (Signed)
RN was informed by Mendel Ryder Cause, NP that pt did not receive discharge instructions post bone marrow biopsy procedure today.  RN placed call to pt and educated pt on incision site aftercare and signs of infection to monitor for.  Pt verbalized understanding and discharge paperwork mailed to address on file.

## 2019-05-17 NOTE — Progress Notes (Signed)
Lab to room to draw labs. Patient left without getting vital signs prior to being d/ced home without d/c instructions. Nurse not aware that patient had left.

## 2019-05-19 ENCOUNTER — Other Ambulatory Visit (HOSPITAL_COMMUNITY)
Admission: RE | Admit: 2019-05-19 | Discharge: 2019-05-19 | Disposition: A | Discharge status: Home / Self Care | Payer: Medicaid Other | Source: Ambulatory Visit | Attending: Internal Medicine | Admitting: Internal Medicine

## 2019-05-19 DIAGNOSIS — Z20828 Contact with and (suspected) exposure to other viral communicable diseases: Secondary | ICD-10-CM | POA: Diagnosis not present

## 2019-05-19 DIAGNOSIS — Z01812 Encounter for preprocedural laboratory examination: Secondary | ICD-10-CM | POA: Diagnosis not present

## 2019-05-20 LAB — SARS CORONAVIRUS 2 (TAT 6-24 HRS): SARS Coronavirus 2: NEGATIVE

## 2019-05-21 ENCOUNTER — Encounter (HOSPITAL_COMMUNITY)
Admission: RE | Admit: 2019-05-21 | Discharge: 2019-05-21 | Disposition: A | Discharge status: Home / Self Care | Payer: Medicaid Other | Source: Ambulatory Visit | Attending: Physician Assistant | Admitting: Physician Assistant

## 2019-05-21 ENCOUNTER — Other Ambulatory Visit: Payer: Self-pay

## 2019-05-21 ENCOUNTER — Ambulatory Visit (HOSPITAL_COMMUNITY)
Admission: RE | Admit: 2019-05-21 | Discharge: 2019-05-21 | Disposition: A | Discharge status: Home / Self Care | Payer: Medicaid Other | Source: Ambulatory Visit | Attending: Physician Assistant | Admitting: Physician Assistant

## 2019-05-21 DIAGNOSIS — I083 Combined rheumatic disorders of mitral, aortic and tricuspid valves: Secondary | ICD-10-CM | POA: Diagnosis not present

## 2019-05-21 DIAGNOSIS — C8111 Nodular sclerosis classical Hodgkin lymphoma, lymph nodes of head, face, and neck: Secondary | ICD-10-CM

## 2019-05-21 LAB — GLUCOSE, CAPILLARY: Glucose-Capillary: 85 mg/dL (ref 70–99)

## 2019-05-21 LAB — SURGICAL PATHOLOGY

## 2019-05-21 IMAGING — PT NM PET TUM IMG INITIAL (PI) SKULL BASE T - THIGH
8 series · 25 of 25 positions shown · non-contrast
Comparison: CT [DATE]

CLINICAL DATA: Initial treatment strategy for Hodgkin's lymphoma.

EXAM:
NUCLEAR MEDICINE PET SKULL BASE TO THIGH
TECHNIQUE: 7.5 mCi F-18 FDG was injected intravenously. Full-ring PET imaging
was performed from the skull base to thigh after the radiotracer. CT
data was obtained and used for attenuation correction and anatomic
localization.
Fasting blood glucose: 85 mg/dl

[Series 3: pet hn_sk_thigh ac · axial · 5.0mm · 4.07mm/px · z∈[-879,+29]mm · 5 of 228 slices shown]
[im 1/228]
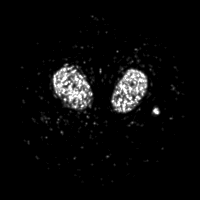
[im 57/228]
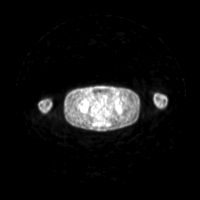
[im 114/228]
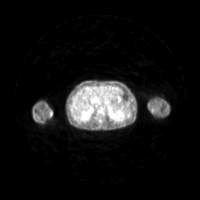
[im 171/228]
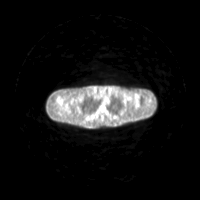
[im 228/228]
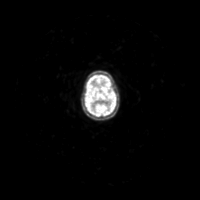

[Series 4: ct hn_sk_th 5.0 b31f · axial · 5.0mm · 0.98mm/px · z∈[-879,+29]mm · 5 of 228 slices shown]
[im 1/228]
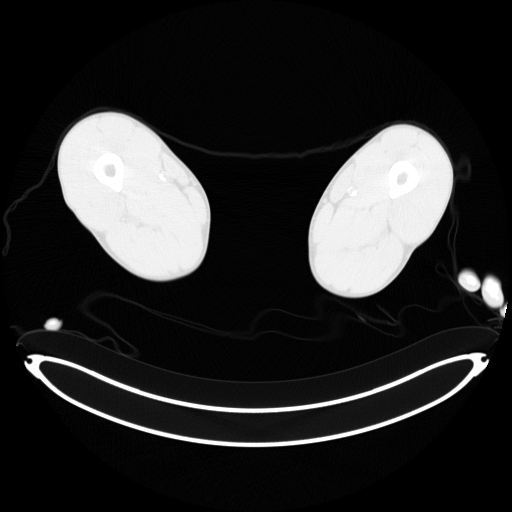
[im 57/228]
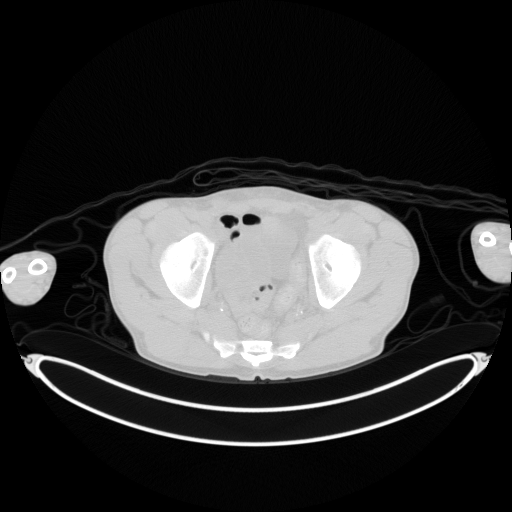
[im 114/228]
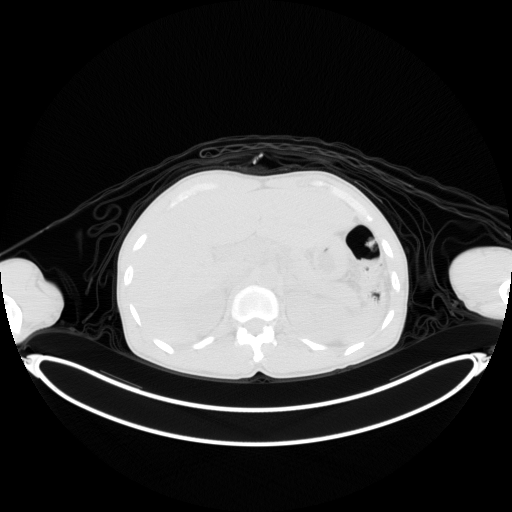
[im 171/228  brain]
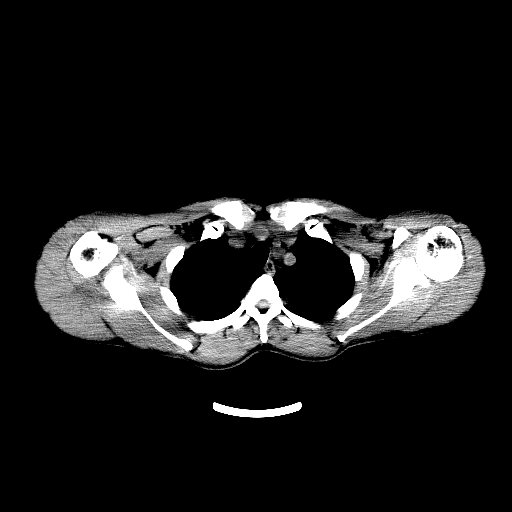
[im 228/228  brain]
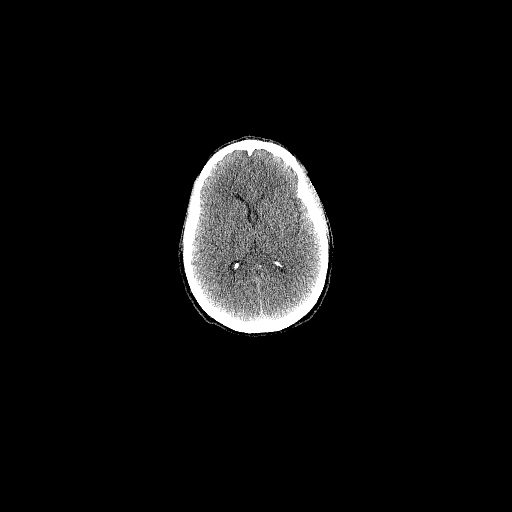

[Series 5: pet hn_sk_thigh nac · axial · 5.0mm · 4.07mm/px · z∈[-879,+29]mm · 5 of 228 slices shown]
[im 1/228]
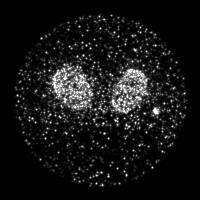
[im 57/228]
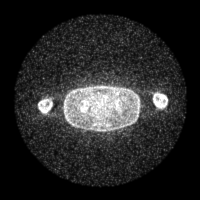
[im 114/228]
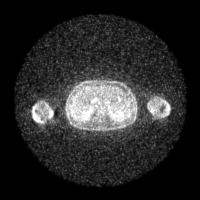
[im 171/228]
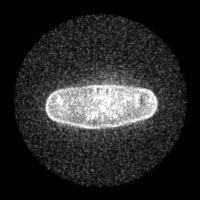
[im 228/228]
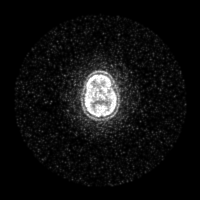

[Series 8: ct hn_sk_th 5.0 (id) lung_bone · axial · 5.0mm · 0.61mm/px · 1 of 63 slices shown]
[im 1/63  bone]
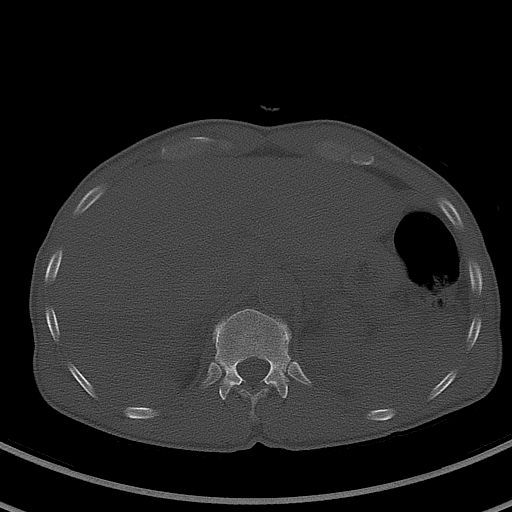

[Series 603: range-ct hn_sk_th 5.0 (id)<alpha range> · 2 of 70 slices shown (1 of 2)]
[im 1/70]
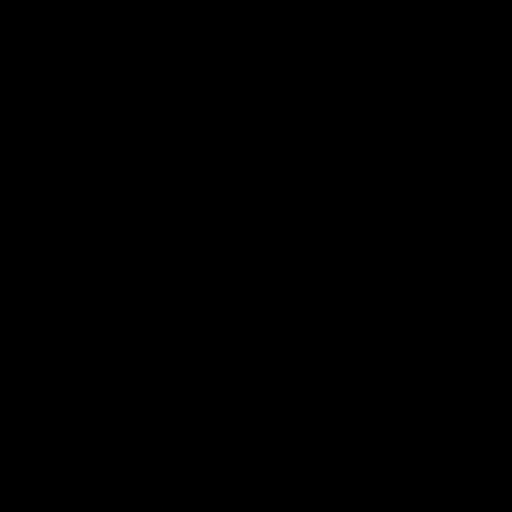
[im 70/70]
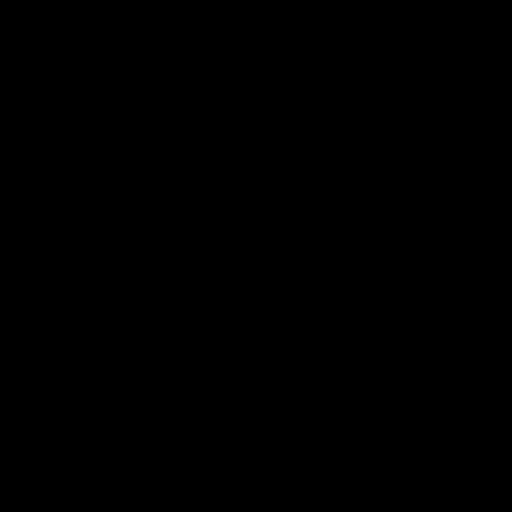

[Series 604: mip range 2 · coronal · 1.88mm/px · 1 of 32 slices shown]
[im 1/32]
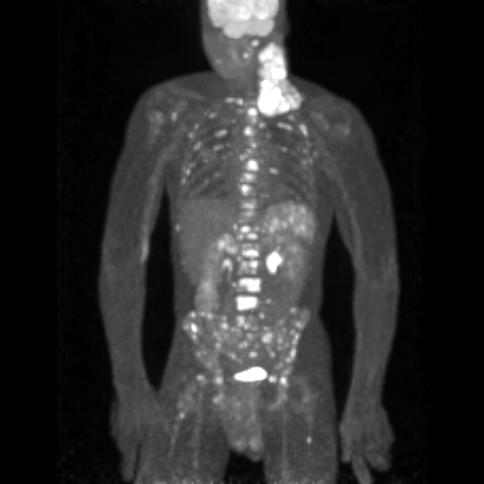

[Series 605: range-ct hn_sk_th 5.0 (id)<alpha range> · 5 of 222 slices shown (2 of 2)]
[im 1/222]
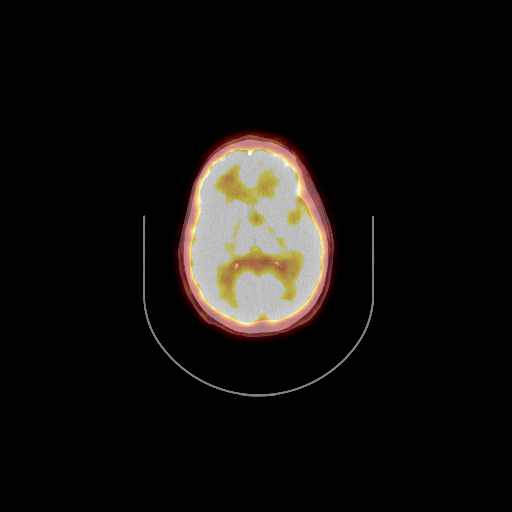
[im 56/222]
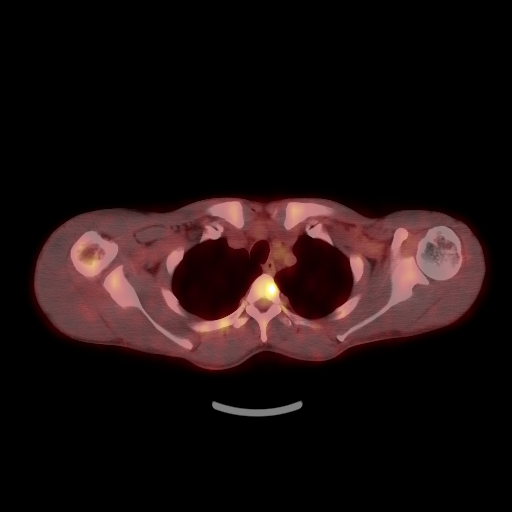
[im 111/222]
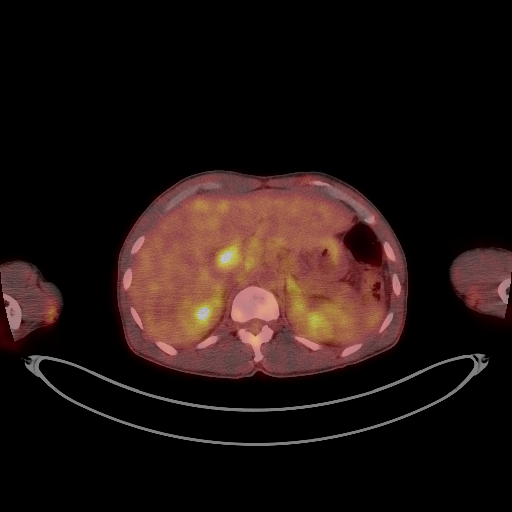
[im 166/222]
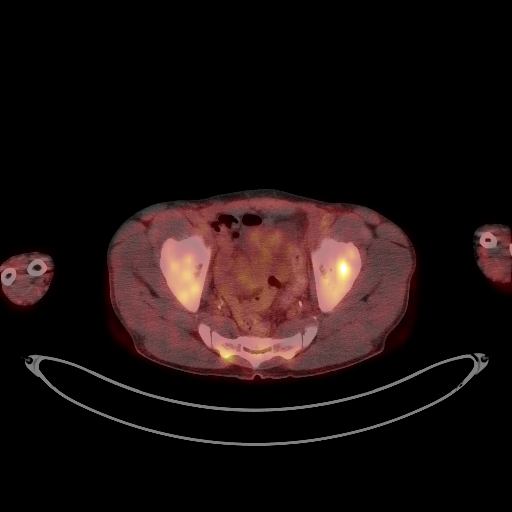
[im 222/222]
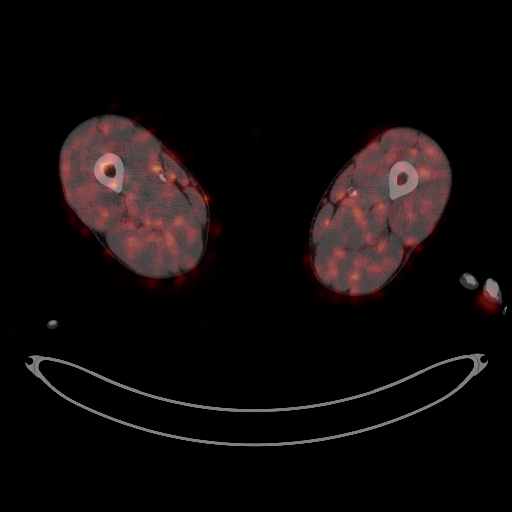

[Series 8057: results mm oncology reading · 1.0mm · 1.00mm/px · 1 of 10 slices shown]
[im 1/10]
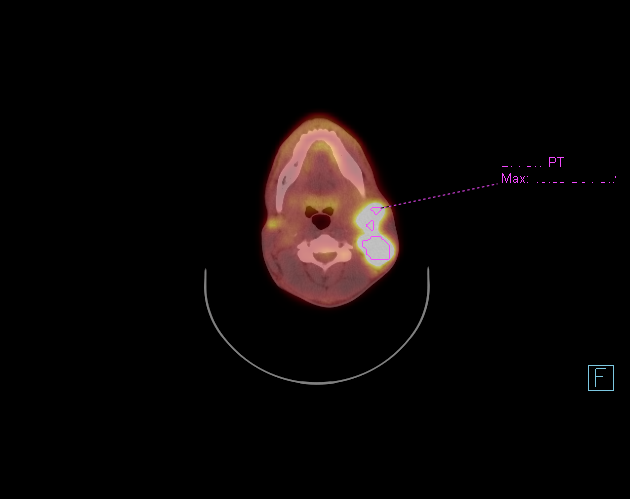

[25 of 25 positions shown; findings below may reference images not displayed]

FINDINGS: Mediastinal blood pool activity: SUV max

Liver activity: SUV max

NECK: Bulky hypermetabolic nodal mass extending from the LEFT level
II nodal station to involve the level III nodal stations, level V
nodal stations and LEFT supraclavicular nodal stations. At the level
of LEFT level 2 nodes, the nodes measure up to 3 cm short axis with
SUV max equal 19.0. The level of the the LEFT supraclavicular nodes,
nodes measure up to 2.8 cm SUV max equal 23.6.

A single small hypermetabolic RIGHT level 2 lymph node with SUV max
equal 3.5.

Incidental CT findings: None

CHEST: No hypermetabolic mediastinal lymph nodes. No suspicious
pulmonary nodules.

Incidental CT findings: none

ABDOMEN/PELVIS: There is several discrete foci of hypermetabolic
activity within the spleen which are poorly defined on the
noncontrast CT. Lesion in the medial spleen with SUV max equal 7.8.
Second lesion in the posterior spleen with SUV max equal 7.8. Spleen
is normal volume.

No focal activity within the liver.

There are hypermetabolic periportal lymph nodes with SUV max equal
7.9.

Small hypermetabolic upper periaortic lymph nodes present. No
hypermetabolic iliac or inguinal nodes.

Incidental CT findings: No splenomegaly

SKELETON: There are multiple intense foci of radiotracer activity
throughout the spine, ribs, pelvis. For example lesion at the L4
vertebral body level with SUV max equal 17. Lesion in the LEFT
sacrum with SUV max equal 9.5. They are approximately 50 lesions
within the skeleton. These lesions are occult by CT imaging.

Incidental CT findings: No fracture.
IMPRESSION: 1. Intensely hypermetabolic nodal mass in the LEFT neck consistent
with lymphoma.
2. Minimally enlarged hypermetabolic nodal disease in the porta
hepatis and along the aorta.
3. While the spleen is normal volume, several foci of metabolic
activity in the spleen consistent with lymphoma.
4. Extensive intensely hypermetabolic metastasis within the marrow
space of the pelvis, spine and ribs. Lesions are CT occult.

## 2019-05-21 MED ORDER — FLUDEOXYGLUCOSE F - 18 (FDG) INJECTION
7.5000 | Freq: Once | INTRAVENOUS | Status: AC | PRN
  Administered 2019-05-21: 7.5 via INTRAVENOUS

## 2019-05-21 NOTE — Progress Notes (Signed)
  Echocardiogram 2D Echocardiogram has been performed.  Jesse [name] G Jesse [name] 05/21/2019, 10:10 AM

## 2019-05-22 ENCOUNTER — Ambulatory Visit (HOSPITAL_COMMUNITY)
Admission: RE | Admit: 2019-05-22 | Discharge: 2019-05-22 | Disposition: A | Discharge status: Home / Self Care | Payer: Medicaid Other | Source: Ambulatory Visit | Attending: Physician Assistant | Admitting: Physician Assistant

## 2019-05-22 DIAGNOSIS — R590 Localized enlarged lymph nodes: Secondary | ICD-10-CM | POA: Insufficient documentation

## 2019-05-22 LAB — PULMONARY FUNCTION TEST
DL/VA % pred: 116 %
DL/VA: 5.12 ml/min/mmHg/L
DLCO cor % pred: 101 %
DLCO cor: 25.43 ml/min/mmHg
DLCO unc % pred: 95 %
DLCO unc: 24.04 ml/min/mmHg
FEF 25-75 Post: 3.3 L/sec
FEF 25-75 Pre: 2.83 L/sec
FEF2575-%Change-Post: 16 %
FEF2575-%Pred-Post: 119 %
FEF2575-%Pred-Pre: 102 %
FEV1-%Change-Post: 3 %
FEV1-%Pred-Post: 104 %
FEV1-%Pred-Pre: 101 %
FEV1-Post: 2.91 L
FEV1-Pre: 2.82 L
FEV1FVC-%Change-Post: 3 %
FEV1FVC-%Pred-Pre: 101 %
FEV6-%Change-Post: 0 %
FEV6-%Pred-Post: 103 %
FEV6-%Pred-Pre: 102 %
FEV6-Post: 3.5 L
FEV6-Pre: 3.48 L
FEV6FVC-%Change-Post: 0 %
FEV6FVC-%Pred-Post: 103 %
FEV6FVC-%Pred-Pre: 103 %
FVC-%Change-Post: 0 %
FVC-%Pred-Post: 99 %
FVC-%Pred-Pre: 99 %
FVC-Post: 3.5 L
FVC-Pre: 3.5 L
Post FEV1/FVC ratio: 83 %
Post FEV6/FVC ratio: 100 %
Pre FEV1/FVC ratio: 81 %
Pre FEV6/FVC Ratio: 99 %
RV % pred: 80 %
RV: 1.55 L
TLC % pred: 82 %
TLC: 5.11 L

## 2019-05-22 MED ORDER — ALBUTEROL SULFATE (2.5 MG/3ML) 0.083% IN NEBU
2.5000 mg | INHALATION_SOLUTION | Freq: Once | RESPIRATORY_TRACT | Status: AC
  Administered 2019-05-22: 13:00:00 2.5 mg via RESPIRATORY_TRACT

## 2019-05-24 ENCOUNTER — Other Ambulatory Visit: Payer: Self-pay

## 2019-05-24 ENCOUNTER — Inpatient Hospital Stay (HOSPITAL_BASED_OUTPATIENT_CLINIC_OR_DEPARTMENT_OTHER): Payer: Medicaid Other | Admitting: Physician Assistant

## 2019-05-24 ENCOUNTER — Telehealth: Payer: Self-pay | Admitting: Physician Assistant

## 2019-05-24 ENCOUNTER — Encounter (HOSPITAL_COMMUNITY): Payer: Self-pay | Admitting: Internal Medicine

## 2019-05-24 ENCOUNTER — Encounter: Payer: Self-pay | Admitting: Physician Assistant

## 2019-05-24 ENCOUNTER — Other Ambulatory Visit: Payer: Self-pay | Admitting: Internal Medicine

## 2019-05-24 VITALS — BP 157/87 | HR 96 | Temp 100.2°F | Resp 18 | Ht 66.0 in | Wt 139.9 lb

## 2019-05-24 DIAGNOSIS — Z5111 Encounter for antineoplastic chemotherapy: Secondary | ICD-10-CM | POA: Insufficient documentation

## 2019-05-24 DIAGNOSIS — Z5112 Encounter for antineoplastic immunotherapy: Secondary | ICD-10-CM | POA: Diagnosis not present

## 2019-05-24 DIAGNOSIS — Z7189 Other specified counseling: Secondary | ICD-10-CM

## 2019-05-24 DIAGNOSIS — G893 Neoplasm related pain (acute) (chronic): Secondary | ICD-10-CM | POA: Insufficient documentation

## 2019-05-24 DIAGNOSIS — C8118 Nodular sclerosis classical Hodgkin lymphoma, lymph nodes of multiple sites: Secondary | ICD-10-CM

## 2019-05-24 MED ORDER — ALLOPURINOL 100 MG PO TABS: 100.0000 mg | ORAL_TABLET | Freq: Two times a day (BID) | ORAL | 2 refills | Status: DC

## 2019-05-24 MED ORDER — PROCHLORPERAZINE MALEATE 10 MG PO TABS: 10.0000 mg | ORAL_TABLET | Freq: Three times a day (TID) | ORAL | 2 refills | Status: DC | PRN

## 2019-05-24 MED ORDER — OXYCODONE-ACETAMINOPHEN 5-325 MG PO TABS: 1.0000 | ORAL_TABLET | Freq: Three times a day (TID) | ORAL | 0 refills | Status: DC | PRN

## 2019-05-24 NOTE — Telephone Encounter (Signed)
Scheduled appt per 10/8 los - unable to reach pt . Left message with appt date and time for 1st two appts

## 2019-05-24 NOTE — Patient Instructions (Addendum)
-Treatment will start next week ideally on 05/29/2019. We will arrange for you to have a chemotherapy education class. What this is is that you will sit down one on one with one of the nurse educators who will discuss in more details information regarding resources here at the cancer center as well as more details about your treatment -Treatment will be every 2 weeks with a regimen called BV+AVD. We will check your labs every week to make sure your numbers are safe for treatment.  -Please drink plenty of fluid to help the toxins get out of your kidney.  -I am sending a few prescriptions to your pharmacy. I have sent pain medication to your pharmacy that you may take every 8 hours as needed. I have also sent nausea medication called compazine that you may take every 6 hours as needed. I have also sent a medication to prevent a gout flare up that you take twice daily.   Vinblastine injection What is this medicine? VINBLASTINE (vin BLAS teen) is a chemotherapy drug. It slows the growth of cancer cells. This medicine is used to treat many types of cancer like breast cancer, testicular cancer, Hodgkin's disease, non-Hodgkin's lymphoma, and sarcoma. This medicine may be used for other purposes; ask your health care provider or pharmacist if you have questions. COMMON BRAND NAME(S): Velban What should I tell my health care provider before I take this medicine? They need to know if you have any of these conditions:  blood disorders  dental disease  gout  infection (especially a virus infection such as chickenpox, cold sores, or herpes)  liver disease  lung disease  nervous system disease  recent or ongoing radiation therapy  an unusual or allergic reaction to vinblastine, other chemotherapy agents, other medicines, foods, dyes, or preservatives  pregnant or trying to get pregnant  breast-feeding How should I use this medicine? This drug is given as an infusion into a vein. It is  administered in a hospital or clinic by a specially trained health care professional. If you have pain, swelling, burning or any unusual feeling around the site of your injection, tell your health care professional right away. Talk to your pediatrician regarding the use of this medicine in children. While this drug may be prescribed for selected conditions, precautions do apply. Overdosage: If you think you have taken too much of this medicine contact a poison control center or emergency room at once. NOTE: This medicine is only for you. Do not share this medicine with others. What if I miss a dose? It is important not to miss your dose. Call your doctor or health care professional if you are unable to keep an appointment. What may interact with this medicine? Do not take this medicine with any of the following medications:  erythromycin  itraconazole  mibefradil  voriconazole This medicine may also interact with the following medications:  cyclosporine  fluconazole  ketoconazole  medicines for seizures like phenytoin  medicines to increase blood counts like filgrastim, pegfilgrastim, sargramostim  vaccines  verapamil Talk to your doctor or health care professional before taking any of these medicines:  acetaminophen  aspirin  ibuprofen  ketoprofen  naproxen This list may not describe all possible interactions. Give your health care provider a list of all the medicines, herbs, non-prescription drugs, or dietary supplements you use. Also tell them if you smoke, drink alcohol, or use illegal drugs. Some items may interact with your medicine. What should I watch for while using this medicine? Your  condition will be monitored carefully while you are receiving this medicine. You will need important blood work done while you are taking this medicine. This drug may make you feel generally unwell. This is not uncommon, as chemotherapy can affect healthy cells as well as cancer  cells. Report any side effects. Continue your course of treatment even though you feel ill unless your doctor tells you to stop. In some cases, you may be given additional medicines to help with side effects. Follow all directions for their use. Call your doctor or health care professional for advice if you get a fever, chills or sore throat, or other symptoms of a cold or flu. Do not treat yourself. This drug decreases your body's ability to fight infections. Try to avoid being around people who are sick. This medicine may increase your risk to bruise or bleed. Call your doctor or health care professional if you notice any unusual bleeding. Be careful brushing and flossing your teeth or using a toothpick because you may get an infection or bleed more easily. If you have any dental work done, tell your dentist you are receiving this medicine. Avoid taking products that contain aspirin, acetaminophen, ibuprofen, naproxen, or ketoprofen unless instructed by your doctor. These medicines may hide a fever. Do not become pregnant while taking this medicine. Women should inform their doctor if they wish to become pregnant or think they might be pregnant. There is a potential for serious side effects to an unborn child. Talk to your health care professional or pharmacist for more information. Do not breast-feed an infant while taking this medicine. Men may have a lower sperm count while taking this medicine. Talk to your doctor if you plan to father a child. What side effects may I notice from receiving this medicine? Side effects that you should report to your doctor or health care professional as soon as possible:  allergic reactions like skin rash, itching or hives, swelling of the face, lips, or tongue  low blood counts - This drug may decrease the number of white blood cells, red blood cells and platelets. You may be at increased risk for infections and bleeding.  signs of infection - fever or chills,  cough, sore throat, pain or difficulty passing urine  signs of decreased platelets or bleeding - bruising, pinpoint red spots on the skin, black, tarry stools, nosebleeds  signs of decreased red blood cells - unusually weak or tired, fainting spells, lightheadedness  breathing problems  changes in hearing  change in the amount of urine  chest pain  high blood pressure  mouth sores  nausea and vomiting  pain, swelling, redness or irritation at the injection site  pain, tingling, numbness in the hands or feet  problems with balance, dizziness  seizures Side effects that usually do not require medical attention (report to your doctor or health care professional if they continue or are bothersome):  constipation  hair loss  jaw pain  loss of appetite  sensitivity to light  stomach pain  tumor pain This list may not describe all possible side effects. Call your doctor for medical advice about side effects. You may report side effects to FDA at 1-800-FDA-1088. Where should I keep my medicine? This drug is given in a hospital or clinic and will not be stored at home. NOTE: This sheet is a summary. It may not cover all possible information. If you have questions about this medicine, talk to your doctor, pharmacist, or health care provider.  2020  Elsevier/Gold Standard (2008-04-29 17:15:59) Dacarbazine, DTIC injection What is this medicine? DACARBAZINE (da KAR ba zeen) is a chemotherapy drug. This medicine is used to treat skin cancer. It is also used with other medicines to treat Hodgkin's disease. This medicine may be used for other purposes; ask your health care provider or pharmacist if you have questions. COMMON BRAND NAME(S): DTIC-Dome What should I tell my health care provider before I take this medicine? They need to know if you have any of these conditions:  infection (especially virus infection such as chickenpox, cold sores, or herpes)  kidney disease   liver disease  low blood counts like low platelets, red blood cells, white blood cells  recent radiation therapy  an unusual or allergic reaction to dacarbazine, other chemotherapy agents, other medicines, foods, dyes, or preservatives  pregnant or trying to get pregnant  breast-feeding How should I use this medicine? This drug is given as an injection or infusion into a vein. It is administered in a hospital or clinic by a specially trained health care professional. Talk to your pediatrician regarding the use of this medicine in children. While this drug may be prescribed for selected conditions, precautions do apply. Overdosage: If you think you have taken too much of this medicine contact a poison control center or emergency room at once. NOTE: This medicine is only for you. Do not share this medicine with others. What if I miss a dose? It is important not to miss your dose. Call your doctor or health care professional if you are unable to keep an appointment. What may interact with this medicine?  medicines to increase blood counts like filgrastim, pegfilgrastim, sargramostim  vaccines This list may not describe all possible interactions. Give your health care provider a list of all the medicines, herbs, non-prescription drugs, or dietary supplements you use. Also tell them if you smoke, drink alcohol, or use illegal drugs. Some items may interact with your medicine. What should I watch for while using this medicine? Your condition will be monitored carefully while you are receiving this medicine. You will need important blood work done while you are taking this medicine. This drug may make you feel generally unwell. This is not uncommon, as chemotherapy can affect healthy cells as well as cancer cells. Report any side effects. Continue your course of treatment even though you feel ill unless your doctor tells you to stop. Call your doctor or health care professional for advice if you  get a fever, chills or sore throat, or other symptoms of a cold or flu. Do not treat yourself. This drug decreases your body's ability to fight infections. Try to avoid being around people who are sick. This medicine may increase your risk to bruise or bleed. Call your doctor or health care professional if you notice any unusual bleeding. Talk to your doctor about your risk of cancer. You may be more at risk for certain types of cancers if you take this medicine. Do not become pregnant while taking this medicine. Women should inform their doctor if they wish to become pregnant or think they might be pregnant. There is a potential for serious side effects to an unborn child. Talk to your health care professional or pharmacist for more information. Do not breast-feed an infant while taking this medicine. What side effects may I notice from receiving this medicine? Side effects that you should report to your doctor or health care professional as soon as possible:  allergic reactions like skin rash, itching  or hives, swelling of the face, lips, or tongue  low blood counts - this medicine may decrease the number of white blood cells, red blood cells and platelets. You may be at increased risk for infections and bleeding.  signs of infection - fever or chills, cough, sore throat, pain or difficulty passing urine  signs of decreased platelets or bleeding - bruising, pinpoint red spots on the skin, black, tarry stools, blood in the urine  signs of decreased red blood cells - unusually weak or tired, fainting spells, lightheadedness  breathing problems  muscle pains  pain at site where injected  trouble passing urine or change in the amount of urine  vomiting  yellowing of the eyes or skin Side effects that usually do not require medical attention (report to your doctor or health care professional if they continue or are bothersome):  diarrhea  hair loss  loss of appetite  nausea  skin  more sensitive to sun or ultraviolet light  stomach upset This list may not describe all possible side effects. Call your doctor for medical advice about side effects. You may report side effects to FDA at 1-800-FDA-1088. Where should I keep my medicine? This drug is given in a hospital or clinic and will not be stored at home. NOTE: This sheet is a summary. It may not cover all possible information. If you have questions about this medicine, talk to your doctor, pharmacist, or health care provider.  2020 Elsevier/Gold Standard (2015-10-03 15:17:39) Doxorubicin injection What is this medicine? DOXORUBICIN (dox oh ROO bi sin) is a chemotherapy drug. It is used to treat many kinds of cancer like leukemia, lymphoma, neuroblastoma, sarcoma, and Wilms' tumor. It is also used to treat bladder cancer, breast cancer, lung cancer, ovarian cancer, stomach cancer, and thyroid cancer. This medicine may be used for other purposes; ask your health care provider or pharmacist if you have questions. COMMON BRAND NAME(S): Adriamycin, Adriamycin PFS, Adriamycin RDF, Rubex What should I tell my health care provider before I take this medicine? They need to know if you have any of these conditions:  heart disease  history of low blood counts caused by a medicine  liver disease  recent or ongoing radiation therapy  an unusual or allergic reaction to doxorubicin, other chemotherapy agents, other medicines, foods, dyes, or preservatives  pregnant or trying to get pregnant  breast-feeding How should I use this medicine? This drug is given as an infusion into a vein. It is administered in a hospital or clinic by a specially trained health care professional. If you have pain, swelling, burning or any unusual feeling around the site of your injection, tell your health care professional right away. Talk to your pediatrician regarding the use of this medicine in children. Special care may be needed. Overdosage:  If you think you have taken too much of this medicine contact a poison control center or emergency room at once. NOTE: This medicine is only for you. Do not share this medicine with others. What if I miss a dose? It is important not to miss your dose. Call your doctor or health care professional if you are unable to keep an appointment. What may interact with this medicine? This medicine may interact with the following medications:  6-mercaptopurine  paclitaxel  phenytoin  St. John's Wort  trastuzumab  verapamil This list may not describe all possible interactions. Give your health care provider a list of all the medicines, herbs, non-prescription drugs, or dietary supplements you use. Also  tell them if you smoke, drink alcohol, or use illegal drugs. Some items may interact with your medicine. What should I watch for while using this medicine? This drug may make you feel generally unwell. This is not uncommon, as chemotherapy can affect healthy cells as well as cancer cells. Report any side effects. Continue your course of treatment even though you feel ill unless your doctor tells you to stop. There is a maximum amount of this medicine you should receive throughout your life. The amount depends on the medical condition being treated and your overall health. Your doctor will watch how much of this medicine you receive in your lifetime. Tell your doctor if you have taken this medicine before. You may need blood work done while you are taking this medicine. Your urine may turn red for a few days after your dose. This is not blood. If your urine is dark or brown, call your doctor. In some cases, you may be given additional medicines to help with side effects. Follow all directions for their use. Call your doctor or health care professional for advice if you get a fever, chills or sore throat, or other symptoms of a cold or flu. Do not treat yourself. This drug decreases your body's ability to  fight infections. Try to avoid being around people who are sick. This medicine may increase your risk to bruise or bleed. Call your doctor or health care professional if you notice any unusual bleeding. Talk to your doctor about your risk of cancer. You may be more at risk for certain types of cancers if you take this medicine. Do not become pregnant while taking this medicine or for 6 months after stopping it. Women should inform their doctor if they wish to become pregnant or think they might be pregnant. Men should not father a child while taking this medicine and for 6 months after stopping it. There is a potential for serious side effects to an unborn child. Talk to your health care professional or pharmacist for more information. Do not breast-feed an infant while taking this medicine. This medicine has caused ovarian failure in some women and reduced sperm counts in some men This medicine may interfere with the ability to have a child. Talk with your doctor or health care professional if you are concerned about your fertility. This medicine may cause a decrease in Co-Enzyme Q-10. You should make sure that you get enough Co-Enzyme Q-10 while you are taking this medicine. Discuss the foods you eat and the vitamins you take with your health care professional. What side effects may I notice from receiving this medicine? Side effects that you should report to your doctor or health care professional as soon as possible:  allergic reactions like skin rash, itching or hives, swelling of the face, lips, or tongue  breathing problems  chest pain  fast or irregular heartbeat  low blood counts - this medicine may decrease the number of white blood cells, red blood cells and platelets. You may be at increased risk for infections and bleeding.  pain, redness, or irritation at site where injected  signs of infection - fever or chills, cough, sore throat, pain or difficulty passing urine  signs of  decreased platelets or bleeding - bruising, pinpoint red spots on the skin, black, tarry stools, blood in the urine  swelling of the ankles, feet, hands  tiredness  weakness Side effects that usually do not require medical attention (report to your doctor or health care professional  if they continue or are bothersome):  diarrhea  hair loss  mouth sores  nail discoloration or damage  nausea  red colored urine  vomiting This list may not describe all possible side effects. Call your doctor for medical advice about side effects. You may report side effects to FDA at 1-800-FDA-1088. Where should I keep my medicine? This drug is given in a hospital or clinic and will not be stored at home. NOTE: This sheet is a summary. It may not cover all possible information. If you have questions about this medicine, talk to your doctor, pharmacist, or health care provider.  2020 Elsevier/Gold Standard (2017-03-16 11:01:26)   Hodgkin Lymphoma, Adult  Hodgkin lymphoma, also called Hodgkin disease, is a cancer that affects the lymphatic system. The lymphatic system is part of the body's defense system (immune system), which protects the body from infections, germs, and diseases. Hodgkin lymphoma often affects white blood cells and the lymph nodes. Hodgkin lymphoma can spread from lymph node to lymph node and to areas of the body where there is lymph tissue, including to the center of the bones (bone marrow). Advanced Hodgkin lymphoma can spread into blood vessels and be carried almost anywhere in the body. There are two types of Hodgkin lymphoma:  Classical Hodgkin lymphoma.  Nodular lymphocyte-predominant Hodgkin lymphoma (NLPHL). What are the causes? The cause is not known. What increases the risk? You may be more likely to develop Hodgkin lymphoma if:  You are male.  You have been infected with the virus that causes mononucleosis (Epstein-Barr virus).  You have a brother or sister with  Hodgkin lymphoma.  You have a weakened immune system.  You have HIV.  You have a personal or family history of autoimmune conditions such as rheumatoid arthritis, systemic lupus erythematosus (SLE), or sarcoidosis. What are the signs or symptoms? The first sign of Hodgkin lymphoma is often a painless swelling in a lymph node. The swelling may be felt in the neck, under the arm, or in the groin. Other symptoms include:  Fever.  Drenching night sweats.  Feeling tired all the time.  Cough.  Shortness of breath.  Itchy skin.  Loss of appetite.  Weight loss.  Pain in the lymph nodes after drinking alcohol. How is this diagnosed? This condition may be diagnosed based on your medical history and symptoms, a physical exam, and a procedure in which a tissue sample is removed from a lymph node and then examined under a microscope (biopsy). You may also have other tests to find out how advanced the cancer is and whether it has spread. This is called staging. These tests may include:  Blood tests.  Imaging tests, such as: ? A chest X-ray. ? A CT scan. ? An MRI. ? A PET scan.  A bone marrow biopsy to see if the disease has spread to the bone marrow. How is this treated? Your treatment will depend on the type of Hodgkin lymphoma you have, the stage of your cancer, your age, and your overall health. Treatment usually starts with one of the following:  Chemotherapy. Chemotherapy is the use of medicines to stop or slow the growth of cancer cells.  Radiation therapy. Radiation therapy is the use of high-energy X-rays to kill cancer cells.  A combination of chemotherapy and radiation therapy. If chemotherapy and radiation therapy are not completely successful, you may have treatment with:  Targeted therapy. This treatment targets specific parts of cancer cells and the area around them to block the growth  and spread of cancer.  Very high doses of chemotherapy followed by a stem cell  transplant. Stem cells are cells that help your body develop new healthy blood cells after chemotherapy. Follow these instructions at home:  Eating and drinking  Do not take dietary supplements or herbal medicines unless your health care provider tells you to take them. Some supplements can interfere with how well the treatment works.  Try to eat regular, healthy meals. Some of your treatments might affect your appetite. Lifestyle   Make sure you are getting enough sleep on a regular basis. Most adults need 6-8 hours of sleep each night. During treatment, you may need more sleep.  Consider joining a cancer support group. Ask your health care provider for more information about local and online support groups.  Do not use any products that contain nicotine or tobacco, such as cigarettes, e-cigarettes, and chewing tobacco. If you need help quitting, ask your health care provider. General instructions  Take over-the-counter and prescription medicines only as directed by your health care provider.  Keep all follow-up visits as told by your health care provider. This is important during and after treatment.  Cancer treatment may increase your risk for other cancers and infections. After treatment: ? Make sure you get all vaccinations as recommended by your health care provider. ? Have regular cancer screenings as recommended by your health care provider. Where to find more information  American Cancer Society (ACS): www.cancer.org  Leukemia and Lymphoma Society (LLS): PreviewPal.pl  National Cancer Institute ([place]): www.cancer.gov Contact a health care provider if:  You have a fever or other signs of infection.  You develop new symptoms or your old symptoms come back. Get help right away if:  You have chest pain.  You have trouble breathing. Summary  Hodgkin lymphoma, also called Hodgkin disease, is a cancer that affects the lymphatic system.  The first sign of Hodgkin lymphoma  is often a painless swelling in a lymph node. The swelling may be felt in the neck, under the arm, or in the groin.  Your treatment will depend on the type of cancer cells you have, the stage of your cancer, your age, and your overall health. Treatment may include a combination of chemotherapy and radiation therapy.  Keep all follow-up visits as told by your health care provider. This is important during and after treatment. This information is not intended to replace advice given to you by your health care provider. Make sure you discuss any questions you have with your health care provider. Document Released: 11/09/2007 Document Revised: 05/18/2018 Document Reviewed: 05/18/2018 Elsevier Patient Education  2020 [employer].

## 2019-05-24 NOTE — Progress Notes (Signed)
START ON PATHWAY REGIMEN - Lymphoma and CLL     A cycle is every 28 days:     Doxorubicin      Vinblastine      Dacarbazine      Brentuximab vedotin   **Always confirm dose/schedule in your pharmacy ordering system**  Patient Characteristics: Classical Hodgkin Lymphoma, First Line, Stage III / IV, Age < 9 Disease Type: Not Applicable Disease Type: Not Applicable Disease Type: Classical Hodgkin Lymphoma Line of therapy: First Line [place] Stage: IVB Age: < 39 Intent of Therapy: Curative Intent, Discussed with Patient

## 2019-05-24 NOTE — Progress Notes (Signed)
Jesse [name] OFFICE PROGRESS NOTE  Patient, No Pcp Per No address on file  DIAGNOSIS: Stage IVB Hodgkin's Lymphoma, nodular sclerosis subtype. He presented with a bulky left cervical and supraclavicular lymphadenopathy as well as involvement of the spleen, nodal disease in the porta hepatis, and extensive disease involving the marrow of the pelvis, spine, and ribs. He was diagnosed in September 2020.   IPS score: 4   PRIOR THERAPY: None  CURRENT THERAPY: Systemic chemotherapy with brentuximab vedotin 1.2 mg/kg, doxorubicin 25 mg/m2, vinblastine 6 mg/m2, dacarbazine 375 mg/m2 on days 1 and 15 every 28 days. First dose expected on 05/31/2019.   INTERVAL HISTORY: Jesse [name] 53 y.o. male returns to the clinic for follow-up visit.  The patient was recently diagnosed with Hodgkin's lymphoma and is feeling generally unwell today.  The patient has been experiencing worsening intermittent fevers and night sweats over the last few weeks.  The patient also endorses a decreased appetite; however, he has not lost any significant amount of weight since his last appointment. He denies any recent skin infections, dysuria, nasal congestion, or sore throat. He denies any chest pain, shortness of breath, cough, or hemoptysis. He denies any bleeding or bruising including melena, hematochezia, hematuria, epistaxis, or gingival bleeding. He denies any nausea, vomiting, diarrhea, or constipation.  He denies any rashes or itching.  Denies any headache or visual changes. The patient endorses some persistent low back pain after having a bone marrow biopsy performed last week.  The patient states that he has been taking extra strength Tylenol as well as Advil with minimal relief of his pain. The patient recently had further lab work and imaging studies performed recently to complete the staging workup. He has had several lab studies including CBC, CMP, LDH, uric acid, acute hepatitis panel, HIV  antibody, hep B virus serology, ESR, and beta-2 microglobulin. He also recently had a PET scan, PFT's, echocardiogram, as well as a bone marrow biopsy performed. He is here today for evaluation and to have a more detailed discussion regarding his treatment based on the lab and imaging studies.    MEDICAL HISTORY: Past Medical History:  Diagnosis Date  . ETOH abuse   . Lymphadenopathy    left  . Marijuana abuse     ALLERGIES:  has No Known Allergies.  MEDICATIONS:  Current Outpatient Medications  Medication Sig Dispense Refill  . acetaminophen (TYLENOL) 500 MG tablet Take 1,000 mg by mouth every 6 (six) hours as needed.    Marland Kitchen ibuprofen (ADVIL) 800 MG tablet Take 800 mg by mouth every 8 (eight) hours as needed.    Marland Kitchen allopurinol (ZYLOPRIM) 100 MG tablet Take 1 tablet (100 mg total) by mouth 2 (two) times daily. 60 tablet 2  . oxyCODONE-acetaminophen (PERCOCET/ROXICET) 5-325 MG tablet Take 1 tablet by mouth every 8 (eight) hours as needed for severe pain. 45 tablet 0  . prochlorperazine (COMPAZINE) 10 MG tablet Take 1 tablet (10 mg total) by mouth every 8 (eight) hours as needed for nausea or vomiting. 30 tablet 2   No current facility-administered medications for this visit.     SURGICAL HISTORY:  Past Surgical History:  Procedure Laterality Date  . HERNIA REPAIR     as child  . LYMPH NODE BIOPSY Left 05/07/2019   Procedure: LYMPH NODE BIOPSY;  Surgeon: Melida Quitter, MD;  Location: [facility];  Service: ENT;  Laterality: Left;    REVIEW OF SYSTEMS:   Review of Systems  Constitutional: Positive for  intermittent fevers, night sweats, and decreased appetite. Negative for chills, fatigue, and unexpected weight change.  HENT: Negative for mouth sores, nosebleeds, sore throat and trouble swallowing.   Eyes: Negative for eye problems and icterus.  Respiratory: Negative for cough, hemoptysis, shortness of breath and wheezing.   Cardiovascular: Negative for chest pain and  leg swelling.  Gastrointestinal: Negative for abdominal pain, constipation, diarrhea, nausea and vomiting.  Genitourinary: Negative for bladder incontinence, difficulty urinating, dysuria, frequency and hematuria.   Musculoskeletal: Positive for low back pain and neck pain secondary to bulky cervical lymphadenopathy. Negative for gait problem, and neck stiffness.  Skin: Negative for itching and rash. No swelling, drainage, or erythema near bone marrow biopsy site. Neurological: Negative for dizziness, extremity weakness, gait problem, headaches, light-headedness and seizures.  Hematological: Positive for large left cervical and supraclavicular lymphadenopathy. Does not bruise/bleed easily.  Psychiatric/Behavioral: The patient is tearful during the encounter today. Negative for confusion and sleep disturbance.   PHYSICAL EXAMINATION:  Blood pressure (!) 157/87, pulse 96, temperature 100.2 F (37.9 C), temperature source Oral, resp. rate 18, height 5' 6"  (1.676 m), weight 139 lb 14.4 oz (63.5 kg), SpO2 100 %.  ECOG PERFORMANCE STATUS: 1 - Symptomatic but completely ambulatory  Physical Exam  Constitutional: Oriented to person, place, and time and thin-appearing male and in no distress. HENT:  HENT:  Head: Normocephalic and atraumatic.  Mouth/Throat: Oropharynx is clear and moist. No oropharyngeal exudate.  Eyes: Conjunctivae are normal. Right eye exhibits no discharge. Left eye exhibits no discharge. No scleral icterus.  Neck: Visibly large cervical and supraclavicular lymphadenopathy. Cardiovascular: Normal rate, regular rhythm, normal heart sounds and intact distal pulses.   Pulmonary/Chest: Effort normal and breath sounds normal. No respiratory distress. No wheezes. No rales.  Abdominal: Soft. Bowel sounds are normal. Exhibits no distension and no mass. There is no tenderness.  Musculoskeletal: Normal range of motion. Exhibits no edema.  Lymphadenopathy:    Positive for multiple enlarged  left supraclavicular, lower cervical, and axillary lymphadenopathy.   Neurological: Alert and oriented to person, place, and time. Exhibits normal muscle tone. Gait normal. Coordination normal.  Skin: Skin is warm and dry. No rash noted. Not diaphoretic. No erythema. No pallor.  Psychiatric: Mood, memory and judgment normal.  Vitals reviewed.  LABORATORY DATA: Lab Results  Component Value Date   WBC 5.6 05/17/2019   HGB 12.8 (L) 05/17/2019   HCT 39.4 05/17/2019   MCV 91.6 05/17/2019   PLT  05/17/2019    PLATELET CLUMPS NOTED ON SMEAR, COUNT APPEARS ADEQUATE      Chemistry      Component Value Date/Time   NA 137 05/10/2019 1226   K 3.7 05/10/2019 1226   CL 101 05/10/2019 1226   CO2 28 05/10/2019 1226   BUN 13 05/10/2019 1226   CREATININE 0.77 05/10/2019 1226      Component Value Date/Time   CALCIUM 8.6 (L) 05/10/2019 1226   ALKPHOS 139 (H) 05/10/2019 1226   AST 14 (L) 05/10/2019 1226   ALT 15 05/10/2019 1226   BILITOT 0.4 05/10/2019 1226       RADIOGRAPHIC STUDIES:  Nm Pet Image Initial (pi) Skull Base To Thigh  Result Date: 05/21/2019 CLINICAL DATA:  Initial treatment strategy for Hodgkin's lymphoma. EXAM: NUCLEAR MEDICINE PET SKULL BASE TO THIGH TECHNIQUE: 7.5 mCi F-18 FDG was injected intravenously. Full-ring PET imaging was performed from the skull base to thigh after the radiotracer. CT data was obtained and used for attenuation correction and anatomic localization. Fasting blood  glucose: 85 mg/dl COMPARISON:  CT 04/18/2019 FINDINGS: Mediastinal blood pool activity: SUV max 2.5 Liver activity: SUV max 2.3 NECK: Bulky hypermetabolic nodal mass extending from the LEFT level II nodal station to involve the level III nodal stations, level V nodal stations and LEFT supraclavicular nodal stations. At the level of LEFT level 2 nodes, the nodes measure up to 3 cm short axis with SUV max equal 19.0. The level of the the LEFT supraclavicular nodes, nodes measure up to 2.8 cm SUV  max equal 23.6. A single small hypermetabolic RIGHT level 2 lymph node with SUV max equal 3.5. Incidental CT findings: None CHEST: No hypermetabolic mediastinal lymph nodes. No suspicious pulmonary nodules. Incidental CT findings: none ABDOMEN/PELVIS: There is several discrete foci of hypermetabolic activity within the spleen which are poorly defined on the noncontrast CT. Lesion in the medial spleen with SUV max equal 7.8. Second lesion in the posterior spleen with SUV max equal 7.8. Spleen is normal volume. No focal activity within the liver. There are hypermetabolic periportal lymph nodes with SUV max equal 7.9. Small hypermetabolic upper periaortic lymph nodes present. No hypermetabolic iliac or inguinal nodes. Incidental CT findings: No splenomegaly SKELETON: There are multiple intense foci of radiotracer activity throughout the spine, ribs, pelvis. For example lesion at the L4 vertebral body level with SUV max equal 17. Lesion in the LEFT sacrum with SUV max equal 9.5. They are approximately 50 lesions within the skeleton. These lesions are occult by CT imaging. Incidental CT findings: No fracture. IMPRESSION: 1. Intensely hypermetabolic nodal mass in the LEFT neck consistent with lymphoma. 2. Minimally enlarged hypermetabolic nodal disease in the porta hepatis and along the aorta. 3. While the spleen is normal volume, several foci of metabolic activity in the spleen consistent with lymphoma. 4. Extensive intensely hypermetabolic metastasis within the marrow space of the pelvis, spine and ribs. Lesions are CT occult. Electronically Signed   By: Suzy Bouchard M.D.   On: 05/21/2019 15:51     ASSESSMENT/PLAN:  This is a very pleasant 53 year old African American male diagnosed with stage IVB Hodgkin's Lymphoma, nodular sclerosis subtype. He presented with a bulky left cervical and supraclavicular lymphadenopathy as well as involvement of the spleen, nodal disease in the porta hepatis, and extensive  disease involving the marrow of the pelvis, spine, and ribs. He was diagnosed in September 2020. IPS score: 4  The patient had several lab and imaging studies performed including CBC, CMP, LDH, uric acid, acute hepatitis panel, HIV antibody, hep B virus serology, ESR, and beta-2 microglobulin. The patient also had a PET scan performed, PFTs, a bone marrow biopsy, and echocardiogram. Dr. Julien Nordmann discussed the results with the patient.   Dr. Julien Nordmann had a lengthy discussion today with the patient about his current condition and treatment options.  Dr. Julien Nordmann recommends that the patient undergo treatment with  brentuximab vedotin 1.2 mg/kg, doxorubicin 25 mg/m2, vinblastine 6 mg/m2, and dacarbazine 375 mg/m2 on days 1 and 15 every 28 days.   The patient is interested in pursuing treatment and he is expected to start his first dose of treatment on 05/31/2019.  The adverse side effects of treatment were discussed with the patient including but not limited to myelosuppression, alopecia, peripheral neuropathy, and cardiac dysfunction.   The patient will come back for a follow-up visit in 3 weeks before starting day 15 cycle 1.  I will arrange for the patient to have a chemo education class prior to starting his first cycle of chemotherapy.  I sent several prescriptions to the patient's pharmacy including Compazine 10 mg p.o. every 6 hours as needed for nausea, Percocet 5-325 every 8 hours as needed for cancer related pain secondary to his widespread metastatic bone involvement, and allopurinol 100 mg p.o. twice daily.  The patient was encouraged to drink plenty of fluids while undergoing treatment.  The patient was advised to call immediately if he has any concerning symptoms in the interval. The patient voices understanding of current disease status and treatment options and is in agreement with the current care plan. All questions were answered. The patient knows to call the clinic with any problems,  questions or concerns. We can certainly see the patient much sooner if necessary   Orders Placed This Encounter  Procedures  . CBC with Differential (Cancer Center Only)    Standing Status:   Standing    Number of Occurrences:   8    Standing Expiration Date:   05/23/2020  . CMP ([facility] only)    Standing Status:   Standing    Number of Occurrences:   8    Standing Expiration Date:   05/23/2020  . Lactate dehydrogenase (LDH)    Standing Status:   Standing    Number of Occurrences:   8    Standing Expiration Date:   05/23/2020     Cassandra L Heilingoetter, PA-C 05/24/19  ADDENDUM: Hematology/Oncology Attending: I had a face-to-face encounter with the patient today.  I recommended his care plan.  This is a very pleasant 53 years old African-American male who was recently diagnosed with Stage IVB Hodgkin's Lymphoma, nodular sclerosis subtype, presented with a bulky left cervical and supraclavicular lymphadenopathy as well as involvement of the spleen, nodal disease in the porta hepatis, and extensive disease involving the marrow of the pelvis, spine, and ribs. He was diagnosed in September 2020. I had a lengthy discussion with the patient today about his current disease stage, prognosis and treatment options. The patient underwent several studies recently including a PET scan that showed extensive disease involving the bulky left cervical and supraclavicular lymphadenopathy as well as involvement of the spleen, abdominal lymph nodes as well as extensive involvement of the bone marrow in the pelvis, spine and ribs. His 2D echo was unremarkable for any abnormality and his pulmonary function tests were good. The patient continues to have a lot of pain in the left neck area as well as all over his body.  We will start the patient on Percocet on as-needed basis. I discussed with the patient his treatment options including systemic chemotherapy with brentuximab vedotin in addition to  Adriamycin, vincristine and dacarbazine on days 1 and 15 every 4 weeks probably for a total of 6 cycles depending on his response. I discussed with the patient the adverse effect of this treatment including but not limited to alopecia, myelosuppression, nausea and vomiting, peripheral neuropathy, liver or renal dysfunction as well as cardiac dysfunction. He is expected to start the first cycle of this treatment next week. We will arrange for the patient to have a chemotherapy education class before the first dose of his treatment. We will start the patient on prophylactic allopurinol for tumor lysis. We will also give the patient prescription for Compazine 10 mg p.o. every 6 hours as needed for nausea. The patient was advised to increase his oral hydration and intake. He will come back for follow-up visit with day 15 of the first cycle. The patient was advised to call immediately if he  has any concerning symptoms in the interval.  Disclaimer: This note was dictated with voice recognition software. Similar sounding words can inadvertently be transcribed and may be missed upon review. Eilleen Kempf, MD 05/24/19

## 2019-05-25 ENCOUNTER — Telehealth: Payer: Self-pay | Admitting: Physician Assistant

## 2019-05-25 ENCOUNTER — Telehealth: Payer: Self-pay

## 2019-05-25 ENCOUNTER — Other Ambulatory Visit: Payer: Self-pay | Admitting: Physician Assistant

## 2019-05-25 DIAGNOSIS — C8118 Nodular sclerosis classical Hodgkin lymphoma, lymph nodes of multiple sites: Secondary | ICD-10-CM

## 2019-05-25 MED ORDER — LIDOCAINE-PRILOCAINE 2.5-2.5 % EX CREA: 1.0000 "application " | TOPICAL_CREAM | CUTANEOUS | 1 refills | Status: DC | PRN

## 2019-05-25 NOTE — Telephone Encounter (Signed)
Spoke to patient's wife, Thayer Headings. Patient scheduled for Port placement on 05/29/19 at 9am. Patient is to be NPO after midnight. Patient's wife verbalized understanding. Informed wife to call office is she or patient has questions.

## 2019-05-25 NOTE — Telephone Encounter (Signed)
Spoke to wife about port-a-cath placement. She knows to be on the look out for a call from interventional radiology regarding scheduling this procedure. I have sent EMLA cream to the pharmacy.

## 2019-05-27 ENCOUNTER — Other Ambulatory Visit: Payer: Self-pay | Admitting: Radiology

## 2019-05-29 ENCOUNTER — Other Ambulatory Visit: Payer: Self-pay

## 2019-05-29 ENCOUNTER — Ambulatory Visit (HOSPITAL_COMMUNITY)
Admission: RE | Admit: 2019-05-29 | Discharge: 2019-05-29 | Disposition: A | Discharge status: Home / Self Care | Payer: Medicaid Other | Source: Ambulatory Visit | Attending: Physician Assistant | Admitting: Physician Assistant

## 2019-05-29 ENCOUNTER — Other Ambulatory Visit: Payer: Self-pay | Admitting: Physician Assistant

## 2019-05-29 ENCOUNTER — Encounter (HOSPITAL_COMMUNITY): Payer: Self-pay

## 2019-05-29 DIAGNOSIS — Z87891 Personal history of nicotine dependence: Secondary | ICD-10-CM | POA: Diagnosis not present

## 2019-05-29 DIAGNOSIS — C8118 Nodular sclerosis classical Hodgkin lymphoma, lymph nodes of multiple sites: Secondary | ICD-10-CM

## 2019-05-29 DIAGNOSIS — Z809 Family history of malignant neoplasm, unspecified: Secondary | ICD-10-CM | POA: Diagnosis not present

## 2019-05-29 HISTORY — PX: IR IMAGING GUIDED PORT INSERTION: IMG5740

## 2019-05-29 LAB — CBC WITH DIFFERENTIAL/PLATELET
Abs Immature Granulocytes: 0.07 10*3/uL (ref 0.00–0.07)
Basophils Absolute: 0 10*3/uL (ref 0.0–0.1)
Basophils Relative: 0 %
Eosinophils Absolute: 0 10*3/uL (ref 0.0–0.5)
Eosinophils Relative: 0 %
HCT: 32.4 % — ABNORMAL LOW (ref 39.0–52.0)
Hemoglobin: 10.2 g/dL — ABNORMAL LOW (ref 13.0–17.0)
Immature Granulocytes: 1 %
Lymphocytes Relative: 25 %
Lymphs Abs: 1.6 10*3/uL (ref 0.7–4.0)
MCH: 29.1 pg (ref 26.0–34.0)
MCHC: 31.5 g/dL (ref 30.0–36.0)
MCV: 92.3 fL (ref 80.0–100.0)
Monocytes Absolute: 1.9 10*3/uL — ABNORMAL HIGH (ref 0.1–1.0)
Monocytes Relative: 29 %
Neutro Abs: 2.8 10*3/uL (ref 1.7–7.7)
Neutrophils Relative %: 45 %
Platelets: 251 10*3/uL (ref 150–400)
RBC: 3.51 MIL/uL — ABNORMAL LOW (ref 4.22–5.81)
RDW: 14.1 % (ref 11.5–15.5)
WBC: 6.3 10*3/uL (ref 4.0–10.5)
nRBC: 0 % (ref 0.0–0.2)

## 2019-05-29 LAB — PROTIME-INR
INR: 1.2 (ref 0.8–1.2)
Prothrombin Time: 14.8 seconds (ref 11.4–15.2)

## 2019-05-29 IMAGING — US IR IMAGING GUIDED PORT INSERTION
1 series · 2 of 2 positions shown · non-contrast
Comparison: None.

INDICATION: 53-year-old with Hodgkin lymphoma.  Port-A-Cath needed for therapy.

EXAM:
FLUOROSCOPIC AND ULTRASOUND GUIDED PLACEMENT OF A SUBCUTANEOUS PORT

[Series 1: ir (id) (id)/(id)/(id) ir · 2 of 2 slices shown]
[im 1/2]
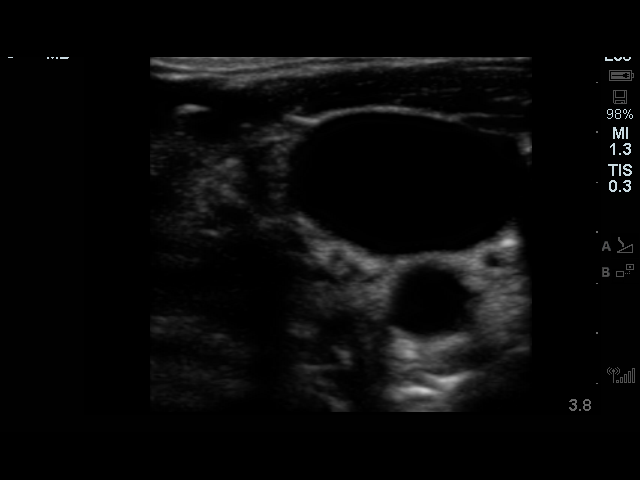
[im 2/2]
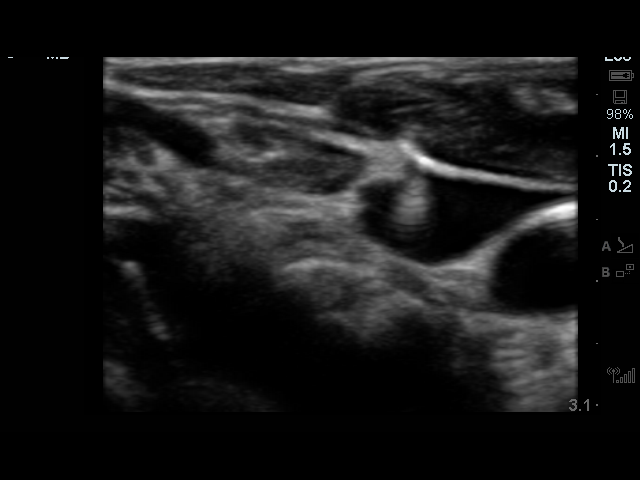

[2 of 2 positions shown; findings below may reference images not displayed]

MEDICATIONS:
Ancef 2 g; The antibiotic was administered within an appropriate
time interval prior to skin puncture.

ANESTHESIA/SEDATION:
Versed 4.0 mg IV; Fentanyl 100 mcg IV;

Moderate Sedation Time:  29 minutes

The patient was continuously monitored during the procedure by the
interventional radiology nurse under my direct supervision.

FLUOROSCOPY TIME:  30 seconds, 3 mGy

COMPLICATIONS:
None immediate.

PROCEDURE:
The procedure, risks, benefits, and alternatives were explained to
the patient. Questions regarding the procedure were encouraged and
answered. The patient understands and consents to the procedure.

Patient was placed supine on the interventional table. Ultrasound
confirmed a patent right internal jugular vein. Ultrasound image
saved for documentation. The right chest and neck were cleaned with
a skin antiseptic and a sterile drape was placed. Maximal barrier
sterile technique was utilized including caps, mask, sterile gowns,
sterile gloves, sterile drape, hand hygiene and skin antiseptic. The
right neck was anesthetized with 1% lidocaine. Small incision was
made in the right neck with a blade. Micropuncture set was placed in
the right internal jugular vein with ultrasound guidance. The
micropuncture wire was used for measurement purposes. The right
chest was anesthetized with 1% lidocaine with epinephrine. #15 blade
was used to make an incision and a subcutaneous port pocket was
formed. 8 french Power Port was assembled. Subcutaneous tunnel was
formed with a stiff tunneling device. The port catheter was brought
through the subcutaneous tunnel. The port was placed in the
subcutaneous pocket. The micropuncture set was exchanged for a
peel-away sheath. The catheter was placed through the peel-away
sheath and the tip was positioned at the SVC and right atrium
junction. Catheter placement was confirmed with fluoroscopy. The
port was accessed and flushed with heparinized saline. The port
pocket was closed using two layers of absorbable sutures and
Dermabond. The vein skin site was closed using a single layer of
absorbable suture and Dermabond. Sterile dressings were applied.
Patient tolerated the procedure well without an immediate
complication. Ultrasound and fluoroscopic images were taken and
saved for this procedure.
IMPRESSION: Placement of a subcutaneous port device. Catheter tip at the SVC and
right atrium junction.

## 2019-05-29 IMAGING — XA IR IMAGING GUIDED PORT INSERTION
1 series · 1 of 1 positions shown · non-contrast
Comparison: None.

INDICATION: 53-year-old with Hodgkin lymphoma.  Port-A-Cath needed for therapy.

EXAM:
FLUOROSCOPIC AND ULTRASOUND GUIDED PLACEMENT OF A SUBCUTANEOUS PORT

[Series 1: care single · 1 of 1 slices shown]
[im 1/1]
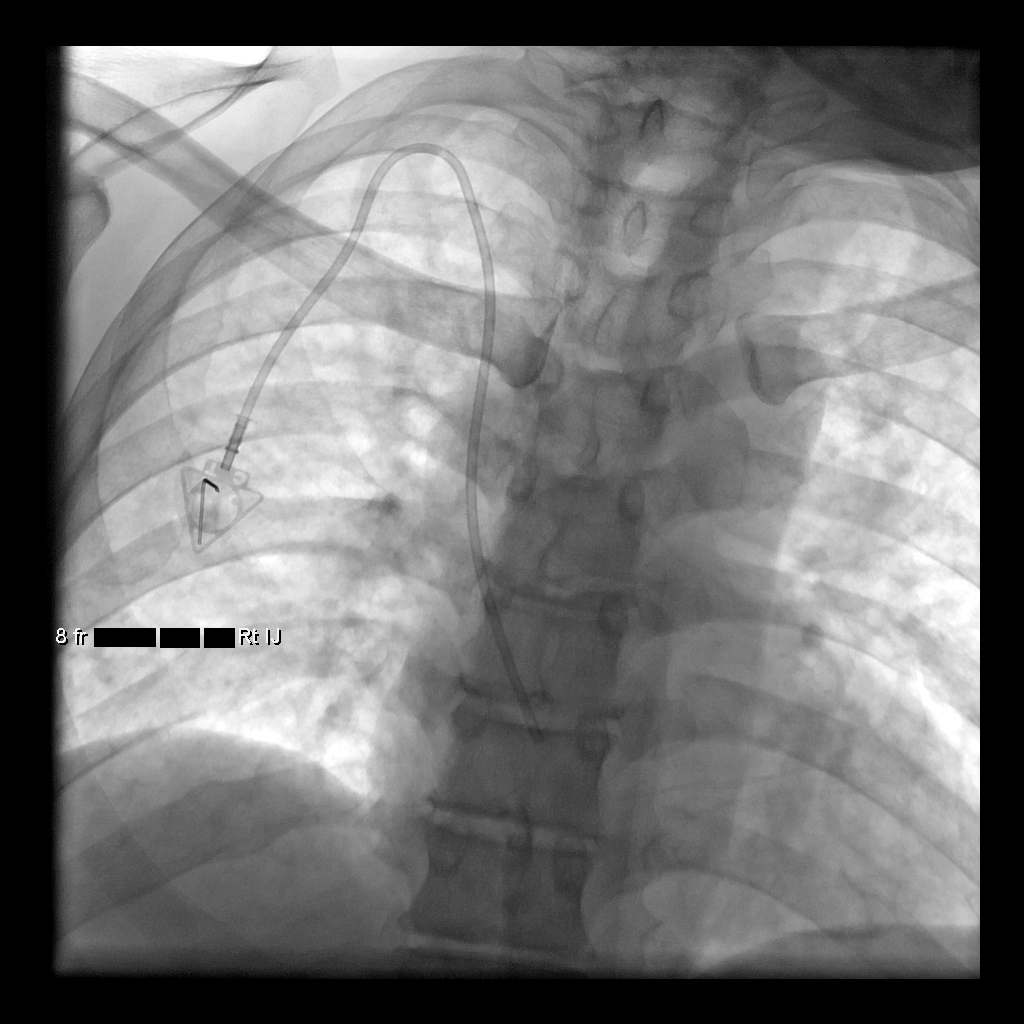

[1 of 1 positions shown; findings below may reference images not displayed]

MEDICATIONS:
Ancef 2 g; The antibiotic was administered within an appropriate
time interval prior to skin puncture.

ANESTHESIA/SEDATION:
Versed 4.0 mg IV; Fentanyl 100 mcg IV;

Moderate Sedation Time:  29 minutes

The patient was continuously monitored during the procedure by the
interventional radiology nurse under my direct supervision.

FLUOROSCOPY TIME:  30 seconds, 3 mGy

COMPLICATIONS:
None immediate.

PROCEDURE:
The procedure, risks, benefits, and alternatives were explained to
the patient. Questions regarding the procedure were encouraged and
answered. The patient understands and consents to the procedure.

Patient was placed supine on the interventional table. Ultrasound
confirmed a patent right internal jugular vein. Ultrasound image
saved for documentation. The right chest and neck were cleaned with
a skin antiseptic and a sterile drape was placed. Maximal barrier
sterile technique was utilized including caps, mask, sterile gowns,
sterile gloves, sterile drape, hand hygiene and skin antiseptic. The
right neck was anesthetized with 1% lidocaine. Small incision was
made in the right neck with a blade. Micropuncture set was placed in
the right internal jugular vein with ultrasound guidance. The
micropuncture wire was used for measurement purposes. The right
chest was anesthetized with 1% lidocaine with epinephrine. #15 blade
was used to make an incision and a subcutaneous port pocket was
formed. 8 french Power Port was assembled. Subcutaneous tunnel was
formed with a stiff tunneling device. The port catheter was brought
through the subcutaneous tunnel. The port was placed in the
subcutaneous pocket. The micropuncture set was exchanged for a
peel-away sheath. The catheter was placed through the peel-away
sheath and the tip was positioned at the SVC and right atrium
junction. Catheter placement was confirmed with fluoroscopy. The
port was accessed and flushed with heparinized saline. The port
pocket was closed using two layers of absorbable sutures and
Dermabond. The vein skin site was closed using a single layer of
absorbable suture and Dermabond. Sterile dressings were applied.
Patient tolerated the procedure well without an immediate
complication. Ultrasound and fluoroscopic images were taken and
saved for this procedure.
IMPRESSION: Placement of a subcutaneous port device. Catheter tip at the SVC and
right atrium junction.

## 2019-05-29 MED ORDER — CEFAZOLIN SODIUM-DEXTROSE 2-4 GM/100ML-% IV SOLN
INTRAVENOUS | Status: AC
  Filled 2019-05-29: qty 100

## 2019-05-29 MED ORDER — LIDOCAINE HCL 1 % IJ SOLN
INTRAMUSCULAR | Status: AC
  Filled 2019-05-29: qty 20

## 2019-05-29 MED ORDER — LIDOCAINE-EPINEPHRINE 1 %-1:100000 IJ SOLN
INTRAMUSCULAR | Status: AC
  Filled 2019-05-29: qty 1

## 2019-05-29 MED ORDER — SODIUM CHLORIDE 0.9 % IV SOLN
INTRAVENOUS | Status: DC
  Administered 2019-05-29: 10:00:00 via INTRAVENOUS

## 2019-05-29 MED ORDER — MIDAZOLAM HCL 2 MG/2ML IJ SOLN
INTRAMUSCULAR | Status: AC | PRN
  Administered 2019-05-29 (×3): 1 mg via INTRAVENOUS

## 2019-05-29 MED ORDER — HEPARIN SOD (PORK) LOCK FLUSH 100 UNIT/ML IV SOLN
INTRAVENOUS | Status: AC
  Filled 2019-05-29: qty 5

## 2019-05-29 MED ORDER — MIDAZOLAM HCL 2 MG/2ML IJ SOLN
INTRAMUSCULAR | Status: AC
  Filled 2019-05-29: qty 6

## 2019-05-29 MED ORDER — CEFAZOLIN SODIUM-DEXTROSE 2-4 GM/100ML-% IV SOLN: 2.0000 g | INTRAVENOUS | Status: DC

## 2019-05-29 MED ORDER — LIDOCAINE-EPINEPHRINE (PF) 1 %-1:200000 IJ SOLN
INTRAMUSCULAR | Status: AC | PRN
  Administered 2019-05-29: 10 mL

## 2019-05-29 MED ORDER — FENTANYL CITRATE (PF) 100 MCG/2ML IJ SOLN
INTRAMUSCULAR | Status: AC
  Filled 2019-05-29: qty 4

## 2019-05-29 MED ORDER — LIDOCAINE HCL (PF) 1 % IJ SOLN
INTRAMUSCULAR | Status: AC | PRN
  Administered 2019-05-29: 5 mL

## 2019-05-29 MED ORDER — FENTANYL CITRATE (PF) 100 MCG/2ML IJ SOLN
INTRAMUSCULAR | Status: AC | PRN
  Administered 2019-05-29 (×2): 50 ug via INTRAVENOUS

## 2019-05-29 MED ORDER — HEPARIN SOD (PORK) LOCK FLUSH 100 UNIT/ML IV SOLN
INTRAVENOUS | Status: AC | PRN
  Administered 2019-05-29: 500 [IU] via INTRAVENOUS

## 2019-05-29 NOTE — Procedures (Signed)
Interventional Radiology Procedure:   Indications: Nodular sclerosis Hodgkin lymphoma of lymph nodes of multiple regions [facility]),   Procedure: Port placement  Findings: Right jugular port, tip at SVC/RA junction  Complications: None     EBL: Minimal, less than 10 ml  Plan: Discharge in one hour.  Keep port site and incisions dry for at least 24 hours.     Ryeleigh Santore R. Anselm Pancoast, MD  Pager: [phone]

## 2019-05-29 NOTE — Discharge Instructions (Signed)
Do not use EMLA cream on your new port until it has healed. The petroleum in the EMLA cream will dissolve the skin glue causing the incision to come apart. You will get a bad infection. Wait until the [place] tell you your port has healed.   Call Interventional Radiology for any problems concerning your port. [phone] or [phone]    Implanted Port Insertion, Care After This sheet gives you information about how to care for yourself after your procedure. Your health care provider may also give you more specific instructions. If you have problems or questions, contact your health care provider. What can I expect after the procedure? After the procedure, it is common to have:  Discomfort at the port insertion site.  Bruising on the skin over the port. This should improve over 3-4 days. Follow these instructions at home: [facility] care  After your port is placed, you will get a manufacturer's information card. The card has information about your port. Keep this card with you at all times.  Take care of the port as told by your health care provider. Ask your health care provider if you or a family member can get training for taking care of the port at home. A home health care nurse may also take care of the port.  Make sure to remember what type of port you have. Incision care      Follow instructions from your health care provider about how to take care of your port insertion site. Make sure you: ? Wash your hands with soap and water before and after you change your bandage (dressing). If soap and water are not available, use hand sanitizer. ? Change your dressing as told by your health care provider. ? Leave stitches (sutures), skin glue, or adhesive strips in place. These skin closures may need to stay in place for 2 weeks or longer. If adhesive strip edges start to loosen and curl up, you may trim the loose edges. Do not remove adhesive strips completely unless your  health care provider tells you to do that.  Check your port insertion site every day for signs of infection. Check for: ? Redness, swelling, or pain. ? Fluid or blood. ? Warmth. ? Pus or a bad smell. Activity  Return to your normal activities as told by your health care provider. Ask your health care provider what activities are safe for you.  Do not lift anything that is heavier than 10 lb (4.5 kg), or the limit that you are told, until your health care provider says that it is safe. General instructions  Take over-the-counter and prescription medicines only as told by your health care provider.  Do not take baths, swim, or use a hot tub until your health care provider approves. Ask your health care provider if you may take showers. You may only be allowed to take sponge baths.  Do not drive for 24 hours if you were given a sedative during your procedure.  Wear a medical alert bracelet in case of an emergency. This will tell any health care providers that you have a port.  Keep all follow-up visits as told by your health care provider. This is important. Contact a health care provider if:  You cannot flush your port with saline as directed, or you cannot draw blood from the port.  You have a fever or chills.  You have redness, swelling, or pain around your port insertion site.  You have fluid or blood coming from  your port insertion site.  Your port insertion site feels warm to the touch.  You have pus or a bad smell coming from the port insertion site. Get help right away if:  You have chest pain or shortness of breath.  You have bleeding from your port that you cannot control. Summary  Take care of the port as told by your health care provider. Keep the manufacturer's information card with you at all times.  Change your dressing as told by your health care provider.  Contact a health care provider if you have a fever or chills or if you have redness, swelling, or  pain around your port insertion site.  Keep all follow-up visits as told by your health care provider. This information is not intended to replace advice given to you by your health care provider. Make sure you discuss any questions you have with your health care provider. Document Released: 05/23/2013 Document Revised: 02/28/2018 Document Reviewed: 02/28/2018 Elsevier Patient Education  [place].      Moderate Conscious Sedation, Adult, Care After These instructions provide you with information about caring for yourself after your procedure. Your health care provider may also give you more specific instructions. Your treatment has been planned according to current medical practices, but problems sometimes occur. Call your health care provider if you have any problems or questions after your procedure. What can I expect after the procedure? After your procedure, it is common:  To feel sleepy for several hours.  To feel clumsy and have poor balance for several hours.  To have poor judgment for several hours.  To vomit if you eat too soon. Follow these instructions at home: For at least 24 hours after the procedure:   Do not: ? Participate in activities where you could fall or become injured. ? Drive. ? Use heavy machinery. ? Drink alcohol. ? Take sleeping pills or medicines that cause drowsiness. ? Make important decisions or sign legal documents. ? Take care of children on your own.  Rest. Eating and drinking  Follow the diet recommended by your health care provider.  If you vomit: ? Drink water, juice, or soup when you can drink without vomiting. ? Make sure you have little or no nausea before eating solid foods. General instructions  Have a responsible adult stay with you until you are awake and alert.  Take over-the-counter and prescription medicines only as told by your health care provider.  If you smoke, do not smoke without supervision.  Keep all  follow-up visits as told by your health care provider. This is important. Contact a health care provider if:  You keep feeling nauseous or you keep vomiting.  You feel light-headed.  You develop a rash.  You have a fever. Get help right away if:  You have trouble breathing. This information is not intended to replace advice given to you by your health care provider. Make sure you discuss any questions you have with your health care provider. Document Released: 05/23/2013 Document Revised: 07/15/2017 Document Reviewed: 11/22/2015 Elsevier Patient Education  2020 [employer].

## 2019-05-29 NOTE — H&P (Signed)
Chief Complaint: Patient was seen in consultation today for port placement.  Referring Physician(s): Heilingoetter,Cassandra L  Supervising Physician: Markus Daft  Patient Status: [facility] - Out-pt  History of Present Illness: Jesse [name] is a 53 y.o. male with a past medical history significant for ETOH abuse, marijuana abuse and recently diagnosed Hodgkin's lymphoma followed by Dr. Julien Nordmann who presents today for a port placement to begin chemotherapy. [name] noted swelling on the left side of his neck several months ago and he presented to [facility] ED on 04/18/19 for evaluation of this swelling after the swelling acutely worsened. He underwent a CT neck w/contrast which showed bulky lymphadenopathy throughout the left neck and left axilla compatible with neoplasm, as well as small lymph nodes in the right neck. He then underwent a follow up CT chest/abdomen/pelvis with contrast later that day which showed left supraclavicular lymphadenopathy worrisome for lymphoproliferative disease without other lymphadenopathy in the chest, abdomen or pelvis. He was discharged later that evening with planned follow up with oncology. He was seen by Dr. Julien Nordmann on 9/8 and decision was made to proceed with excisional biopsy and PET scan to further direct treatment. He underwent excisional biopsy of the left cervical lymph nodes with Dr. Redmond Baseman on 9/21 which pathology showed was consistent with classical Hodgkin's lymphoma, nodular sclerosing subtype. PET scan was performed 10/5 which showed intensely hypermetabolic nodal mass in the left neck, minimally enlarged hypermetabolic nodal disease in the porta hepatis and along the aorta, normal volume spleen with several foci of metabolic activity consistent with lymphoma. Patient is planned to begin chemotherapy and IR has been requested to place a port.  Patient reports severe back pain which started "right after they showed me the pictures of where my cancer moved  to" at his last oncology appointment, he states he has been taking ibuprofen and tylenol with little relief. He reports fevers and sweating at night occasionally, none during the day. He also reports fatigue and poor appetite. He states understanding of procedure and wishes to proceed.    Past Medical History:  Diagnosis Date   ETOH abuse    Lymphadenopathy    left   Marijuana abuse     Past Surgical History:  Procedure Laterality Date   HERNIA REPAIR     as child   LYMPH NODE BIOPSY Left 05/07/2019   Procedure: LYMPH NODE BIOPSY;  Surgeon: Melida Quitter, MD;  Location: [facility];  Service: ENT;  Laterality: Left;    Allergies: Patient has no known allergies.  Medications: Prior to Admission medications   Medication Sig Start Date End Date Taking? Authorizing Provider  acetaminophen (TYLENOL) 500 MG tablet Take 1,000 mg by mouth every 6 (six) hours as needed.   Yes [provider]  ibuprofen (ADVIL) 800 MG tablet Take 800 mg by mouth every 8 (eight) hours as needed.   Yes [provider]  allopurinol (ZYLOPRIM) 100 MG tablet Take 1 tablet (100 mg total) by mouth 2 (two) times daily. 05/24/19   Heilingoetter, Cassandra L, PA-C  lidocaine-prilocaine (EMLA) cream Apply 1 application topically as needed. 05/25/19   Heilingoetter, Cassandra L, PA-C  oxyCODONE-acetaminophen (PERCOCET/ROXICET) 5-325 MG tablet Take 1 tablet by mouth every 8 (eight) hours as needed for severe pain. 05/24/19   Heilingoetter, Cassandra L, PA-C  prochlorperazine (COMPAZINE) 10 MG tablet Take 1 tablet (10 mg total) by mouth every 8 (eight) hours as needed for nausea or vomiting. 05/24/19   Heilingoetter, Cassandra L, PA-C  Family History  Problem Relation Age of Onset   Cancer Mother    Cancer Father    Cancer Sister     Social History   Socioeconomic History   Marital status: Married    Spouse name: Not on file   Number of children: Not on file   Years of  education: Not on file   Highest education level: Not on file  Occupational History   Not on file  Social Needs   Financial resource strain: Not on file   Food insecurity    Worry: Not on file    Inability: Not on file   Transportation needs    Medical: Not on file    Non-medical: Not on file  Tobacco Use   Smoking status: Former Smoker    Packs/day: 1.00    Years: 20.00    Pack years: 20.00    Quit date: 04/24/1999    Years since quitting: 20.1   Smokeless tobacco: Never Used  Substance and Sexual Activity   Alcohol use: Yes    Comment: 12 beers daily   Drug use: Yes    Types: Marijuana    Comment: daily   Sexual activity: Yes  Lifestyle   Physical activity    Days per week: Not on file    Minutes per session: Not on file   Stress: Not on file  Relationships   Social connections    Talks on phone: Not on file    Gets together: Not on file    Attends religious service: Not on file    Active member of club or organization: Not on file    Attends meetings of clubs or organizations: Not on file    Relationship status: Not on file  Other Topics Concern   Not on file  Social History Narrative   Not on file     Review of Systems: A 12 point ROS discussed and pertinent positives are indicated in the HPI above.  All other systems are negative.  Review of Systems  Constitutional: Positive for appetite change, chills (at night), diaphoresis (at night), fatigue and fever (at night).  HENT: Negative for nosebleeds.   Respiratory: Negative for cough and shortness of breath.   Cardiovascular: Negative for chest pain.  Gastrointestinal: Negative for abdominal pain, blood in stool, diarrhea, nausea and vomiting.  Genitourinary: Negative for dysuria and hematuria.  Musculoskeletal: Positive for back pain.  Skin: Negative for rash.  Neurological: Negative for dizziness and headaches.  Hematological: Positive for adenopathy.    Vital Signs: There were no  vitals taken for this visit.  Physical Exam Vitals signs reviewed.  Constitutional:      Comments: Appears to be in pain, laying flat on bed.   HENT:     Head: Normocephalic.     Mouth/Throat:     Mouth: Mucous membranes are moist.     Pharynx: Oropharynx is clear. No oropharyngeal exudate or posterior oropharyngeal erythema.  Cardiovascular:     Rate and Rhythm: Normal rate and regular rhythm.  Pulmonary:     Effort: Pulmonary effort is normal.     Breath sounds: Normal breath sounds.  Abdominal:     General: There is no distension.     Palpations: Abdomen is soft.     Tenderness: There is no abdominal tenderness.  Lymphadenopathy:     Cervical: Cervical adenopathy (Left) present.  Skin:    General: Skin is warm and dry.  Neurological:     Mental Status: He  is alert and oriented to person, place, and time.  Psychiatric:        Mood and Affect: Mood normal.        Behavior: Behavior normal.        Thought Content: Thought content normal.        Judgment: Judgment normal.      MD Evaluation Airway: WNL Heart: WNL Abdomen: WNL Chest/ Lungs: WNL ASA  Classification: 3 Mallampati/Airway Score: Two   Imaging: Nm Pet Image Initial (pi) Skull Base To Thigh  Result Date: 05/21/2019 CLINICAL DATA:  Initial treatment strategy for Hodgkin's lymphoma. EXAM: NUCLEAR MEDICINE PET SKULL BASE TO THIGH TECHNIQUE: 7.5 mCi F-18 FDG was injected intravenously. Full-ring PET imaging was performed from the skull base to thigh after the radiotracer. CT data was obtained and used for attenuation correction and anatomic localization. Fasting blood glucose: 85 mg/dl COMPARISON:  CT 04/18/2019 FINDINGS: Mediastinal blood pool activity: SUV max 2.5 Liver activity: SUV max 2.3 NECK: Bulky hypermetabolic nodal mass extending from the LEFT level II nodal station to involve the level III nodal stations, level V nodal stations and LEFT supraclavicular nodal stations. At the level of LEFT level 2 nodes,  the nodes measure up to 3 cm short axis with SUV max equal 19.0. The level of the the LEFT supraclavicular nodes, nodes measure up to 2.8 cm SUV max equal 23.6. A single small hypermetabolic RIGHT level 2 lymph node with SUV max equal 3.5. Incidental CT findings: None CHEST: No hypermetabolic mediastinal lymph nodes. No suspicious pulmonary nodules. Incidental CT findings: none ABDOMEN/PELVIS: There is several discrete foci of hypermetabolic activity within the spleen which are poorly defined on the noncontrast CT. Lesion in the medial spleen with SUV max equal 7.8. Second lesion in the posterior spleen with SUV max equal 7.8. Spleen is normal volume. No focal activity within the liver. There are hypermetabolic periportal lymph nodes with SUV max equal 7.9. Small hypermetabolic upper periaortic lymph nodes present. No hypermetabolic iliac or inguinal nodes. Incidental CT findings: No splenomegaly SKELETON: There are multiple intense foci of radiotracer activity throughout the spine, ribs, pelvis. For example lesion at the L4 vertebral body level with SUV max equal 17. Lesion in the LEFT sacrum with SUV max equal 9.5. They are approximately 50 lesions within the skeleton. These lesions are occult by CT imaging. Incidental CT findings: No fracture. IMPRESSION: 1. Intensely hypermetabolic nodal mass in the LEFT neck consistent with lymphoma. 2. Minimally enlarged hypermetabolic nodal disease in the porta hepatis and along the aorta. 3. While the spleen is normal volume, several foci of metabolic activity in the spleen consistent with lymphoma. 4. Extensive intensely hypermetabolic metastasis within the marrow space of the pelvis, spine and ribs. Lesions are CT occult. Electronically Signed   By: Suzy Bouchard M.D.   On: 05/21/2019 15:51    Labs:  CBC: Recent Labs    04/24/19 1351 05/10/19 1226 05/17/19 0836 05/29/19 0954  WBC 4.7 4.3 5.6 6.3  HGB 11.9* 12.1* 12.8* 10.2*  HCT 35.6* 36.7* 39.4 32.4*    PLT 276 316 PLATELET CLUMPS NOTED ON SMEAR, COUNT APPEARS ADEQUATE 251    COAGS: No results for input(s): INR, APTT in the last 8760 hours.  BMP: Recent Labs    04/18/19 1612 04/24/19 1351 05/10/19 1226  NA 133* 134* 137  K 3.4* 3.6 3.7  CL 97* 99 101  CO2 24 26 28   GLUCOSE 97 76 112*  BUN 11 11 13   CALCIUM 8.9 8.6* 8.6*  CREATININE 0.85 0.91 0.77  GFRNONAA >60 >60 >60  GFRAA >60 >60 >60    LIVER FUNCTION TESTS: Recent Labs    04/18/19 1612 04/24/19 1351 05/10/19 1226  BILITOT 1.0 0.5 0.4  AST 20 17 14*  ALT 18 15 15   ALKPHOS 118 115 139*  PROT 7.6 7.5 7.4  ALBUMIN 3.6 3.3* 3.2*    TUMOR MARKERS: No results for input(s): AFPTM, CEA, CA199, CHROMGRNA in the last 8760 hours.  Assessment and Plan:  53 y/o M with recently diagnosed Hodgkin's lymphoma followed by Dr. Julien Nordmann who presents today for port placement to begin chemotherapy.  Patient has been NPO since midnight except for a small sip of water this morning with his ibuprofen, he does not take blood thinning medications. Afebrile, WBC 6.3, hgb 10.2, plt 251, INR pending at time of this note writing.  Risks and benefits of image guided port-a-catheter placement were discussed with the patient including, but not limited to bleeding, infection, pneumothorax, or fibrin sheath development and need for additional procedures.  All of the patient's questions were answered, patient is agreeable to proceed.  Consent signed and in chart.  Thank you for this interesting consult.  I greatly enjoyed meeting Rosalio Charlet and look forward to participating in their care.  A copy of this report was sent to the requesting provider on this date.  Electronically Signed: Joaquim Nam, PA-C 05/29/2019, 10:17 AM   I spent a total of 15 Minutes   in face to face in clinical consultation, greater than 50% of which was counseling/coordinating care for port placement.

## 2019-05-30 ENCOUNTER — Other Ambulatory Visit: Payer: Self-pay

## 2019-05-30 ENCOUNTER — Inpatient Hospital Stay: Payer: Medicaid Other

## 2019-05-30 ENCOUNTER — Other Ambulatory Visit: Payer: Self-pay | Admitting: Physician Assistant

## 2019-05-30 DIAGNOSIS — G893 Neoplasm related pain (acute) (chronic): Secondary | ICD-10-CM

## 2019-05-30 MED ORDER — OXYCODONE-ACETAMINOPHEN 5-325 MG PO TABS: 1.0000 | ORAL_TABLET | Freq: Three times a day (TID) | ORAL | 0 refills | Status: DC | PRN

## 2019-05-30 MED FILL — OXYCODONE-ACETAMINOPHEN 5-3: 5-325 | 15 days supply | Qty: 45 | Fill #0

## 2019-05-31 ENCOUNTER — Inpatient Hospital Stay: Payer: Medicaid Other

## 2019-05-31 ENCOUNTER — Other Ambulatory Visit: Payer: Self-pay

## 2019-05-31 VITALS — BP 125/87 | HR 75 | Temp 98.7°F | Resp 18 | Wt 138.5 lb

## 2019-05-31 DIAGNOSIS — C8118 Nodular sclerosis classical Hodgkin lymphoma, lymph nodes of multiple sites: Secondary | ICD-10-CM

## 2019-05-31 DIAGNOSIS — Z5112 Encounter for antineoplastic immunotherapy: Secondary | ICD-10-CM | POA: Diagnosis not present

## 2019-05-31 LAB — CMP (CANCER CENTER ONLY)
ALT: 30 U/L (ref 0–44)
AST: 30 U/L (ref 15–41)
Albumin: 2.5 g/dL — ABNORMAL LOW (ref 3.5–5.0)
Alkaline Phosphatase: 147 U/L — ABNORMAL HIGH (ref 38–126)
Anion gap: 10 (ref 5–15)
BUN: 15 mg/dL (ref 6–20)
CO2: 28 mmol/L (ref 22–32)
Calcium: 8.5 mg/dL — ABNORMAL LOW (ref 8.9–10.3)
Chloride: 102 mmol/L (ref 98–111)
Creatinine: 0.8 mg/dL (ref 0.61–1.24)
GFR, Est AFR Am: >60 mL/min (ref >60)
GFR, Estimated: >60 mL/min (ref >60)
Glucose, Bld: 141 mg/dL — ABNORMAL HIGH (ref 70–99)
Potassium: 3.9 mmol/L (ref 3.5–5.1)
Sodium: 140 mmol/L (ref 135–145)
Total Bilirubin: 0.5 mg/dL (ref 0.3–1.2)
Total Protein: 7.6 g/dL (ref 6.5–8.1)

## 2019-05-31 LAB — LACTATE DEHYDROGENASE: LDH: 398 U/L — ABNORMAL HIGH (ref 98–192)

## 2019-05-31 LAB — CBC WITH DIFFERENTIAL (CANCER CENTER ONLY)
Abs Immature Granulocytes: 0.07 10*3/uL (ref 0.00–0.07)
Basophils Absolute: 0 10*3/uL (ref 0.0–0.1)
Basophils Relative: 0 %
Eosinophils Absolute: 0 10*3/uL (ref 0.0–0.5)
Eosinophils Relative: 0 %
HCT: 31.1 % — ABNORMAL LOW (ref 39.0–52.0)
Hemoglobin: 9.9 g/dL — ABNORMAL LOW (ref 13.0–17.0)
Immature Granulocytes: 1 %
Lymphocytes Relative: 26 %
Lymphs Abs: 1.4 10*3/uL (ref 0.7–4.0)
MCH: 29 pg (ref 26.0–34.0)
MCHC: 31.8 g/dL (ref 30.0–36.0)
MCV: 91.2 fL (ref 80.0–100.0)
Monocytes Absolute: 1.3 10*3/uL — ABNORMAL HIGH (ref 0.1–1.0)
Monocytes Relative: 23 %
Neutro Abs: 2.7 10*3/uL (ref 1.7–7.7)
Neutrophils Relative %: 50 %
Platelet Count: 249 10*3/uL (ref 150–400)
RBC: 3.41 MIL/uL — ABNORMAL LOW (ref 4.22–5.81)
RDW: 14.3 % (ref 11.5–15.5)
WBC Count: 5.5 10*3/uL (ref 4.0–10.5)
nRBC: 0 % (ref 0.0–0.2)

## 2019-05-31 MED ORDER — DIPHENHYDRAMINE HCL 50 MG/ML IJ SOLN
50.0000 mg | Freq: Once | INTRAMUSCULAR | Status: AC
  Administered 2019-05-31: 50 mg via INTRAVENOUS

## 2019-05-31 MED ORDER — SODIUM CHLORIDE 0.9 % IV SOLN
Freq: Once | INTRAVENOUS | Status: AC
  Administered 2019-05-31: 11:00:00 via INTRAVENOUS
  Filled 2019-05-31: qty 250

## 2019-05-31 MED ORDER — SODIUM CHLORIDE 0.9 % IV SOLN
375.0000 mg/m2 | Freq: Once | INTRAVENOUS | Status: AC
  Administered 2019-05-31: 650 mg via INTRAVENOUS
  Filled 2019-05-31: qty 65

## 2019-05-31 MED ORDER — PALONOSETRON HCL INJECTION 0.25 MG/5ML
INTRAVENOUS | Status: AC
  Filled 2019-05-31: qty 5

## 2019-05-31 MED ORDER — SODIUM CHLORIDE 0.9 % IV SOLN
Freq: Once | INTRAVENOUS | Status: AC
  Administered 2019-05-31: 11:00:00 via INTRAVENOUS
  Filled 2019-05-31: qty 5

## 2019-05-31 MED ORDER — ACETAMINOPHEN 325 MG PO TABS
ORAL_TABLET | ORAL | Status: AC
  Filled 2019-05-31: qty 2

## 2019-05-31 MED ORDER — DOXORUBICIN HCL CHEMO IV INJECTION 2 MG/ML
25.0000 mg/m2 | Freq: Once | INTRAVENOUS | Status: AC
  Administered 2019-05-31: 44 mg via INTRAVENOUS
  Filled 2019-05-31: qty 22

## 2019-05-31 MED ORDER — DIPHENHYDRAMINE HCL 50 MG/ML IJ SOLN
INTRAMUSCULAR | Status: AC
  Filled 2019-05-31: qty 1

## 2019-05-31 MED ORDER — SODIUM CHLORIDE 0.9% FLUSH
10.0000 mL | INTRAVENOUS | Status: DC | PRN
  Administered 2019-05-31: 10 mL
  Filled 2019-05-31: qty 10

## 2019-05-31 MED ORDER — HEPARIN SOD (PORK) LOCK FLUSH 100 UNIT/ML IV SOLN
500.0000 [IU] | Freq: Once | INTRAVENOUS | Status: AC | PRN
  Administered 2019-05-31: 500 [IU]
  Filled 2019-05-31: qty 5

## 2019-05-31 MED ORDER — PALONOSETRON HCL INJECTION 0.25 MG/5ML
0.2500 mg | Freq: Once | INTRAVENOUS | Status: AC
  Administered 2019-05-31: 0.25 mg via INTRAVENOUS

## 2019-05-31 MED ORDER — SODIUM CHLORIDE 0.9 % IV SOLN
1.2000 mg/kg | Freq: Once | INTRAVENOUS | Status: AC
  Administered 2019-05-31: 75 mg via INTRAVENOUS
  Filled 2019-05-31: qty 15

## 2019-05-31 MED ORDER — ACETAMINOPHEN 325 MG PO TABS
650.0000 mg | ORAL_TABLET | Freq: Once | ORAL | Status: AC
  Administered 2019-05-31: 650 mg via ORAL

## 2019-05-31 MED ORDER — VINBLASTINE SULFATE CHEMO INJECTION 1 MG/ML
5.8000 mg/m2 | Freq: Once | INTRAVENOUS | Status: AC
  Administered 2019-05-31: 10 mg via INTRAVENOUS
  Filled 2019-05-31: qty 10

## 2019-05-31 NOTE — Patient Instructions (Signed)

## 2019-05-31 NOTE — Patient Instructions (Signed)
[facility] Discharge Instructions for Patients Receiving Chemotherapy  Today you received the following chemotherapy agents Doxorubicin (ADRIAMYCIN), Vinblastine (VELBAN), Dacarbazine (DTIC) & Brentuximab (ADECETRIS).  To help prevent nausea and vomiting after your treatment, we encourage you to take your nausea medication as prescribed.  If you develop nausea and vomiting that is not controlled by your nausea medication, call the clinic.   BELOW ARE SYMPTOMS THAT SHOULD BE REPORTED IMMEDIATELY:  *FEVER GREATER THAN 100.5 F  *CHILLS WITH OR WITHOUT FEVER  NAUSEA AND VOMITING THAT IS NOT CONTROLLED WITH YOUR NAUSEA MEDICATION  *UNUSUAL SHORTNESS OF BREATH  *UNUSUAL BRUISING OR BLEEDING  TENDERNESS IN MOUTH AND THROAT WITH OR WITHOUT PRESENCE OF ULCERS  *URINARY PROBLEMS  *BOWEL PROBLEMS  UNUSUAL RASH Items with * indicate a potential emergency and should be followed up as soon as possible.  Feel free to call the clinic should you have any questions or concerns. The clinic phone number is [phone].  Please show the [facility] at check-in to the Emergency Department and triage nurse.  Doxorubicin injection What is this medicine? DOXORUBICIN (dox oh ROO bi sin) is a chemotherapy drug. It is used to treat many kinds of cancer like leukemia, lymphoma, neuroblastoma, sarcoma, and Wilms' tumor. It is also used to treat bladder cancer, breast cancer, lung cancer, ovarian cancer, stomach cancer, and thyroid cancer. This medicine may be used for other purposes; ask your health care provider or pharmacist if you have questions. COMMON BRAND NAME(S): Adriamycin, Adriamycin PFS, Adriamycin RDF, Rubex What should I tell my health care provider before I take this medicine? They need to know if you have any of these conditions:  heart disease  history of low blood counts caused by a medicine  liver disease  recent or ongoing radiation therapy  an unusual or  allergic reaction to doxorubicin, other chemotherapy agents, other medicines, foods, dyes, or preservatives  pregnant or trying to get pregnant  breast-feeding How should I use this medicine? This drug is given as an infusion into a vein. It is administered in a hospital or clinic by a specially trained health care professional. If you have pain, swelling, burning or any unusual feeling around the site of your injection, tell your health care professional right away. Talk to your pediatrician regarding the use of this medicine in children. Special care may be needed. Overdosage: If you think you have taken too much of this medicine contact a poison control center or emergency room at once. NOTE: This medicine is only for you. Do not share this medicine with others. What if I miss a dose? It is important not to miss your dose. Call your doctor or health care professional if you are unable to keep an appointment. What may interact with this medicine? This medicine may interact with the following medications:  6-mercaptopurine  paclitaxel  phenytoin  St. John's Wort  trastuzumab  verapamil This list may not describe all possible interactions. Give your health care provider a list of all the medicines, herbs, non-prescription drugs, or dietary supplements you use. Also tell them if you smoke, drink alcohol, or use illegal drugs. Some items may interact with your medicine. What should I watch for while using this medicine? This drug may make you feel generally unwell. This is not uncommon, as chemotherapy can affect healthy cells as well as cancer cells. Report any side effects. Continue your course of treatment even though you feel ill unless your doctor tells you to stop.  There is a maximum amount of this medicine you should receive throughout your life. The amount depends on the medical condition being treated and your overall health. Your doctor will watch how much of this medicine you  receive in your lifetime. Tell your doctor if you have taken this medicine before. You may need blood work done while you are taking this medicine. Your urine may turn red for a few days after your dose. This is not blood. If your urine is dark or brown, call your doctor. In some cases, you may be given additional medicines to help with side effects. Follow all directions for their use. Call your doctor or health care professional for advice if you get a fever, chills or sore throat, or other symptoms of a cold or flu. Do not treat yourself. This drug decreases your body's ability to fight infections. Try to avoid being around people who are sick. This medicine may increase your risk to bruise or bleed. Call your doctor or health care professional if you notice any unusual bleeding. Talk to your doctor about your risk of cancer. You may be more at risk for certain types of cancers if you take this medicine. Do not become pregnant while taking this medicine or for 6 months after stopping it. Women should inform their doctor if they wish to become pregnant or think they might be pregnant. Men should not father a child while taking this medicine and for 6 months after stopping it. There is a potential for serious side effects to an unborn child. Talk to your health care professional or pharmacist for more information. Do not breast-feed an infant while taking this medicine. This medicine has caused ovarian failure in some women and reduced sperm counts in some men This medicine may interfere with the ability to have a child. Talk with your doctor or health care professional if you are concerned about your fertility. This medicine may cause a decrease in Co-Enzyme Q-10. You should make sure that you get enough Co-Enzyme Q-10 while you are taking this medicine. Discuss the foods you eat and the vitamins you take with your health care professional. What side effects may I notice from receiving this  medicine? Side effects that you should report to your doctor or health care professional as soon as possible:  allergic reactions like skin rash, itching or hives, swelling of the face, lips, or tongue  breathing problems  chest pain  fast or irregular heartbeat  low blood counts - this medicine may decrease the number of white blood cells, red blood cells and platelets. You may be at increased risk for infections and bleeding.  pain, redness, or irritation at site where injected  signs of infection - fever or chills, cough, sore throat, pain or difficulty passing urine  signs of decreased platelets or bleeding - bruising, pinpoint red spots on the skin, black, tarry stools, blood in the urine  swelling of the ankles, feet, hands  tiredness  weakness Side effects that usually do not require medical attention (report to your doctor or health care professional if they continue or are bothersome):  diarrhea  hair loss  mouth sores  nail discoloration or damage  nausea  red colored urine  vomiting This list may not describe all possible side effects. Call your doctor for medical advice about side effects. You may report side effects to FDA at 1-800-FDA-1088. Where should I keep my medicine? This drug is given in a hospital or clinic and will  not be stored at home. NOTE: This sheet is a summary. It may not cover all possible information. If you have questions about this medicine, talk to your doctor, pharmacist, or health care provider.  2020 Elsevier/Gold Standard (2017-03-16 11:01:26)  Vinblastine injection What is this medicine? VINBLASTINE (vin BLAS teen) is a chemotherapy drug. It slows the growth of cancer cells. This medicine is used to treat many types of cancer like breast cancer, testicular cancer, Hodgkin's disease, non-Hodgkin's lymphoma, and sarcoma. This medicine may be used for other purposes; ask your health care provider or pharmacist if you have  questions. COMMON BRAND NAME(S): Velban What should I tell my health care provider before I take this medicine? They need to know if you have any of these conditions:  blood disorders  dental disease  gout  infection (especially a virus infection such as chickenpox, cold sores, or herpes)  liver disease  lung disease  nervous system disease  recent or ongoing radiation therapy  an unusual or allergic reaction to vinblastine, other chemotherapy agents, other medicines, foods, dyes, or preservatives  pregnant or trying to get pregnant  breast-feeding How should I use this medicine? This drug is given as an infusion into a vein. It is administered in a hospital or clinic by a specially trained health care professional. If you have pain, swelling, burning or any unusual feeling around the site of your injection, tell your health care professional right away. Talk to your pediatrician regarding the use of this medicine in children. While this drug may be prescribed for selected conditions, precautions do apply. Overdosage: If you think you have taken too much of this medicine contact a poison control center or emergency room at once. NOTE: This medicine is only for you. Do not share this medicine with others. What if I miss a dose? It is important not to miss your dose. Call your doctor or health care professional if you are unable to keep an appointment. What may interact with this medicine? Do not take this medicine with any of the following medications:  erythromycin  itraconazole  mibefradil  voriconazole This medicine may also interact with the following medications:  cyclosporine  fluconazole  ketoconazole  medicines for seizures like phenytoin  medicines to increase blood counts like filgrastim, pegfilgrastim, sargramostim  vaccines  verapamil Talk to your doctor or health care professional before taking any of these  medicines:  acetaminophen  aspirin  ibuprofen  ketoprofen  naproxen This list may not describe all possible interactions. Give your health care provider a list of all the medicines, herbs, non-prescription drugs, or dietary supplements you use. Also tell them if you smoke, drink alcohol, or use illegal drugs. Some items may interact with your medicine. What should I watch for while using this medicine? Your condition will be monitored carefully while you are receiving this medicine. You will need important blood work done while you are taking this medicine. This drug may make you feel generally unwell. This is not uncommon, as chemotherapy can affect healthy cells as well as cancer cells. Report any side effects. Continue your course of treatment even though you feel ill unless your doctor tells you to stop. In some cases, you may be given additional medicines to help with side effects. Follow all directions for their use. Call your doctor or health care professional for advice if you get a fever, chills or sore throat, or other symptoms of a cold or flu. Do not treat yourself. This drug decreases your  body's ability to fight infections. Try to avoid being around people who are sick. This medicine may increase your risk to bruise or bleed. Call your doctor or health care professional if you notice any unusual bleeding. Be careful brushing and flossing your teeth or using a toothpick because you may get an infection or bleed more easily. If you have any dental work done, tell your dentist you are receiving this medicine. Avoid taking products that contain aspirin, acetaminophen, ibuprofen, naproxen, or ketoprofen unless instructed by your doctor. These medicines may hide a fever. Do not become pregnant while taking this medicine. Women should inform their doctor if they wish to become pregnant or think they might be pregnant. There is a potential for serious side effects to an unborn child. Talk  to your health care professional or pharmacist for more information. Do not breast-feed an infant while taking this medicine. Men may have a lower sperm count while taking this medicine. Talk to your doctor if you plan to father a child. What side effects may I notice from receiving this medicine? Side effects that you should report to your doctor or health care professional as soon as possible:  allergic reactions like skin rash, itching or hives, swelling of the face, lips, or tongue  low blood counts - This drug may decrease the number of white blood cells, red blood cells and platelets. You may be at increased risk for infections and bleeding.  signs of infection - fever or chills, cough, sore throat, pain or difficulty passing urine  signs of decreased platelets or bleeding - bruising, pinpoint red spots on the skin, black, tarry stools, nosebleeds  signs of decreased red blood cells - unusually weak or tired, fainting spells, lightheadedness  breathing problems  changes in hearing  change in the amount of urine  chest pain  high blood pressure  mouth sores  nausea and vomiting  pain, swelling, redness or irritation at the injection site  pain, tingling, numbness in the hands or feet  problems with balance, dizziness  seizures Side effects that usually do not require medical attention (report to your doctor or health care professional if they continue or are bothersome):  constipation  hair loss  jaw pain  loss of appetite  sensitivity to light  stomach pain  tumor pain This list may not describe all possible side effects. Call your doctor for medical advice about side effects. You may report side effects to FDA at 1-800-FDA-1088. Where should I keep my medicine? This drug is given in a hospital or clinic and will not be stored at home. NOTE: This sheet is a summary. It may not cover all possible information. If you have questions about this medicine, talk  to your doctor, pharmacist, or health care provider.  2020 Elsevier/Gold Standard (2008-04-29 17:15:59)  Dacarbazine, DTIC injection What is this medicine? DACARBAZINE (da KAR ba zeen) is a chemotherapy drug. This medicine is used to treat skin cancer. It is also used with other medicines to treat Hodgkin's disease. This medicine may be used for other purposes; ask your health care provider or pharmacist if you have questions. COMMON BRAND NAME(S): DTIC-Dome What should I tell my health care provider before I take this medicine? They need to know if you have any of these conditions:  infection (especially virus infection such as chickenpox, cold sores, or herpes)  kidney disease  liver disease  low blood counts like low platelets, red blood cells, white blood cells  recent radiation therapy  an unusual or allergic reaction to dacarbazine, other chemotherapy agents, other medicines, foods, dyes, or preservatives  pregnant or trying to get pregnant  breast-feeding How should I use this medicine? This drug is given as an injection or infusion into a vein. It is administered in a hospital or clinic by a specially trained health care professional. Talk to your pediatrician regarding the use of this medicine in children. While this drug may be prescribed for selected conditions, precautions do apply. Overdosage: If you think you have taken too much of this medicine contact a poison control center or emergency room at once. NOTE: This medicine is only for you. Do not share this medicine with others. What if I miss a dose? It is important not to miss your dose. Call your doctor or health care professional if you are unable to keep an appointment. What may interact with this medicine?  medicines to increase blood counts like filgrastim, pegfilgrastim, sargramostim  vaccines This list may not describe all possible interactions. Give your health care provider a list of all the medicines,  herbs, non-prescription drugs, or dietary supplements you use. Also tell them if you smoke, drink alcohol, or use illegal drugs. Some items may interact with your medicine. What should I watch for while using this medicine? Your condition will be monitored carefully while you are receiving this medicine. You will need important blood work done while you are taking this medicine. This drug may make you feel generally unwell. This is not uncommon, as chemotherapy can affect healthy cells as well as cancer cells. Report any side effects. Continue your course of treatment even though you feel ill unless your doctor tells you to stop. Call your doctor or health care professional for advice if you get a fever, chills or sore throat, or other symptoms of a cold or flu. Do not treat yourself. This drug decreases your body's ability to fight infections. Try to avoid being around people who are sick. This medicine may increase your risk to bruise or bleed. Call your doctor or health care professional if you notice any unusual bleeding. Talk to your doctor about your risk of cancer. You may be more at risk for certain types of cancers if you take this medicine. Do not become pregnant while taking this medicine. Women should inform their doctor if they wish to become pregnant or think they might be pregnant. There is a potential for serious side effects to an unborn child. Talk to your health care professional or pharmacist for more information. Do not breast-feed an infant while taking this medicine. What side effects may I notice from receiving this medicine? Side effects that you should report to your doctor or health care professional as soon as possible:  allergic reactions like skin rash, itching or hives, swelling of the face, lips, or tongue  low blood counts - this medicine may decrease the number of white blood cells, red blood cells and platelets. You may be at increased risk for infections and  bleeding.  signs of infection - fever or chills, cough, sore throat, pain or difficulty passing urine  signs of decreased platelets or bleeding - bruising, pinpoint red spots on the skin, black, tarry stools, blood in the urine  signs of decreased red blood cells - unusually weak or tired, fainting spells, lightheadedness  breathing problems  muscle pains  pain at site where injected  trouble passing urine or change in the amount of urine  vomiting  yellowing of the eyes  or skin Side effects that usually do not require medical attention (report to your doctor or health care professional if they continue or are bothersome):  diarrhea  hair loss  loss of appetite  nausea  skin more sensitive to sun or ultraviolet light  stomach upset This list may not describe all possible side effects. Call your doctor for medical advice about side effects. You may report side effects to FDA at 1-800-FDA-1088. Where should I keep my medicine? This drug is given in a hospital or clinic and will not be stored at home. NOTE: This sheet is a summary. It may not cover all possible information. If you have questions about this medicine, talk to your doctor, pharmacist, or health care provider.  2020 Elsevier/Gold Standard (2015-10-03 15:17:39)  Brentuximab vedotin solution for injection What is this medicine? BRENTUXIMAB VEDOTIN (bren TUX see mab ve DOE tin) is a monoclonal antibody and a chemotherapy drug. It is used for treating Hodgkin lymphoma and certain non-Hodgkin lymphomas, such as anaplastic large-cell lymphoma, mycosis fungoides, and peripheral T-cell lymphoma. This medicine may be used for other purposes; ask your health care provider or pharmacist if you have questions. COMMON BRAND NAME(S): ADCETRIS What should I tell my health care provider before I take this medicine? They need to know if you have any of these conditions:  immune system problems  infection (especially a virus  infection such as chickenpox, cold sores, or herpes)  kidney disease  liver disease  low blood counts, like low white cell, platelet, or red cell counts  tingling of the fingers or toes, or other nerve disorder  an unusual or allergic reaction to brentuximab vedotin, other medicines, foods, dyes, or preservatives  pregnant or trying to get pregnant  breast-feeding How should I use this medicine? This medicine is for infusion into a vein. It is given by a health care professional in a hospital or clinic setting. Talk to your pediatrician regarding the use of this medicine in children. Special care may be needed. Overdosage: If you think you have taken too much of this medicine contact a poison control center or emergency room at once. NOTE: This medicine is only for you. Do not share this medicine with others. What if I miss a dose? It is important not to miss your dose. Call your doctor or health care professional if you are unable to keep an appointment. What may interact with this medicine? This medicine may interact with the following medications:  ketoconazole  rifampin  St. John's wort; Hypericum perforatum This list may not describe all possible interactions. Give your health care provider a list of all the medicines, herbs, non-prescription drugs, or dietary supplements you use. Also tell them if you smoke, drink alcohol, or use illegal drugs. Some items may interact with your medicine. What should I watch for while using this medicine? Visit your doctor for checks on your progress. This drug may make you feel generally unwell. Report any side effects. Continue your course of treatment even though you feel ill unless your doctor tells you to stop. Call your doctor or health care professional for advice if you get a fever, chills or sore throat, or other symptoms of a cold or flu. Do not treat yourself. This drug decreases your body's ability to fight infections. Try to avoid  being around people who are sick. This medicine may increase your risk to bruise or bleed. Call your doctor or health care professional if you notice any unusual bleeding. In some  patients, this medicine may cause a serious brain infection that may cause death. If you have any problems seeing, thinking, speaking, walking, or standing, tell your doctor right away. If you cannot reach your doctor, urgently seek other source of medical care. Do not become pregnant while taking this medicine or for 6 months after stopping it. Women should inform their doctor if they wish to become pregnant or think they might be pregnant. Men should not father a child while taking this medicine and for 6 months after stopping it. There is a potential for serious side effects to an unborn child. Talk to your health care professional or pharmacist for more information. Do not breast-feed an infant while taking this medicine. This may interfere with the ability to father a child. You should talk to your doctor or health care professional if you are concerned about your fertility. What side effects may I notice from receiving this medicine? Side effects that you should report to your doctor or health care professional as soon as possible:  allergic reactions like skin rash, itching or hives, swelling of the face, lips, or tongue  changes in emotions or moods  diarrhea  low blood counts - this medicine may decrease the number of white blood cells, red blood cells and platelets. You may be at increased risk for infections and bleeding.  pain, tingling, numbness in the hands or feet  redness, blistering, peeling or loosening of the skin, including inside the mouth  shortness of breath  signs of infection - fever or chills, cough, sore throat, pain or difficulty passing urine  signs of decreased platelets or bleeding - bruising, pinpoint red spots on the skin, black, tarry stools, blood in the urine  signs of decreased  red blood cells - unusually weak or tired, fainting spells, lightheadedness  signs of liver injury like dark yellow or brown urine; general ill feeling or flu-like symptoms; light-colored stools; loss of appetite; nausea; right upper belly pain; yellowing of the eyes or skin  stomach pain  sudden numbness or weakness of the face, arm or leg  vomiting Side effects that usually do not require medical attention (report to your doctor or health care professional if they continue or are bothersome):  constipation  dizziness  headache  muscle pain  tiredness This list may not describe all possible side effects. Call your doctor for medical advice about side effects. You may report side effects to FDA at 1-800-FDA-1088. Where should I keep my medicine? This drug is given in a hospital or clinic and will not be stored at home. NOTE: This sheet is a summary. It may not cover all possible information. If you have questions about this medicine, talk to your doctor, pharmacist, or health care provider.  2020 [employer]/Gold Standard (2017-07-04 14:10:02)  Coronavirus (COVID-19) Are you at risk?  Are you at risk for the Coronavirus (COVID-19)?  To be considered HIGH RISK for Coronavirus (COVID-19), you have to meet the following criteria:  . Traveled to [place], [place], [place], [place] or [place]; or in the [place] to [place], [place], [place], or [place]; and have fever, cough, and shortness of breath within the last 2 weeks of travel OR . Been in close contact with a person diagnosed with COVID-19 within the last 2 weeks and have fever, cough, and shortness of breath . IF YOU DO NOT MEET THESE CRITERIA, YOU ARE CONSIDERED LOW RISK FOR COVID-19.  What to do if you are HIGH RISK for  COVID-19?  Marland Kitchen If you are having a medical emergency, call 911. . Seek medical care right away. Before you go to a doctor's office, urgent care or emergency department, call ahead and tell them about  your recent travel, contact with someone diagnosed with COVID-19, and your symptoms. You should receive instructions from your physician's office regarding next steps of care.  . When you arrive at healthcare provider, tell the healthcare staff immediately you have returned from visiting [place], [place], [place], [place] or [place]; or traveled in the [place] to [place], [place], [place], or [place]; in the last two weeks or you have been in close contact with a person diagnosed with COVID-19 in the last 2 weeks.   . Tell the health care staff about your symptoms: fever, cough and shortness of breath. . After you have been seen by a medical provider, you will be either: o Tested for (COVID-19) and discharged home on quarantine except to seek medical care if symptoms worsen, and asked to  - Stay home and avoid contact with others until you get your results (4-5 days)  - Avoid travel on public transportation if possible (such as bus, train, or airplane) or o Sent to the Emergency Department by EMS for evaluation, COVID-19 testing, and possible admission depending on your condition and test results.  What to do if you are LOW RISK for COVID-19?  Reduce your risk of any infection by using the same precautions used for avoiding the common cold or flu:  Marland Kitchen Wash your hands often with soap and warm water for at least 20 seconds.  If soap and water are not readily available, use an alcohol-based hand sanitizer with at least 60% alcohol.  . If coughing or sneezing, cover your mouth and nose by coughing or sneezing into the elbow areas of your shirt or coat, into a tissue or into your sleeve (not your hands). . Avoid shaking hands with others and consider head nods or verbal greetings only. . Avoid touching your eyes, nose, or mouth with unwashed hands.  . Avoid close contact with people who are sick. . Avoid places or events with large numbers of people in one location, like concerts or sporting  events. . Carefully consider travel plans you have or are making. . If you are planning any travel outside or inside the [place], visit the CDC's Travelers' Health webpage for the latest health notices. . If you have some symptoms but not all symptoms, continue to monitor at home and seek medical attention if your symptoms worsen. . If you are having a medical emergency, call 911.   [place] / e-Visit: [email or web address]         [facility]: [facility]: [id number]                   [facility]: [phone]

## 2019-06-01 ENCOUNTER — Telehealth: Payer: Self-pay | Admitting: *Deleted

## 2019-06-04 ENCOUNTER — Other Ambulatory Visit: Payer: Self-pay | Admitting: *Deleted

## 2019-06-04 DIAGNOSIS — C8118 Nodular sclerosis classical Hodgkin lymphoma, lymph nodes of multiple sites: Secondary | ICD-10-CM

## 2019-06-04 MED ORDER — LIDOCAINE-PRILOCAINE 2.5-2.5 % EX CREA: 1.0000 "application " | TOPICAL_CREAM | CUTANEOUS | 1 refills | Status: DC | PRN

## 2019-06-06 ENCOUNTER — Other Ambulatory Visit: Payer: Self-pay | Admitting: *Deleted

## 2019-06-06 ENCOUNTER — Encounter: Payer: Self-pay | Admitting: Internal Medicine

## 2019-06-06 DIAGNOSIS — C8118 Nodular sclerosis classical Hodgkin lymphoma, lymph nodes of multiple sites: Secondary | ICD-10-CM

## 2019-06-06 NOTE — Progress Notes (Signed)
Called pt to introduce myself as his Arboriculturist.  Pt has Family Planning Medicaid so I spoke to his wife suggesting they contact his case worker to get it changed to adult Medicaid.  She informed me that she has been trying to get it changed but having a hard time with DSS but will continue to keep trying.  That being the case I suggested applying for a discount thru the hospital.  She would really like that so I will leave an application with registration to give him on 06/07/19.  I also informed her of the [place], went over what it covers and gave her the income requirement.  She's the only one working right now so she will provide her proof of income.  Once received and if approved I will give him an expense sheet.  I requested that registration give him my card for any questions or concerns he may have in the future.

## 2019-06-07 ENCOUNTER — Other Ambulatory Visit: Payer: Self-pay

## 2019-06-07 ENCOUNTER — Inpatient Hospital Stay: Payer: Medicaid Other

## 2019-06-07 DIAGNOSIS — C8118 Nodular sclerosis classical Hodgkin lymphoma, lymph nodes of multiple sites: Secondary | ICD-10-CM

## 2019-06-07 DIAGNOSIS — Z5112 Encounter for antineoplastic immunotherapy: Secondary | ICD-10-CM | POA: Diagnosis not present

## 2019-06-07 LAB — CMP (CANCER CENTER ONLY)
ALT: 66 U/L — ABNORMAL HIGH (ref 0–44)
AST: 44 U/L — ABNORMAL HIGH (ref 15–41)
Albumin: 3 g/dL — ABNORMAL LOW (ref 3.5–5.0)
Alkaline Phosphatase: 154 U/L — ABNORMAL HIGH (ref 38–126)
Anion gap: 8 (ref 5–15)
BUN: 16 mg/dL (ref 6–20)
CO2: 26 mmol/L (ref 22–32)
Calcium: 8.8 mg/dL — ABNORMAL LOW (ref 8.9–10.3)
Chloride: 102 mmol/L (ref 98–111)
Creatinine: 0.76 mg/dL (ref 0.61–1.24)
GFR, Est AFR Am: >60 mL/min (ref >60)
GFR, Estimated: >60 mL/min (ref >60)
Glucose, Bld: 108 mg/dL — ABNORMAL HIGH (ref 70–99)
Potassium: 3.6 mmol/L (ref 3.5–5.1)
Sodium: 136 mmol/L (ref 135–145)
Total Bilirubin: 0.3 mg/dL (ref 0.3–1.2)
Total Protein: 7.7 g/dL (ref 6.5–8.1)

## 2019-06-07 LAB — CBC WITH DIFFERENTIAL (CANCER CENTER ONLY)
Abs Immature Granulocytes: 0.01 10*3/uL (ref 0.00–0.07)
Basophils Absolute: 0 10*3/uL (ref 0.0–0.1)
Basophils Relative: 1 %
Eosinophils Absolute: 0.1 10*3/uL (ref 0.0–0.5)
Eosinophils Relative: 2 %
HCT: 29.9 % — ABNORMAL LOW (ref 39.0–52.0)
Hemoglobin: 9.7 g/dL — ABNORMAL LOW (ref 13.0–17.0)
Immature Granulocytes: 0 %
Lymphocytes Relative: 45 %
Lymphs Abs: 1.2 10*3/uL (ref 0.7–4.0)
MCH: 28.8 pg (ref 26.0–34.0)
MCHC: 32.4 g/dL (ref 30.0–36.0)
MCV: 88.7 fL (ref 80.0–100.0)
Monocytes Absolute: 0.1 10*3/uL (ref 0.1–1.0)
Monocytes Relative: 5 %
Neutro Abs: 1.3 10*3/uL — ABNORMAL LOW (ref 1.7–7.7)
Neutrophils Relative %: 47 %
Platelet Count: 386 10*3/uL (ref 150–400)
RBC: 3.37 MIL/uL — ABNORMAL LOW (ref 4.22–5.81)
RDW: 13.6 % (ref 11.5–15.5)
WBC Count: 2.7 10*3/uL — ABNORMAL LOW (ref 4.0–10.5)
nRBC: 0 % (ref 0.0–0.2)

## 2019-06-07 LAB — LACTATE DEHYDROGENASE: LDH: 251 U/L — ABNORMAL HIGH (ref 98–192)

## 2019-06-12 ENCOUNTER — Other Ambulatory Visit: Payer: Self-pay | Admitting: Medical Oncology

## 2019-06-12 ENCOUNTER — Encounter: Payer: Self-pay | Admitting: Internal Medicine

## 2019-06-12 DIAGNOSIS — C8118 Nodular sclerosis classical Hodgkin lymphoma, lymph nodes of multiple sites: Secondary | ICD-10-CM

## 2019-06-12 MED ORDER — LIDOCAINE-PRILOCAINE 2.5-2.5 % EX CREA: 1.0000 "application " | TOPICAL_CREAM | CUTANEOUS | 1 refills | Status: AC | PRN

## 2019-06-12 MED ORDER — PROCHLORPERAZINE MALEATE 10 MG PO TABS: 10.0000 mg | ORAL_TABLET | Freq: Three times a day (TID) | ORAL | 2 refills | Status: DC | PRN

## 2019-06-12 MED FILL — LIDOCAINE-PRILOCAINE CREAM: 2.5-2.5 | 10 days supply | Qty: 30 | Fill #0

## 2019-06-12 MED FILL — PROCHLORPERAZINE 10 MG TAB: 10 | 10 days supply | Qty: 30 | Fill #0

## 2019-06-12 NOTE — Progress Notes (Signed)
Pt is approved for the $700 [facility] grant.  °

## 2019-06-13 ENCOUNTER — Inpatient Hospital Stay (HOSPITAL_BASED_OUTPATIENT_CLINIC_OR_DEPARTMENT_OTHER): Payer: Medicaid Other | Admitting: Internal Medicine

## 2019-06-13 ENCOUNTER — Inpatient Hospital Stay: Payer: Medicaid Other

## 2019-06-13 ENCOUNTER — Encounter: Payer: Self-pay | Admitting: Internal Medicine

## 2019-06-13 ENCOUNTER — Other Ambulatory Visit: Payer: Self-pay

## 2019-06-13 VITALS — BP 126/75 | HR 65 | Temp 98.6°F | Resp 17 | Ht 66.0 in | Wt 138.0 lb

## 2019-06-13 DIAGNOSIS — Z5111 Encounter for antineoplastic chemotherapy: Secondary | ICD-10-CM

## 2019-06-13 DIAGNOSIS — C8118 Nodular sclerosis classical Hodgkin lymphoma, lymph nodes of multiple sites: Secondary | ICD-10-CM

## 2019-06-13 DIAGNOSIS — G893 Neoplasm related pain (acute) (chronic): Secondary | ICD-10-CM

## 2019-06-13 DIAGNOSIS — Z5112 Encounter for antineoplastic immunotherapy: Secondary | ICD-10-CM | POA: Diagnosis not present

## 2019-06-13 DIAGNOSIS — Z95828 Presence of other vascular implants and grafts: Secondary | ICD-10-CM

## 2019-06-13 LAB — CBC WITH DIFFERENTIAL (CANCER CENTER ONLY)
Abs Immature Granulocytes: 0 10*3/uL (ref 0.00–0.07)
Basophils Absolute: 0 10*3/uL (ref 0.0–0.1)
Basophils Relative: 1 %
Eosinophils Absolute: 0.1 10*3/uL (ref 0.0–0.5)
Eosinophils Relative: 1 %
HCT: 30.5 % — ABNORMAL LOW (ref 39.0–52.0)
Hemoglobin: 10.1 g/dL — ABNORMAL LOW (ref 13.0–17.0)
Immature Granulocytes: 0 %
Lymphocytes Relative: 37 %
Lymphs Abs: 1.3 10*3/uL (ref 0.7–4.0)
MCH: 29.1 pg (ref 26.0–34.0)
MCHC: 33.1 g/dL (ref 30.0–36.0)
MCV: 87.9 fL (ref 80.0–100.0)
Monocytes Absolute: 0.5 10*3/uL (ref 0.1–1.0)
Monocytes Relative: 15 %
Neutro Abs: 1.6 10*3/uL — ABNORMAL LOW (ref 1.7–7.7)
Neutrophils Relative %: 46 %
Platelet Count: 342 10*3/uL (ref 150–400)
RBC: 3.47 MIL/uL — ABNORMAL LOW (ref 4.22–5.81)
RDW: 14.6 % (ref 11.5–15.5)
WBC Count: 3.5 10*3/uL — ABNORMAL LOW (ref 4.0–10.5)
nRBC: 0 % (ref 0.0–0.2)

## 2019-06-13 LAB — CMP (CANCER CENTER ONLY)
ALT: 69 U/L — ABNORMAL HIGH (ref 0–44)
AST: 35 U/L (ref 15–41)
Albumin: 3.1 g/dL — ABNORMAL LOW (ref 3.5–5.0)
Alkaline Phosphatase: 129 U/L — ABNORMAL HIGH (ref 38–126)
Anion gap: 9 (ref 5–15)
BUN: 14 mg/dL (ref 6–20)
CO2: 27 mmol/L (ref 22–32)
Calcium: 8.9 mg/dL (ref 8.9–10.3)
Chloride: 103 mmol/L (ref 98–111)
Creatinine: 0.8 mg/dL (ref 0.61–1.24)
GFR, Est AFR Am: >60 mL/min (ref >60)
GFR, Estimated: >60 mL/min (ref >60)
Glucose, Bld: 77 mg/dL (ref 70–99)
Potassium: 4 mmol/L (ref 3.5–5.1)
Sodium: 139 mmol/L (ref 135–145)
Total Bilirubin: 0.4 mg/dL (ref 0.3–1.2)
Total Protein: 7.4 g/dL (ref 6.5–8.1)

## 2019-06-13 LAB — LACTATE DEHYDROGENASE: LDH: 162 U/L (ref 98–192)

## 2019-06-13 MED ORDER — ACETAMINOPHEN 325 MG PO TABS
650.0000 mg | ORAL_TABLET | Freq: Once | ORAL | Status: AC
  Administered 2019-06-13: 650 mg via ORAL

## 2019-06-13 MED ORDER — PALONOSETRON HCL INJECTION 0.25 MG/5ML
INTRAVENOUS | Status: AC
  Filled 2019-06-13: qty 5

## 2019-06-13 MED ORDER — ACETAMINOPHEN 325 MG PO TABS
ORAL_TABLET | ORAL | Status: AC
  Filled 2019-06-13: qty 2

## 2019-06-13 MED ORDER — DIPHENHYDRAMINE HCL 50 MG/ML IJ SOLN
INTRAMUSCULAR | Status: AC
  Filled 2019-06-13: qty 1

## 2019-06-13 MED ORDER — SODIUM CHLORIDE 0.9 % IV SOLN
375.0000 mg/m2 | Freq: Once | INTRAVENOUS | Status: AC
  Administered 2019-06-13: 650 mg via INTRAVENOUS
  Filled 2019-06-13: qty 65

## 2019-06-13 MED ORDER — SODIUM CHLORIDE 0.9 % IV SOLN
1.2000 mg/kg | Freq: Once | INTRAVENOUS | Status: AC
  Administered 2019-06-13: 75 mg via INTRAVENOUS
  Filled 2019-06-13: qty 15

## 2019-06-13 MED ORDER — SODIUM CHLORIDE 0.9 % IV SOLN
Freq: Once | INTRAVENOUS | Status: AC
  Administered 2019-06-13: 13:00:00 via INTRAVENOUS
  Filled 2019-06-13: qty 250

## 2019-06-13 MED ORDER — SODIUM CHLORIDE 0.9% FLUSH
10.0000 mL | INTRAVENOUS | Status: DC | PRN
  Administered 2019-06-13: 10 mL
  Filled 2019-06-13: qty 10

## 2019-06-13 MED ORDER — HEPARIN SOD (PORK) LOCK FLUSH 100 UNIT/ML IV SOLN
500.0000 [IU] | Freq: Once | INTRAVENOUS | Status: AC | PRN
  Administered 2019-06-13: 500 [IU]
  Filled 2019-06-13: qty 5

## 2019-06-13 MED ORDER — SODIUM CHLORIDE 0.9 % IV SOLN
Freq: Once | INTRAVENOUS | Status: AC
  Administered 2019-06-13: 14:00:00 via INTRAVENOUS
  Filled 2019-06-13: qty 5

## 2019-06-13 MED ORDER — VINBLASTINE SULFATE CHEMO INJECTION 1 MG/ML
5.8000 mg/m2 | Freq: Once | INTRAVENOUS | Status: AC
  Administered 2019-06-13: 10 mg via INTRAVENOUS
  Filled 2019-06-13: qty 10

## 2019-06-13 MED ORDER — SODIUM CHLORIDE 0.9% FLUSH
10.0000 mL | INTRAVENOUS | Status: DC | PRN
  Administered 2019-06-13: 12:00:00 10 mL via INTRAVENOUS
  Filled 2019-06-13: qty 10

## 2019-06-13 MED ORDER — DIPHENHYDRAMINE HCL 50 MG/ML IJ SOLN
50.0000 mg | Freq: Once | INTRAMUSCULAR | Status: AC
  Administered 2019-06-13: 50 mg via INTRAVENOUS

## 2019-06-13 MED ORDER — DOXORUBICIN HCL CHEMO IV INJECTION 2 MG/ML
25.0000 mg/m2 | Freq: Once | INTRAVENOUS | Status: AC
  Administered 2019-06-13: 44 mg via INTRAVENOUS
  Filled 2019-06-13: qty 22

## 2019-06-13 MED ORDER — PALONOSETRON HCL INJECTION 0.25 MG/5ML
0.2500 mg | Freq: Once | INTRAVENOUS | Status: AC
  Administered 2019-06-13: 0.25 mg via INTRAVENOUS

## 2019-06-13 NOTE — Progress Notes (Signed)
[facility] Telephone:([phone]) [phone]   Fax:([phone]) [phone]  OFFICE PROGRESS NOTE  Patient, No Pcp Per No address on file  DIAGNOSIS: Stage IVB Hodgkin's Lymphoma, nodular sclerosis subtype. He presented with a bulky left cervical and supraclavicular lymphadenopathy as well as involvement of the spleen, nodal disease in the porta hepatis, and extensive disease involving the marrow of the pelvis, spine, and ribs. He was diagnosed in September 2020.   IPS score: 4   PRIOR THERAPY: None  CURRENT THERAPY: Systemic chemotherapy with brentuximab vedotin 1.2 mg/kg, doxorubicin 25 mg/m2, vinblastine 6 mg/m2, dacarbazine 375 mg/m2 on days 1 and 15 every 28 days. First dose expected on 05/30/2019.   INTERVAL HISTORY: Jesse [name] 54 y.o. male returns to the clinic today for follow-up visit.  The patient is feeling much better and he noticed shrinkage in the lymph nodes in the left side of the neck.  He also notes improvement in his pain level after starting the first cycle of treatment.  He denied having any current chest pain, shortness of breath, cough or hemoptysis.  He denied having any fever or chills.  He had few episodes of nausea after the treatment but completely resolved.  He has no current nausea, vomiting, diarrhea or constipation.  He denied having any headache or visual changes.  He is here today for evaluation before starting day 15 of the first cycle.  MEDICAL HISTORY: Past Medical History:  Diagnosis Date   ETOH abuse    Lymphadenopathy    left   Marijuana abuse     ALLERGIES:  has No Known Allergies.  MEDICATIONS:  Current Outpatient Medications  Medication Sig Dispense Refill   acetaminophen (TYLENOL) 500 MG tablet Take 1,000 mg by mouth every 6 (six) hours as needed.     allopurinol (ZYLOPRIM) 100 MG tablet Take 1 tablet (100 mg total) by mouth 2 (two) times daily. 60 tablet 2   ibuprofen (ADVIL) 800 MG tablet Take 800 mg by mouth  every 8 (eight) hours as needed.     lidocaine-prilocaine (EMLA) cream Apply 1 application topically as needed. Apply to port site  45 minutes prior to port being accessed 30 g 1   oxyCODONE-acetaminophen (PERCOCET/ROXICET) 5-325 MG tablet Take 1 tablet by mouth every 8 (eight) hours as needed for severe pain. 45 tablet 0   prochlorperazine (COMPAZINE) 10 MG tablet Take 1 tablet (10 mg total) by mouth every 8 (eight) hours as needed for nausea or vomiting. 30 tablet 2   No current facility-administered medications for this visit.     SURGICAL HISTORY:  Past Surgical History:  Procedure Laterality Date   HERNIA REPAIR     as child   IR IMAGING GUIDED PORT INSERTION  05/29/2019   LYMPH NODE BIOPSY Left 05/07/2019   Procedure: LYMPH NODE BIOPSY;  Surgeon: Melida Quitter, MD;  Location: [facility];  Service: ENT;  Laterality: Left;    REVIEW OF SYSTEMS:  A comprehensive review of systems was negative except for: Constitutional: positive for fatigue   PHYSICAL EXAMINATION: General appearance: alert, cooperative and no distress Head: Normocephalic, without obvious abnormality, atraumatic Neck: no adenopathy, no JVD, supple, symmetrical, trachea midline and thyroid not enlarged, symmetric, no tenderness/mass/nodules Lymph nodes: Cervical, supraclavicular, and axillary nodes normal. Resp: clear to auscultation bilaterally Back: symmetric, no curvature. ROM normal. No CVA tenderness. Cardio: regular rate and rhythm, S1, S2 normal, no murmur, click, rub or gallop GI: soft, non-tender; bowel sounds normal; no masses,  no organomegaly Extremities: extremities normal, atraumatic, no cyanosis or edema  ECOG PERFORMANCE STATUS: 1 - Symptomatic but completely ambulatory  Blood pressure 126/75, pulse 65, temperature 98.6 F (37 C), temperature source Oral, resp. rate 17, height 5\' 6"  (1.676 m), weight 138 lb (62.6 kg), SpO2 100 %.  LABORATORY DATA: Lab Results  Component Value  Date   WBC 3.5 (L) 06/13/2019   HGB 10.1 (L) 06/13/2019   HCT 30.5 (L) 06/13/2019   MCV 87.9 06/13/2019   PLT 342 06/13/2019      Chemistry      Component Value Date/Time   NA 136 06/07/2019 1449   K 3.6 06/07/2019 1449   CL 102 06/07/2019 1449   CO2 26 06/07/2019 1449   BUN 16 06/07/2019 1449   CREATININE 0.76 06/07/2019 1449      Component Value Date/Time   CALCIUM 8.8 (L) 06/07/2019 1449   ALKPHOS 154 (H) 06/07/2019 1449   AST 44 (H) 06/07/2019 1449   ALT 66 (H) 06/07/2019 1449   BILITOT 0.3 06/07/2019 1449       RADIOGRAPHIC STUDIES: Nm Pet Image Initial (pi) Skull Base To Thigh  Result Date: 05/21/2019 CLINICAL DATA:  Initial treatment strategy for Hodgkin's lymphoma. EXAM: NUCLEAR MEDICINE PET SKULL BASE TO THIGH TECHNIQUE: 7.5 mCi F-18 FDG was injected intravenously. Full-ring PET imaging was performed from the skull base to thigh after the radiotracer. CT data was obtained and used for attenuation correction and anatomic localization. Fasting blood glucose: 85 mg/dl COMPARISON:  CT 04/18/2019 FINDINGS: Mediastinal blood pool activity: SUV max 2.5 Liver activity: SUV max 2.3 NECK: Bulky hypermetabolic nodal mass extending from the LEFT level II nodal station to involve the level III nodal stations, level V nodal stations and LEFT supraclavicular nodal stations. At the level of LEFT level 2 nodes, the nodes measure up to 3 cm short axis with SUV max equal 19.0. The level of the the LEFT supraclavicular nodes, nodes measure up to 2.8 cm SUV max equal 23.6. A single small hypermetabolic RIGHT level 2 lymph node with SUV max equal 3.5. Incidental CT findings: None CHEST: No hypermetabolic mediastinal lymph nodes. No suspicious pulmonary nodules. Incidental CT findings: none ABDOMEN/PELVIS: There is several discrete foci of hypermetabolic activity within the spleen which are poorly defined on the noncontrast CT. Lesion in the medial spleen with SUV max equal 7.8. Second lesion in the  posterior spleen with SUV max equal 7.8. Spleen is normal volume. No focal activity within the liver. There are hypermetabolic periportal lymph nodes with SUV max equal 7.9. Small hypermetabolic upper periaortic lymph nodes present. No hypermetabolic iliac or inguinal nodes. Incidental CT findings: No splenomegaly SKELETON: There are multiple intense foci of radiotracer activity throughout the spine, ribs, pelvis. For example lesion at the L4 vertebral body level with SUV max equal 17. Lesion in the LEFT sacrum with SUV max equal 9.5. They are approximately 50 lesions within the skeleton. These lesions are occult by CT imaging. Incidental CT findings: No fracture. IMPRESSION: 1. Intensely hypermetabolic nodal mass in the LEFT neck consistent with lymphoma. 2. Minimally enlarged hypermetabolic nodal disease in the porta hepatis and along the aorta. 3. While the spleen is normal volume, several foci of metabolic activity in the spleen consistent with lymphoma. 4. Extensive intensely hypermetabolic metastasis within the marrow space of the pelvis, spine and ribs. Lesions are CT occult. Electronically Signed   By: Suzy Bouchard M.D.   On: 05/21/2019 15:51   Ir Imaging Guided Port Insertion  Result Date: 05/29/2019 INDICATION: 53 year old with Hodgkin lymphoma.  Port-A-Cath needed for therapy. EXAM: FLUOROSCOPIC AND ULTRASOUND GUIDED PLACEMENT OF A SUBCUTANEOUS PORT COMPARISON:  None. MEDICATIONS: Ancef 2 g; The antibiotic was administered within an appropriate time interval prior to skin puncture. ANESTHESIA/SEDATION: Versed 4.0 mg IV; Fentanyl 100 mcg IV; Moderate Sedation Time:  29 minutes The patient was continuously monitored during the procedure by the interventional radiology nurse under my direct supervision. FLUOROSCOPY TIME:  30 seconds, 3 mGy COMPLICATIONS: None immediate. PROCEDURE: The procedure, risks, benefits, and alternatives were explained to the patient. Questions regarding the procedure were  encouraged and answered. The patient understands and consents to the procedure. Patient was placed supine on the interventional table. Ultrasound confirmed a patent right internal jugular vein. Ultrasound image saved for documentation. The right chest and neck were cleaned with a skin antiseptic and a sterile drape was placed. Maximal barrier sterile technique was utilized including caps, mask, sterile gowns, sterile gloves, sterile drape, hand hygiene and skin antiseptic. The right neck was anesthetized with 1% lidocaine. Small incision was made in the right neck with a blade. Micropuncture set was placed in the right internal jugular vein with ultrasound guidance. The micropuncture wire was used for measurement purposes. The right chest was anesthetized with 1% lidocaine with epinephrine. #15 blade was used to make an incision and a subcutaneous port pocket was formed. [place] was assembled. Subcutaneous tunnel was formed with a stiff tunneling device. The port catheter was brought through the subcutaneous tunnel. The port was placed in the subcutaneous pocket. The micropuncture set was exchanged for a peel-away sheath. The catheter was placed through the peel-away sheath and the tip was positioned at the SVC and right atrium junction. Catheter placement was confirmed with fluoroscopy. The port was accessed and flushed with heparinized saline. The port pocket was closed using two layers of absorbable sutures and Dermabond. The vein skin site was closed using a single layer of absorbable suture and Dermabond. Sterile dressings were applied. Patient tolerated the procedure well without an immediate complication. Ultrasound and fluoroscopic images were taken and saved for this procedure. IMPRESSION: Placement of a subcutaneous port device. Catheter tip at the SVC and right atrium junction. Electronically Signed   By: Markus Daft M.D.   On: 05/29/2019 13:28    ASSESSMENT AND PLAN: This is a very pleasant  53 years old African-American male recently diagnosed with a stage IV non-Hodgkin lymphoma, nodular sclerosing subtype.  The patient is currently undergoing systemic chemotherapy with brentuximab Vedotin in addition to doxorubicin, vinblastine and dacarbazine.  Status post day 1 of cycle #1. The patient tolerated the first dose of his treatment well except for mild nausea for few days.  He has significant improvement in his symptoms after the first dose of his treatment. I recommended for him to proceed with day 15 today as planned. I will see the patient back for follow-up visit in 2 weeks for evaluation before starting cycle #2. He was advised to call immediately if he has any concerning symptoms in the interval. The patient voices understanding of current disease status and treatment options and is in agreement with the current care plan. All questions were answered. The patient knows to call the clinic with any problems, questions or concerns. We can certainly see the patient much sooner if necessary.  I spent 10 minutes counseling the patient face to face. The total time spent in the appointment was 15 minutes.  Disclaimer: This note was dictated  with voice recognition software. Similar sounding words can inadvertently be transcribed and may not be corrected upon review.

## 2019-06-13 NOTE — Progress Notes (Signed)
Add Udenyca after each treatment starting with treatment per Dr. Julien Nordmann. Orders updated.  Demetrius Charity, PharmD, [facility] Pharmacist Pharmacy Phone: [phone] 06/13/2019

## 2019-06-13 NOTE — Patient Instructions (Signed)
[facility] Discharge Instructions for Patients Receiving Chemotherapy  Today you received the following chemotherapy agents:  Doxorubicin, Vinblastine, Dacarbazine, Brentuximab  To help prevent nausea and vomiting after your treatment, we encourage you to take your nausea medication as prescribed.    If you develop nausea and vomiting that is not controlled by your nausea medication, call the clinic.   BELOW ARE SYMPTOMS THAT SHOULD BE REPORTED IMMEDIATELY:  *FEVER GREATER THAN 100.5 F  *CHILLS WITH OR WITHOUT FEVER  NAUSEA AND VOMITING THAT IS NOT CONTROLLED WITH YOUR NAUSEA MEDICATION  *UNUSUAL SHORTNESS OF BREATH  *UNUSUAL BRUISING OR BLEEDING  TENDERNESS IN MOUTH AND THROAT WITH OR WITHOUT PRESENCE OF ULCERS  *URINARY PROBLEMS  *BOWEL PROBLEMS  UNUSUAL RASH Items with * indicate a potential emergency and should be followed up as soon as possible.  Feel free to call the clinic should you have any questions or concerns. The clinic phone number is [phone].  Please show the [place] at check-in to the Emergency Department and triage nurse.

## 2019-06-15 ENCOUNTER — Other Ambulatory Visit: Payer: Self-pay

## 2019-06-15 ENCOUNTER — Inpatient Hospital Stay: Payer: Medicaid Other

## 2019-06-15 ENCOUNTER — Encounter: Payer: Self-pay | Admitting: Pharmacy Technician

## 2019-06-15 VITALS — BP 142/92 | HR 60 | Temp 98.2°F | Resp 18

## 2019-06-15 DIAGNOSIS — Z5112 Encounter for antineoplastic immunotherapy: Secondary | ICD-10-CM | POA: Diagnosis not present

## 2019-06-15 DIAGNOSIS — C8118 Nodular sclerosis classical Hodgkin lymphoma, lymph nodes of multiple sites: Secondary | ICD-10-CM

## 2019-06-15 MED ORDER — PEGFILGRASTIM-CBQV 6 MG/0.6ML ~~LOC~~ SOSY
6.0000 mg | PREFILLED_SYRINGE | Freq: Once | SUBCUTANEOUS | Status: AC
  Administered 2019-06-15: 6 mg via SUBCUTANEOUS

## 2019-06-15 MED ORDER — PEGFILGRASTIM-CBQV 6 MG/0.6ML ~~LOC~~ SOSY
PREFILLED_SYRINGE | SUBCUTANEOUS | Status: AC
  Filled 2019-06-15: qty 0.6

## 2019-06-15 NOTE — Progress Notes (Signed)
The patient is approved for drug assistance by [employer] for Udenyca.  Enrollment is effective until 06/14/20 and is based on self-pay. Drug replacement begins on DOS 06/15/19.

## 2019-06-15 NOTE — Patient Instructions (Signed)

## 2019-06-20 ENCOUNTER — Other Ambulatory Visit: Payer: Self-pay

## 2019-06-20 ENCOUNTER — Other Ambulatory Visit: Payer: Self-pay | Admitting: Physician Assistant

## 2019-06-20 ENCOUNTER — Inpatient Hospital Stay: Payer: Medicaid Other | Attending: Internal Medicine

## 2019-06-20 DIAGNOSIS — G893 Neoplasm related pain (acute) (chronic): Secondary | ICD-10-CM

## 2019-06-20 DIAGNOSIS — R5383 Other fatigue: Secondary | ICD-10-CM | POA: Diagnosis not present

## 2019-06-20 DIAGNOSIS — C8118 Nodular sclerosis classical Hodgkin lymphoma, lymph nodes of multiple sites: Secondary | ICD-10-CM | POA: Insufficient documentation

## 2019-06-20 DIAGNOSIS — Z79899 Other long term (current) drug therapy: Secondary | ICD-10-CM | POA: Insufficient documentation

## 2019-06-20 DIAGNOSIS — Z5111 Encounter for antineoplastic chemotherapy: Secondary | ICD-10-CM | POA: Diagnosis present

## 2019-06-20 DIAGNOSIS — Z5112 Encounter for antineoplastic immunotherapy: Secondary | ICD-10-CM | POA: Insufficient documentation

## 2019-06-20 DIAGNOSIS — R591 Generalized enlarged lymph nodes: Secondary | ICD-10-CM | POA: Diagnosis not present

## 2019-06-20 DIAGNOSIS — R232 Flushing: Secondary | ICD-10-CM | POA: Insufficient documentation

## 2019-06-20 DIAGNOSIS — Z5189 Encounter for other specified aftercare: Secondary | ICD-10-CM | POA: Insufficient documentation

## 2019-06-20 DIAGNOSIS — M545 Low back pain: Secondary | ICD-10-CM | POA: Insufficient documentation

## 2019-06-20 LAB — CMP (CANCER CENTER ONLY)
ALT: 49 U/L — ABNORMAL HIGH (ref 0–44)
AST: 29 U/L (ref 15–41)
Albumin: 3.7 g/dL (ref 3.5–5.0)
Alkaline Phosphatase: 247 U/L — ABNORMAL HIGH (ref 38–126)
Anion gap: 12 (ref 5–15)
BUN: 23 mg/dL — ABNORMAL HIGH (ref 6–20)
CO2: 23 mmol/L (ref 22–32)
Calcium: 9.1 mg/dL (ref 8.9–10.3)
Chloride: 104 mmol/L (ref 98–111)
Creatinine: 0.88 mg/dL (ref 0.61–1.24)
GFR, Est AFR Am: >60 mL/min (ref >60)
GFR, Estimated: >60 mL/min (ref >60)
Glucose, Bld: 113 mg/dL — ABNORMAL HIGH (ref 70–99)
Potassium: 3.3 mmol/L — ABNORMAL LOW (ref 3.5–5.1)
Sodium: 139 mmol/L (ref 135–145)
Total Bilirubin: 0.3 mg/dL (ref 0.3–1.2)
Total Protein: 7.6 g/dL (ref 6.5–8.1)

## 2019-06-20 LAB — CBC WITH DIFFERENTIAL (CANCER CENTER ONLY)
Abs Immature Granulocytes: 2.57 10*3/uL — ABNORMAL HIGH (ref 0.00–0.07)
Basophils Absolute: 0.2 10*3/uL — ABNORMAL HIGH (ref 0.0–0.1)
Basophils Relative: 1 %
Eosinophils Absolute: 0.1 10*3/uL (ref 0.0–0.5)
Eosinophils Relative: 0 %
HCT: 31.2 % — ABNORMAL LOW (ref 39.0–52.0)
Hemoglobin: 10.1 g/dL — ABNORMAL LOW (ref 13.0–17.0)
Immature Granulocytes: 10 %
Lymphocytes Relative: 9 %
Lymphs Abs: 2.5 10*3/uL (ref 0.7–4.0)
MCH: 29.4 pg (ref 26.0–34.0)
MCHC: 32.4 g/dL (ref 30.0–36.0)
MCV: 90.7 fL (ref 80.0–100.0)
Monocytes Absolute: 2.7 10*3/uL — ABNORMAL HIGH (ref 0.1–1.0)
Monocytes Relative: 10 %
Neutro Abs: 18.6 10*3/uL — ABNORMAL HIGH (ref 1.7–7.7)
Neutrophils Relative %: 70 %
Platelet Count: 299 10*3/uL (ref 150–400)
RBC: 3.44 MIL/uL — ABNORMAL LOW (ref 4.22–5.81)
RDW: 15.2 % (ref 11.5–15.5)
WBC Count: 26.6 10*3/uL — ABNORMAL HIGH (ref 4.0–10.5)
nRBC: 0.5 % — ABNORMAL HIGH (ref 0.0–0.2)

## 2019-06-20 LAB — LACTATE DEHYDROGENASE: LDH: 586 U/L — ABNORMAL HIGH (ref 98–192)

## 2019-06-20 MED ORDER — OXYCODONE-ACETAMINOPHEN 5-325 MG PO TABS: 1.0000 | ORAL_TABLET | Freq: Three times a day (TID) | ORAL | 0 refills | Status: DC | PRN

## 2019-06-20 MED FILL — OXYCODONE-ACETAMINOPHEN 5-3: 5-325 | 10 days supply | Qty: 30 | Fill #0

## 2019-06-27 ENCOUNTER — Other Ambulatory Visit: Payer: Self-pay

## 2019-06-27 ENCOUNTER — Inpatient Hospital Stay: Payer: Medicaid Other

## 2019-06-27 ENCOUNTER — Inpatient Hospital Stay (HOSPITAL_BASED_OUTPATIENT_CLINIC_OR_DEPARTMENT_OTHER): Payer: Medicaid Other | Admitting: Internal Medicine

## 2019-06-27 ENCOUNTER — Other Ambulatory Visit: Payer: Self-pay | Admitting: Internal Medicine

## 2019-06-27 ENCOUNTER — Encounter: Payer: Self-pay | Admitting: Internal Medicine

## 2019-06-27 ENCOUNTER — Telehealth: Payer: Self-pay | Admitting: Internal Medicine

## 2019-06-27 VITALS — BP 121/90 | HR 87 | Temp 98.3°F | Resp 18 | Ht 66.0 in | Wt 136.6 lb

## 2019-06-27 DIAGNOSIS — Z5112 Encounter for antineoplastic immunotherapy: Secondary | ICD-10-CM | POA: Diagnosis not present

## 2019-06-27 DIAGNOSIS — Z95828 Presence of other vascular implants and grafts: Secondary | ICD-10-CM

## 2019-06-27 DIAGNOSIS — C8118 Nodular sclerosis classical Hodgkin lymphoma, lymph nodes of multiple sites: Secondary | ICD-10-CM

## 2019-06-27 DIAGNOSIS — Z5111 Encounter for antineoplastic chemotherapy: Secondary | ICD-10-CM

## 2019-06-27 LAB — CBC WITH DIFFERENTIAL (CANCER CENTER ONLY)
Abs Immature Granulocytes: 0.46 10*3/uL — ABNORMAL HIGH (ref 0.00–0.07)
Basophils Absolute: 0 10*3/uL (ref 0.0–0.1)
Basophils Relative: 1 %
Eosinophils Absolute: 0 10*3/uL (ref 0.0–0.5)
Eosinophils Relative: 0 %
HCT: 34.2 % — ABNORMAL LOW (ref 39.0–52.0)
Hemoglobin: 11.3 g/dL — ABNORMAL LOW (ref 13.0–17.0)
Immature Granulocytes: 9 %
Lymphocytes Relative: 23 %
Lymphs Abs: 1.2 10*3/uL (ref 0.7–4.0)
MCH: 29 pg (ref 26.0–34.0)
MCHC: 33 g/dL (ref 30.0–36.0)
MCV: 87.9 fL (ref 80.0–100.0)
Monocytes Absolute: 0.7 10*3/uL (ref 0.1–1.0)
Monocytes Relative: 15 %
Neutro Abs: 2.6 10*3/uL (ref 1.7–7.7)
Neutrophils Relative %: 52 %
Platelet Count: 180 10*3/uL (ref 150–400)
RBC: 3.89 MIL/uL — ABNORMAL LOW (ref 4.22–5.81)
RDW: 16 % — ABNORMAL HIGH (ref 11.5–15.5)
WBC Count: 5.1 10*3/uL (ref 4.0–10.5)
nRBC: 0 % (ref 0.0–0.2)

## 2019-06-27 LAB — CMP (CANCER CENTER ONLY)
ALT: 24 U/L (ref 0–44)
AST: 18 U/L (ref 15–41)
Albumin: 3.7 g/dL (ref 3.5–5.0)
Alkaline Phosphatase: 160 U/L — ABNORMAL HIGH (ref 38–126)
Anion gap: 9 (ref 5–15)
BUN: 16 mg/dL (ref 6–20)
CO2: 25 mmol/L (ref 22–32)
Calcium: 9.1 mg/dL (ref 8.9–10.3)
Chloride: 103 mmol/L (ref 98–111)
Creatinine: 0.77 mg/dL (ref 0.61–1.24)
GFR, Est AFR Am: >60 mL/min (ref >60)
GFR, Estimated: >60 mL/min (ref >60)
Glucose, Bld: 121 mg/dL — ABNORMAL HIGH (ref 70–99)
Potassium: 4 mmol/L (ref 3.5–5.1)
Sodium: 137 mmol/L (ref 135–145)
Total Bilirubin: 0.4 mg/dL (ref 0.3–1.2)
Total Protein: 7.9 g/dL (ref 6.5–8.1)

## 2019-06-27 LAB — LACTATE DEHYDROGENASE: LDH: 233 U/L — ABNORMAL HIGH (ref 98–192)

## 2019-06-27 MED ORDER — SODIUM CHLORIDE 0.9 % IV SOLN
375.0000 mg/m2 | Freq: Once | INTRAVENOUS | Status: AC
  Administered 2019-06-27: 650 mg via INTRAVENOUS
  Filled 2019-06-27: qty 65

## 2019-06-27 MED ORDER — ACETAMINOPHEN 325 MG PO TABS
ORAL_TABLET | ORAL | Status: AC
  Filled 2019-06-27: qty 2

## 2019-06-27 MED ORDER — SODIUM CHLORIDE 0.9% FLUSH
10.0000 mL | INTRAVENOUS | Status: DC | PRN
  Administered 2019-06-27: 10 mL
  Filled 2019-06-27: qty 10

## 2019-06-27 MED ORDER — SODIUM CHLORIDE 0.9 % IV SOLN
Freq: Once | INTRAVENOUS | Status: AC
  Administered 2019-06-27: 12:00:00 via INTRAVENOUS
  Filled 2019-06-27: qty 5

## 2019-06-27 MED ORDER — VINBLASTINE SULFATE CHEMO INJECTION 1 MG/ML
5.8000 mg/m2 | Freq: Once | INTRAVENOUS | Status: AC
  Administered 2019-06-27: 10 mg via INTRAVENOUS
  Filled 2019-06-27: qty 10

## 2019-06-27 MED ORDER — SODIUM CHLORIDE 0.9 % IV SOLN
1.2000 mg/kg | Freq: Once | INTRAVENOUS | Status: AC
  Administered 2019-06-27: 75 mg via INTRAVENOUS
  Filled 2019-06-27: qty 15

## 2019-06-27 MED ORDER — SODIUM CHLORIDE 0.9% FLUSH
10.0000 mL | INTRAVENOUS | Status: DC | PRN
  Administered 2019-06-27: 10:00:00 10 mL via INTRAVENOUS
  Filled 2019-06-27: qty 10

## 2019-06-27 MED ORDER — PALONOSETRON HCL INJECTION 0.25 MG/5ML
0.2500 mg | Freq: Once | INTRAVENOUS | Status: AC
  Administered 2019-06-27: 11:00:00 0.25 mg via INTRAVENOUS

## 2019-06-27 MED ORDER — ACETAMINOPHEN 325 MG PO TABS
ORAL_TABLET | ORAL | Status: AC
  Filled 2019-06-27: qty 1

## 2019-06-27 MED ORDER — DOXORUBICIN HCL CHEMO IV INJECTION 2 MG/ML
25.0000 mg/m2 | Freq: Once | INTRAVENOUS | Status: AC
  Administered 2019-06-27: 44 mg via INTRAVENOUS
  Filled 2019-06-27: qty 22

## 2019-06-27 MED ORDER — ACETAMINOPHEN 325 MG PO TABS
650.0000 mg | ORAL_TABLET | Freq: Once | ORAL | Status: AC
  Administered 2019-06-27: 650 mg via ORAL

## 2019-06-27 MED ORDER — HEPARIN SOD (PORK) LOCK FLUSH 100 UNIT/ML IV SOLN
500.0000 [IU] | Freq: Once | INTRAVENOUS | Status: AC | PRN
  Administered 2019-06-27: 500 [IU]
  Filled 2019-06-27: qty 5

## 2019-06-27 MED ORDER — PALONOSETRON HCL INJECTION 0.25 MG/5ML
INTRAVENOUS | Status: AC
  Filled 2019-06-27: qty 5

## 2019-06-27 MED ORDER — DIPHENHYDRAMINE HCL 50 MG/ML IJ SOLN
50.0000 mg | Freq: Once | INTRAMUSCULAR | Status: AC
  Administered 2019-06-27: 50 mg via INTRAVENOUS

## 2019-06-27 MED ORDER — DIPHENHYDRAMINE HCL 50 MG/ML IJ SOLN
INTRAMUSCULAR | Status: AC
  Filled 2019-06-27: qty 1

## 2019-06-27 MED ORDER — SODIUM CHLORIDE 0.9 % IV SOLN
Freq: Once | INTRAVENOUS | Status: AC
  Administered 2019-06-27: 11:00:00 via INTRAVENOUS
  Filled 2019-06-27: qty 250

## 2019-06-27 NOTE — Progress Notes (Signed)
[facility] [phone]   [phone]  OFFICE PROGRESS NOTE  Patient, No Pcp Per No address on file  DIAGNOSIS: Stage IVB Hodgkin's Lymphoma, nodular sclerosis subtype. He presented with a bulky left cervical and supraclavicular lymphadenopathy as well as involvement of the spleen, nodal disease in the porta hepatis, and extensive disease involving the marrow of the pelvis, spine, and ribs. He was diagnosed in September 2020.   IPS score: 4   PRIOR THERAPY: None  CURRENT THERAPY: Systemic chemotherapy with brentuximab vedotin 1.2 mg/kg, doxorubicin 25 mg/m2, vinblastine 6 mg/m2, dacarbazine 375 mg/m2 on days 1 and 15 every 28 days. First dose expected on 05/30/2019.   Status post 1 cycle.  INTERVAL HISTORY: Jesse [name] 53 y.o. male returns to the clinic today for follow-up visit.  The patient is feeling fine today with no concerning complaints.  The swelling in his left side of the neck has significantly improved.  He continues to have intermittent low back pain.  He denied having any current chest pain, shortness of breath, cough or hemoptysis.  He denied having any fever or chills.  He has no nausea, vomiting, diarrhea or constipation.  He has no headache or visual changes.  The patient is here today for evaluation before starting day 1 of cycle #2  MEDICAL HISTORY: Past Medical History:  Diagnosis Date   ETOH abuse    Lymphadenopathy    left   Marijuana abuse     ALLERGIES:  has No Known Allergies.  MEDICATIONS:  Current Outpatient Medications  Medication Sig Dispense Refill   acetaminophen (TYLENOL) 500 MG tablet Take 1,000 mg by mouth every 6 (six) hours as needed.     allopurinol (ZYLOPRIM) 100 MG tablet Take 1 tablet (100 mg total) by mouth 2 (two) times daily. 60 tablet 2   ibuprofen (ADVIL) 800 MG tablet Take 800 mg by mouth every 8 (eight) hours as needed.     lidocaine-prilocaine (EMLA) cream Apply 1  application topically as needed. Apply to port site  45 minutes prior to port being accessed 30 g 1   oxyCODONE-acetaminophen (PERCOCET/ROXICET) 5-325 MG tablet Take 1 tablet by mouth every 8 (eight) hours as needed for severe pain. 30 tablet 0   prochlorperazine (COMPAZINE) 10 MG tablet Take 1 tablet (10 mg total) by mouth every 8 (eight) hours as needed for nausea or vomiting. 30 tablet 2   No current facility-administered medications for this visit.    Facility-Administered Medications Ordered in Other Visits  Medication Dose Route Frequency Provider Last Rate Last Dose   sodium chloride flush (NS) 0.9 % injection 10 mL  10 mL Intravenous PRN Curt Bears, MD   10 mL at 06/27/19 0956    SURGICAL HISTORY:  Past Surgical History:  Procedure Laterality Date   HERNIA REPAIR     as child   IR IMAGING GUIDED PORT INSERTION  05/29/2019   LYMPH NODE BIOPSY Left 05/07/2019   Procedure: LYMPH NODE BIOPSY;  Surgeon: Melida Quitter, MD;  Location: [facility];  Service: ENT;  Laterality: Left;    REVIEW OF SYSTEMS:  A comprehensive review of systems was negative except for: Constitutional: positive for fatigue Musculoskeletal: positive for back pain   PHYSICAL EXAMINATION: General appearance: alert, cooperative and no distress Head: Normocephalic, without obvious abnormality, atraumatic Neck: no adenopathy, no JVD, supple, symmetrical, trachea midline and thyroid not enlarged, symmetric, no tenderness/mass/nodules Lymph nodes: Cervical, supraclavicular, and axillary nodes normal. Resp: clear  to auscultation bilaterally Back: symmetric, no curvature. ROM normal. No CVA tenderness. Cardio: regular rate and rhythm, S1, S2 normal, no murmur, click, rub or gallop GI: soft, non-tender; bowel sounds normal; no masses,  no organomegaly Extremities: extremities normal, atraumatic, no cyanosis or edema  ECOG PERFORMANCE STATUS: 1 - Symptomatic but completely ambulatory  Blood  pressure 121/90, pulse 87, temperature 98.3 F (36.8 C), temperature source Temporal, resp. rate 18, height 5\' 6"  (1.676 m), weight 136 lb 9.6 oz (62 kg), SpO2 100 %.  LABORATORY DATA: Lab Results  Component Value Date   WBC 5.1 06/27/2019   HGB 11.3 (L) 06/27/2019   HCT 34.2 (L) 06/27/2019   MCV 87.9 06/27/2019   PLT 180 06/27/2019      Chemistry      Component Value Date/Time   NA 139 06/20/2019 1427   K 3.3 (L) 06/20/2019 1427   CL 104 06/20/2019 1427   CO2 23 06/20/2019 1427   BUN 23 (H) 06/20/2019 1427   CREATININE 0.88 06/20/2019 1427      Component Value Date/Time   CALCIUM 9.1 06/20/2019 1427   ALKPHOS 247 (H) 06/20/2019 1427   AST 29 06/20/2019 1427   ALT 49 (H) 06/20/2019 1427   BILITOT 0.3 06/20/2019 1427       RADIOGRAPHIC STUDIES: Ir Imaging Guided Port Insertion  Result Date: 05/29/2019 INDICATION: 53 year old with Hodgkin lymphoma.  Port-A-Cath needed for therapy. EXAM: FLUOROSCOPIC AND ULTRASOUND GUIDED PLACEMENT OF A SUBCUTANEOUS PORT COMPARISON:  None. MEDICATIONS: Ancef 2 g; The antibiotic was administered within an appropriate time interval prior to skin puncture. ANESTHESIA/SEDATION: Versed 4.0 mg IV; Fentanyl 100 mcg IV; Moderate Sedation Time:  29 minutes The patient was continuously monitored during the procedure by the interventional radiology nurse under my direct supervision. FLUOROSCOPY TIME:  30 seconds, 3 mGy COMPLICATIONS: None immediate. PROCEDURE: The procedure, risks, benefits, and alternatives were explained to the patient. Questions regarding the procedure were encouraged and answered. The patient understands and consents to the procedure. Patient was placed supine on the interventional table. Ultrasound confirmed a patent right internal jugular vein. Ultrasound image saved for documentation. The right chest and neck were cleaned with a skin antiseptic and a sterile drape was placed. Maximal barrier sterile technique was utilized including  caps, mask, sterile gowns, sterile gloves, sterile drape, hand hygiene and skin antiseptic. The right neck was anesthetized with 1% lidocaine. Small incision was made in the right neck with a blade. Micropuncture set was placed in the right internal jugular vein with ultrasound guidance. The micropuncture wire was used for measurement purposes. The right chest was anesthetized with 1% lidocaine with epinephrine. #15 blade was used to make an incision and a subcutaneous port pocket was formed. [place] was assembled. Subcutaneous tunnel was formed with a stiff tunneling device. The port catheter was brought through the subcutaneous tunnel. The port was placed in the subcutaneous pocket. The micropuncture set was exchanged for a peel-away sheath. The catheter was placed through the peel-away sheath and the tip was positioned at the SVC and right atrium junction. Catheter placement was confirmed with fluoroscopy. The port was accessed and flushed with heparinized saline. The port pocket was closed using two layers of absorbable sutures and Dermabond. The vein skin site was closed using a single layer of absorbable suture and Dermabond. Sterile dressings were applied. Patient tolerated the procedure well without an immediate complication. Ultrasound and fluoroscopic images were taken and saved for this procedure. IMPRESSION: Placement of a subcutaneous  port device. Catheter tip at the SVC and right atrium junction. Electronically Signed   By: Markus Daft M.D.   On: 05/29/2019 13:28    ASSESSMENT AND PLAN: This is a very pleasant 53 years old African-American male recently diagnosed with a stage IV non-Hodgkin lymphoma, nodular sclerosing subtype.  The patient is currently undergoing systemic chemotherapy with brentuximab Vedotin in addition to doxorubicin, vinblastine and dacarbazine.  Status post 1 cycle.  He tolerated the first cycle of his treatment fairly well. The patient is feeling fine today with no  concerning complaints. I recommended for the patient to proceed with day 1 of cycle# 2. The patient will come back for follow-up visit in 2 weeks for evaluation before the next cycle of his treatment. He was advised to call immediately if he has any concerning symptoms in the interval. The patient voices understanding of current disease status and treatment options and is in agreement with the current care plan. All questions were answered. The patient knows to call the clinic with any problems, questions or concerns. We can certainly see the patient much sooner if necessary.  I spent 10 minutes counseling the patient face to face. The total time spent in the appointment was 15 minutes.  Disclaimer: This note was dictated with voice recognition software. Similar sounding words can inadvertently be transcribed and may not be corrected upon review.

## 2019-06-27 NOTE — Telephone Encounter (Signed)
Scheduled appt per 11/11 sch message - pt is aware of appt added

## 2019-06-27 NOTE — Patient Instructions (Signed)
[facility] Discharge Instructions for Patients Receiving Chemotherapy  Today you received the following chemotherapy agents Doxorubicin (ADRIAMYCIN), Vinblastine (VELBAN), Dacarbazine (DTIC) & Brentuximab (ADECETRIS).  To help prevent nausea and vomiting after your treatment, we encourage you to take your nausea medication as prescribed.  If you develop nausea and vomiting that is not controlled by your nausea medication, call the clinic.   BELOW ARE SYMPTOMS THAT SHOULD BE REPORTED IMMEDIATELY:  *FEVER GREATER THAN 100.5 F  *CHILLS WITH OR WITHOUT FEVER  NAUSEA AND VOMITING THAT IS NOT CONTROLLED WITH YOUR NAUSEA MEDICATION  *UNUSUAL SHORTNESS OF BREATH  *UNUSUAL BRUISING OR BLEEDING  TENDERNESS IN MOUTH AND THROAT WITH OR WITHOUT PRESENCE OF ULCERS  *URINARY PROBLEMS  *BOWEL PROBLEMS  UNUSUAL RASH Items with * indicate a potential emergency and should be followed up as soon as possible.  Feel free to call the clinic should you have any questions or concerns. The clinic phone number is [phone].  Please show the [facility] at check-in to the Emergency Department and triage nurse.  Doxorubicin injection What is this medicine? DOXORUBICIN (dox oh ROO bi sin) is a chemotherapy drug. It is used to treat many kinds of cancer like leukemia, lymphoma, neuroblastoma, sarcoma, and Wilms' tumor. It is also used to treat bladder cancer, breast cancer, lung cancer, ovarian cancer, stomach cancer, and thyroid cancer. This medicine may be used for other purposes; ask your health care provider or pharmacist if you have questions. COMMON BRAND NAME(S): Adriamycin, Adriamycin PFS, Adriamycin RDF, Rubex What should I tell my health care provider before I take this medicine? They need to know if you have any of these conditions:  heart disease  history of low blood counts caused by a medicine  liver disease  recent or ongoing radiation therapy  an unusual or  allergic reaction to doxorubicin, other chemotherapy agents, other medicines, foods, dyes, or preservatives  pregnant or trying to get pregnant  breast-feeding How should I use this medicine? This drug is given as an infusion into a vein. It is administered in a hospital or clinic by a specially trained health care professional. If you have pain, swelling, burning or any unusual feeling around the site of your injection, tell your health care professional right away. Talk to your pediatrician regarding the use of this medicine in children. Special care may be needed. Overdosage: If you think you have taken too much of this medicine contact a poison control center or emergency room at once. NOTE: This medicine is only for you. Do not share this medicine with others. What if I miss a dose? It is important not to miss your dose. Call your doctor or health care professional if you are unable to keep an appointment. What may interact with this medicine? This medicine may interact with the following medications:  6-mercaptopurine  paclitaxel  phenytoin  St. John's Wort  trastuzumab  verapamil This list may not describe all possible interactions. Give your health care provider a list of all the medicines, herbs, non-prescription drugs, or dietary supplements you use. Also tell them if you smoke, drink alcohol, or use illegal drugs. Some items may interact with your medicine. What should I watch for while using this medicine? This drug may make you feel generally unwell. This is not uncommon, as chemotherapy can affect healthy cells as well as cancer cells. Report any side effects. Continue your course of treatment even though you feel ill unless your doctor tells you to stop.  There is a maximum amount of this medicine you should receive throughout your life. The amount depends on the medical condition being treated and your overall health. Your doctor will watch how much of this medicine you  receive in your lifetime. Tell your doctor if you have taken this medicine before. You may need blood work done while you are taking this medicine. Your urine may turn red for a few days after your dose. This is not blood. If your urine is dark or brown, call your doctor. In some cases, you may be given additional medicines to help with side effects. Follow all directions for their use. Call your doctor or health care professional for advice if you get a fever, chills or sore throat, or other symptoms of a cold or flu. Do not treat yourself. This drug decreases your body's ability to fight infections. Try to avoid being around people who are sick. This medicine may increase your risk to bruise or bleed. Call your doctor or health care professional if you notice any unusual bleeding. Talk to your doctor about your risk of cancer. You may be more at risk for certain types of cancers if you take this medicine. Do not become pregnant while taking this medicine or for 6 months after stopping it. Women should inform their doctor if they wish to become pregnant or think they might be pregnant. Men should not father a child while taking this medicine and for 6 months after stopping it. There is a potential for serious side effects to an unborn child. Talk to your health care professional or pharmacist for more information. Do not breast-feed an infant while taking this medicine. This medicine has caused ovarian failure in some women and reduced sperm counts in some men This medicine may interfere with the ability to have a child. Talk with your doctor or health care professional if you are concerned about your fertility. This medicine may cause a decrease in Co-Enzyme Q-10. You should make sure that you get enough Co-Enzyme Q-10 while you are taking this medicine. Discuss the foods you eat and the vitamins you take with your health care professional. What side effects may I notice from receiving this  medicine? Side effects that you should report to your doctor or health care professional as soon as possible:  allergic reactions like skin rash, itching or hives, swelling of the face, lips, or tongue  breathing problems  chest pain  fast or irregular heartbeat  low blood counts - this medicine may decrease the number of white blood cells, red blood cells and platelets. You may be at increased risk for infections and bleeding.  pain, redness, or irritation at site where injected  signs of infection - fever or chills, cough, sore throat, pain or difficulty passing urine  signs of decreased platelets or bleeding - bruising, pinpoint red spots on the skin, black, tarry stools, blood in the urine  swelling of the ankles, feet, hands  tiredness  weakness Side effects that usually do not require medical attention (report to your doctor or health care professional if they continue or are bothersome):  diarrhea  hair loss  mouth sores  nail discoloration or damage  nausea  red colored urine  vomiting This list may not describe all possible side effects. Call your doctor for medical advice about side effects. You may report side effects to FDA at 1-800-FDA-1088. Where should I keep my medicine? This drug is given in a hospital or clinic and will  not be stored at home. NOTE: This sheet is a summary. It may not cover all possible information. If you have questions about this medicine, talk to your doctor, pharmacist, or health care provider.  2020 Elsevier/Gold Standard (2017-03-16 11:01:26)  Vinblastine injection What is this medicine? VINBLASTINE (vin BLAS teen) is a chemotherapy drug. It slows the growth of cancer cells. This medicine is used to treat many types of cancer like breast cancer, testicular cancer, Hodgkin's disease, non-Hodgkin's lymphoma, and sarcoma. This medicine may be used for other purposes; ask your health care provider or pharmacist if you have  questions. COMMON BRAND NAME(S): Velban What should I tell my health care provider before I take this medicine? They need to know if you have any of these conditions:  blood disorders  dental disease  gout  infection (especially a virus infection such as chickenpox, cold sores, or herpes)  liver disease  lung disease  nervous system disease  recent or ongoing radiation therapy  an unusual or allergic reaction to vinblastine, other chemotherapy agents, other medicines, foods, dyes, or preservatives  pregnant or trying to get pregnant  breast-feeding How should I use this medicine? This drug is given as an infusion into a vein. It is administered in a hospital or clinic by a specially trained health care professional. If you have pain, swelling, burning or any unusual feeling around the site of your injection, tell your health care professional right away. Talk to your pediatrician regarding the use of this medicine in children. While this drug may be prescribed for selected conditions, precautions do apply. Overdosage: If you think you have taken too much of this medicine contact a poison control center or emergency room at once. NOTE: This medicine is only for you. Do not share this medicine with others. What if I miss a dose? It is important not to miss your dose. Call your doctor or health care professional if you are unable to keep an appointment. What may interact with this medicine? Do not take this medicine with any of the following medications:  erythromycin  itraconazole  mibefradil  voriconazole This medicine may also interact with the following medications:  cyclosporine  fluconazole  ketoconazole  medicines for seizures like phenytoin  medicines to increase blood counts like filgrastim, pegfilgrastim, sargramostim  vaccines  verapamil Talk to your doctor or health care professional before taking any of these  medicines:  acetaminophen  aspirin  ibuprofen  ketoprofen  naproxen This list may not describe all possible interactions. Give your health care provider a list of all the medicines, herbs, non-prescription drugs, or dietary supplements you use. Also tell them if you smoke, drink alcohol, or use illegal drugs. Some items may interact with your medicine. What should I watch for while using this medicine? Your condition will be monitored carefully while you are receiving this medicine. You will need important blood work done while you are taking this medicine. This drug may make you feel generally unwell. This is not uncommon, as chemotherapy can affect healthy cells as well as cancer cells. Report any side effects. Continue your course of treatment even though you feel ill unless your doctor tells you to stop. In some cases, you may be given additional medicines to help with side effects. Follow all directions for their use. Call your doctor or health care professional for advice if you get a fever, chills or sore throat, or other symptoms of a cold or flu. Do not treat yourself. This drug decreases your  body's ability to fight infections. Try to avoid being around people who are sick. This medicine may increase your risk to bruise or bleed. Call your doctor or health care professional if you notice any unusual bleeding. Be careful brushing and flossing your teeth or using a toothpick because you may get an infection or bleed more easily. If you have any dental work done, tell your dentist you are receiving this medicine. Avoid taking products that contain aspirin, acetaminophen, ibuprofen, naproxen, or ketoprofen unless instructed by your doctor. These medicines may hide a fever. Do not become pregnant while taking this medicine. Women should inform their doctor if they wish to become pregnant or think they might be pregnant. There is a potential for serious side effects to an unborn child. Talk  to your health care professional or pharmacist for more information. Do not breast-feed an infant while taking this medicine. Men may have a lower sperm count while taking this medicine. Talk to your doctor if you plan to father a child. What side effects may I notice from receiving this medicine? Side effects that you should report to your doctor or health care professional as soon as possible:  allergic reactions like skin rash, itching or hives, swelling of the face, lips, or tongue  low blood counts - This drug may decrease the number of white blood cells, red blood cells and platelets. You may be at increased risk for infections and bleeding.  signs of infection - fever or chills, cough, sore throat, pain or difficulty passing urine  signs of decreased platelets or bleeding - bruising, pinpoint red spots on the skin, black, tarry stools, nosebleeds  signs of decreased red blood cells - unusually weak or tired, fainting spells, lightheadedness  breathing problems  changes in hearing  change in the amount of urine  chest pain  high blood pressure  mouth sores  nausea and vomiting  pain, swelling, redness or irritation at the injection site  pain, tingling, numbness in the hands or feet  problems with balance, dizziness  seizures Side effects that usually do not require medical attention (report to your doctor or health care professional if they continue or are bothersome):  constipation  hair loss  jaw pain  loss of appetite  sensitivity to light  stomach pain  tumor pain This list may not describe all possible side effects. Call your doctor for medical advice about side effects. You may report side effects to FDA at 1-800-FDA-1088. Where should I keep my medicine? This drug is given in a hospital or clinic and will not be stored at home. NOTE: This sheet is a summary. It may not cover all possible information. If you have questions about this medicine, talk  to your doctor, pharmacist, or health care provider.  2020 Elsevier/Gold Standard (2008-04-29 17:15:59)  Dacarbazine, DTIC injection What is this medicine? DACARBAZINE (da KAR ba zeen) is a chemotherapy drug. This medicine is used to treat skin cancer. It is also used with other medicines to treat Hodgkin's disease. This medicine may be used for other purposes; ask your health care provider or pharmacist if you have questions. COMMON BRAND NAME(S): DTIC-Dome What should I tell my health care provider before I take this medicine? They need to know if you have any of these conditions:  infection (especially virus infection such as chickenpox, cold sores, or herpes)  kidney disease  liver disease  low blood counts like low platelets, red blood cells, white blood cells  recent radiation therapy  an unusual or allergic reaction to dacarbazine, other chemotherapy agents, other medicines, foods, dyes, or preservatives  pregnant or trying to get pregnant  breast-feeding How should I use this medicine? This drug is given as an injection or infusion into a vein. It is administered in a hospital or clinic by a specially trained health care professional. Talk to your pediatrician regarding the use of this medicine in children. While this drug may be prescribed for selected conditions, precautions do apply. Overdosage: If you think you have taken too much of this medicine contact a poison control center or emergency room at once. NOTE: This medicine is only for you. Do not share this medicine with others. What if I miss a dose? It is important not to miss your dose. Call your doctor or health care professional if you are unable to keep an appointment. What may interact with this medicine?  medicines to increase blood counts like filgrastim, pegfilgrastim, sargramostim  vaccines This list may not describe all possible interactions. Give your health care provider a list of all the medicines,  herbs, non-prescription drugs, or dietary supplements you use. Also tell them if you smoke, drink alcohol, or use illegal drugs. Some items may interact with your medicine. What should I watch for while using this medicine? Your condition will be monitored carefully while you are receiving this medicine. You will need important blood work done while you are taking this medicine. This drug may make you feel generally unwell. This is not uncommon, as chemotherapy can affect healthy cells as well as cancer cells. Report any side effects. Continue your course of treatment even though you feel ill unless your doctor tells you to stop. Call your doctor or health care professional for advice if you get a fever, chills or sore throat, or other symptoms of a cold or flu. Do not treat yourself. This drug decreases your body's ability to fight infections. Try to avoid being around people who are sick. This medicine may increase your risk to bruise or bleed. Call your doctor or health care professional if you notice any unusual bleeding. Talk to your doctor about your risk of cancer. You may be more at risk for certain types of cancers if you take this medicine. Do not become pregnant while taking this medicine. Women should inform their doctor if they wish to become pregnant or think they might be pregnant. There is a potential for serious side effects to an unborn child. Talk to your health care professional or pharmacist for more information. Do not breast-feed an infant while taking this medicine. What side effects may I notice from receiving this medicine? Side effects that you should report to your doctor or health care professional as soon as possible:  allergic reactions like skin rash, itching or hives, swelling of the face, lips, or tongue  low blood counts - this medicine may decrease the number of white blood cells, red blood cells and platelets. You may be at increased risk for infections and  bleeding.  signs of infection - fever or chills, cough, sore throat, pain or difficulty passing urine  signs of decreased platelets or bleeding - bruising, pinpoint red spots on the skin, black, tarry stools, blood in the urine  signs of decreased red blood cells - unusually weak or tired, fainting spells, lightheadedness  breathing problems  muscle pains  pain at site where injected  trouble passing urine or change in the amount of urine  vomiting  yellowing of the eyes  or skin Side effects that usually do not require medical attention (report to your doctor or health care professional if they continue or are bothersome):  diarrhea  hair loss  loss of appetite  nausea  skin more sensitive to sun or ultraviolet light  stomach upset This list may not describe all possible side effects. Call your doctor for medical advice about side effects. You may report side effects to FDA at 1-800-FDA-1088. Where should I keep my medicine? This drug is given in a hospital or clinic and will not be stored at home. NOTE: This sheet is a summary. It may not cover all possible information. If you have questions about this medicine, talk to your doctor, pharmacist, or health care provider.  2020 Elsevier/Gold Standard (2015-10-03 15:17:39)  Brentuximab vedotin solution for injection What is this medicine? BRENTUXIMAB VEDOTIN (bren TUX see mab ve DOE tin) is a monoclonal antibody and a chemotherapy drug. It is used for treating Hodgkin lymphoma and certain non-Hodgkin lymphomas, such as anaplastic large-cell lymphoma, mycosis fungoides, and peripheral T-cell lymphoma. This medicine may be used for other purposes; ask your health care provider or pharmacist if you have questions. COMMON BRAND NAME(S): ADCETRIS What should I tell my health care provider before I take this medicine? They need to know if you have any of these conditions:  immune system problems  infection (especially a virus  infection such as chickenpox, cold sores, or herpes)  kidney disease  liver disease  low blood counts, like low white cell, platelet, or red cell counts  tingling of the fingers or toes, or other nerve disorder  an unusual or allergic reaction to brentuximab vedotin, other medicines, foods, dyes, or preservatives  pregnant or trying to get pregnant  breast-feeding How should I use this medicine? This medicine is for infusion into a vein. It is given by a health care professional in a hospital or clinic setting. Talk to your pediatrician regarding the use of this medicine in children. Special care may be needed. Overdosage: If you think you have taken too much of this medicine contact a poison control center or emergency room at once. NOTE: This medicine is only for you. Do not share this medicine with others. What if I miss a dose? It is important not to miss your dose. Call your doctor or health care professional if you are unable to keep an appointment. What may interact with this medicine? This medicine may interact with the following medications:  ketoconazole  rifampin  St. John's wort; Hypericum perforatum This list may not describe all possible interactions. Give your health care provider a list of all the medicines, herbs, non-prescription drugs, or dietary supplements you use. Also tell them if you smoke, drink alcohol, or use illegal drugs. Some items may interact with your medicine. What should I watch for while using this medicine? Visit your doctor for checks on your progress. This drug may make you feel generally unwell. Report any side effects. Continue your course of treatment even though you feel ill unless your doctor tells you to stop. Call your doctor or health care professional for advice if you get a fever, chills or sore throat, or other symptoms of a cold or flu. Do not treat yourself. This drug decreases your body's ability to fight infections. Try to avoid  being around people who are sick. This medicine may increase your risk to bruise or bleed. Call your doctor or health care professional if you notice any unusual bleeding. In some  patients, this medicine may cause a serious brain infection that may cause death. If you have any problems seeing, thinking, speaking, walking, or standing, tell your doctor right away. If you cannot reach your doctor, urgently seek other source of medical care. Do not become pregnant while taking this medicine or for 6 months after stopping it. Women should inform their doctor if they wish to become pregnant or think they might be pregnant. Men should not father a child while taking this medicine and for 6 months after stopping it. There is a potential for serious side effects to an unborn child. Talk to your health care professional or pharmacist for more information. Do not breast-feed an infant while taking this medicine. This may interfere with the ability to father a child. You should talk to your doctor or health care professional if you are concerned about your fertility. What side effects may I notice from receiving this medicine? Side effects that you should report to your doctor or health care professional as soon as possible:  allergic reactions like skin rash, itching or hives, swelling of the face, lips, or tongue  changes in emotions or moods  diarrhea  low blood counts - this medicine may decrease the number of white blood cells, red blood cells and platelets. You may be at increased risk for infections and bleeding.  pain, tingling, numbness in the hands or feet  redness, blistering, peeling or loosening of the skin, including inside the mouth  shortness of breath  signs of infection - fever or chills, cough, sore throat, pain or difficulty passing urine  signs of decreased platelets or bleeding - bruising, pinpoint red spots on the skin, black, tarry stools, blood in the urine  signs of decreased  red blood cells - unusually weak or tired, fainting spells, lightheadedness  signs of liver injury like dark yellow or brown urine; general ill feeling or flu-like symptoms; light-colored stools; loss of appetite; nausea; right upper belly pain; yellowing of the eyes or skin  stomach pain  sudden numbness or weakness of the face, arm or leg  vomiting Side effects that usually do not require medical attention (report to your doctor or health care professional if they continue or are bothersome):  constipation  dizziness  headache  muscle pain  tiredness This list may not describe all possible side effects. Call your doctor for medical advice about side effects. You may report side effects to FDA at 1-800-FDA-1088. Where should I keep my medicine? This drug is given in a hospital or clinic and will not be stored at home. NOTE: This sheet is a summary. It may not cover all possible information. If you have questions about this medicine, talk to your doctor, pharmacist, or health care provider.  2020 [employer]/Gold Standard (2017-07-04 14:10:02)  Coronavirus (COVID-19) Are you at risk?  Are you at risk for the Coronavirus (COVID-19)?  To be considered HIGH RISK for Coronavirus (COVID-19), you have to meet the following criteria:  . Traveled to [place], [place], [place], [place] or [place]; or in the [place] to [place], [place], [place], or [place]; and have fever, cough, and shortness of breath within the last 2 weeks of travel OR . Been in close contact with a person diagnosed with COVID-19 within the last 2 weeks and have fever, cough, and shortness of breath . IF YOU DO NOT MEET THESE CRITERIA, YOU ARE CONSIDERED LOW RISK FOR COVID-19.  What to do if you are HIGH RISK for  COVID-19?  Marland Kitchen If you are having a medical emergency, call 911. . Seek medical care right away. Before you go to a doctor's office, urgent care or emergency department, call ahead and tell them about  your recent travel, contact with someone diagnosed with COVID-19, and your symptoms. You should receive instructions from your physician's office regarding next steps of care.  . When you arrive at healthcare provider, tell the healthcare staff immediately you have returned from visiting [place], [place], [place], [place] or [place]; or traveled in the [place] to [place], [place], [place], or [place]; in the last two weeks or you have been in close contact with a person diagnosed with COVID-19 in the last 2 weeks.   . Tell the health care staff about your symptoms: fever, cough and shortness of breath. . After you have been seen by a medical provider, you will be either: o Tested for (COVID-19) and discharged home on quarantine except to seek medical care if symptoms worsen, and asked to  - Stay home and avoid contact with others until you get your results (4-5 days)  - Avoid travel on public transportation if possible (such as bus, train, or airplane) or o Sent to the Emergency Department by EMS for evaluation, COVID-19 testing, and possible admission depending on your condition and test results.  What to do if you are LOW RISK for COVID-19?  Reduce your risk of any infection by using the same precautions used for avoiding the common cold or flu:  Marland Kitchen Wash your hands often with soap and warm water for at least 20 seconds.  If soap and water are not readily available, use an alcohol-based hand sanitizer with at least 60% alcohol.  . If coughing or sneezing, cover your mouth and nose by coughing or sneezing into the elbow areas of your shirt or coat, into a tissue or into your sleeve (not your hands). . Avoid shaking hands with others and consider head nods or verbal greetings only. . Avoid touching your eyes, nose, or mouth with unwashed hands.  . Avoid close contact with people who are sick. . Avoid places or events with large numbers of people in one location, like concerts or sporting  events. . Carefully consider travel plans you have or are making. . If you are planning any travel outside or inside the [place], visit the CDC's Travelers' Health webpage for the latest health notices. . If you have some symptoms but not all symptoms, continue to monitor at home and seek medical attention if your symptoms worsen. . If you are having a medical emergency, call 911.   [place] / e-Visit: [email or web address]         [facility]: [facility]: [id number]                   [facility]: [phone]

## 2019-06-29 ENCOUNTER — Inpatient Hospital Stay: Payer: Medicaid Other

## 2019-06-29 ENCOUNTER — Other Ambulatory Visit: Payer: Self-pay

## 2019-06-29 VITALS — BP 128/68 | HR 78 | Temp 98.2°F | Resp 18

## 2019-06-29 DIAGNOSIS — C8118 Nodular sclerosis classical Hodgkin lymphoma, lymph nodes of multiple sites: Secondary | ICD-10-CM

## 2019-06-29 DIAGNOSIS — Z5112 Encounter for antineoplastic immunotherapy: Secondary | ICD-10-CM | POA: Diagnosis not present

## 2019-06-29 MED ORDER — PEGFILGRASTIM-CBQV 6 MG/0.6ML ~~LOC~~ SOSY
6.0000 mg | PREFILLED_SYRINGE | Freq: Once | SUBCUTANEOUS | Status: AC
  Administered 2019-06-29: 6 mg via SUBCUTANEOUS

## 2019-06-29 MED ORDER — PEGFILGRASTIM-CBQV 6 MG/0.6ML ~~LOC~~ SOSY
PREFILLED_SYRINGE | SUBCUTANEOUS | Status: AC
  Filled 2019-06-29: qty 0.6

## 2019-06-29 NOTE — Patient Instructions (Signed)

## 2019-07-04 ENCOUNTER — Inpatient Hospital Stay: Payer: Medicaid Other

## 2019-07-04 ENCOUNTER — Other Ambulatory Visit: Payer: Self-pay

## 2019-07-04 DIAGNOSIS — Z5112 Encounter for antineoplastic immunotherapy: Secondary | ICD-10-CM | POA: Diagnosis not present

## 2019-07-04 DIAGNOSIS — C8118 Nodular sclerosis classical Hodgkin lymphoma, lymph nodes of multiple sites: Secondary | ICD-10-CM

## 2019-07-04 LAB — CMP (CANCER CENTER ONLY)
ALT: 17 U/L (ref 0–44)
AST: 15 U/L (ref 15–41)
Albumin: 3.6 g/dL (ref 3.5–5.0)
Alkaline Phosphatase: 175 U/L — ABNORMAL HIGH (ref 38–126)
Anion gap: 11 (ref 5–15)
BUN: 13 mg/dL (ref 6–20)
CO2: 24 mmol/L (ref 22–32)
Calcium: 8.9 mg/dL (ref 8.9–10.3)
Chloride: 103 mmol/L (ref 98–111)
Creatinine: 0.8 mg/dL (ref 0.61–1.24)
GFR, Est AFR Am: >60 mL/min (ref >60)
GFR, Estimated: >60 mL/min (ref >60)
Glucose, Bld: 67 mg/dL — ABNORMAL LOW (ref 70–99)
Potassium: 3.7 mmol/L (ref 3.5–5.1)
Sodium: 138 mmol/L (ref 135–145)
Total Bilirubin: 0.3 mg/dL (ref 0.3–1.2)
Total Protein: 7 g/dL (ref 6.5–8.1)

## 2019-07-04 LAB — CBC WITH DIFFERENTIAL (CANCER CENTER ONLY)
Abs Immature Granulocytes: 0.4 10*3/uL — ABNORMAL HIGH (ref 0.00–0.07)
Basophils Absolute: 0.1 10*3/uL (ref 0.0–0.1)
Basophils Relative: 0 %
Eosinophils Absolute: 0 10*3/uL (ref 0.0–0.5)
Eosinophils Relative: 0 %
HCT: 30.5 % — ABNORMAL LOW (ref 39.0–52.0)
Hemoglobin: 10.2 g/dL — ABNORMAL LOW (ref 13.0–17.0)
Immature Granulocytes: 3 %
Lymphocytes Relative: 10 %
Lymphs Abs: 1.6 10*3/uL (ref 0.7–4.0)
MCH: 30.1 pg (ref 26.0–34.0)
MCHC: 33.4 g/dL (ref 30.0–36.0)
MCV: 90 fL (ref 80.0–100.0)
Monocytes Absolute: 0.7 10*3/uL (ref 0.1–1.0)
Monocytes Relative: 4 %
Neutro Abs: 13.2 10*3/uL — ABNORMAL HIGH (ref 1.7–7.7)
Neutrophils Relative %: 83 %
Platelet Count: 382 10*3/uL (ref 150–400)
RBC: 3.39 MIL/uL — ABNORMAL LOW (ref 4.22–5.81)
RDW: 16.1 % — ABNORMAL HIGH (ref 11.5–15.5)
WBC Count: 15.9 10*3/uL — ABNORMAL HIGH (ref 4.0–10.5)
nRBC: 0 % (ref 0.0–0.2)

## 2019-07-04 LAB — LACTATE DEHYDROGENASE: LDH: 278 U/L — ABNORMAL HIGH (ref 98–192)

## 2019-07-05 ENCOUNTER — Telehealth: Payer: Self-pay | Admitting: *Deleted

## 2019-07-05 NOTE — Telephone Encounter (Signed)
Jesse [name] states his abdomen is swollen today. Has not had a "Good BM " in 3-4 days, only small amounts. Has not used any stool softeners. Suggested that he get miralax and colace. To call us tomorrow to let su know how he is doing.

## 2019-07-11 ENCOUNTER — Inpatient Hospital Stay: Payer: Medicaid Other

## 2019-07-11 ENCOUNTER — Other Ambulatory Visit: Payer: Self-pay | Admitting: Physician Assistant

## 2019-07-11 ENCOUNTER — Other Ambulatory Visit: Payer: Self-pay

## 2019-07-11 VITALS — BP 118/74 | HR 69 | Temp 98.3°F | Resp 17 | Ht 66.0 in | Wt 135.8 lb

## 2019-07-11 DIAGNOSIS — G893 Neoplasm related pain (acute) (chronic): Secondary | ICD-10-CM

## 2019-07-11 DIAGNOSIS — C8118 Nodular sclerosis classical Hodgkin lymphoma, lymph nodes of multiple sites: Secondary | ICD-10-CM

## 2019-07-11 DIAGNOSIS — Z5112 Encounter for antineoplastic immunotherapy: Secondary | ICD-10-CM | POA: Diagnosis not present

## 2019-07-11 LAB — CMP (CANCER CENTER ONLY)
ALT: 18 U/L (ref 0–44)
AST: 14 U/L — ABNORMAL LOW (ref 15–41)
Albumin: 3.9 g/dL (ref 3.5–5.0)
Alkaline Phosphatase: 142 U/L — ABNORMAL HIGH (ref 38–126)
Anion gap: 11 (ref 5–15)
BUN: 14 mg/dL (ref 6–20)
CO2: 25 mmol/L (ref 22–32)
Calcium: 9 mg/dL (ref 8.9–10.3)
Chloride: 101 mmol/L (ref 98–111)
Creatinine: 0.87 mg/dL (ref 0.61–1.24)
GFR, Est AFR Am: >60 mL/min (ref >60)
GFR, Estimated: >60 mL/min (ref >60)
Glucose, Bld: 98 mg/dL (ref 70–99)
Potassium: 4 mmol/L (ref 3.5–5.1)
Sodium: 137 mmol/L (ref 135–145)
Total Bilirubin: 0.3 mg/dL (ref 0.3–1.2)
Total Protein: 7.7 g/dL (ref 6.5–8.1)

## 2019-07-11 LAB — CBC WITH DIFFERENTIAL (CANCER CENTER ONLY)
Abs Immature Granulocytes: 0.04 10*3/uL (ref 0.00–0.07)
Basophils Absolute: 0 10*3/uL (ref 0.0–0.1)
Basophils Relative: 1 %
Eosinophils Absolute: 0 10*3/uL (ref 0.0–0.5)
Eosinophils Relative: 1 %
HCT: 35.7 % — ABNORMAL LOW (ref 39.0–52.0)
Hemoglobin: 11.6 g/dL — ABNORMAL LOW (ref 13.0–17.0)
Immature Granulocytes: 1 %
Lymphocytes Relative: 21 %
Lymphs Abs: 1.3 10*3/uL (ref 0.7–4.0)
MCH: 29.6 pg (ref 26.0–34.0)
MCHC: 32.5 g/dL (ref 30.0–36.0)
MCV: 91.1 fL (ref 80.0–100.0)
Monocytes Absolute: 1.1 10*3/uL — ABNORMAL HIGH (ref 0.1–1.0)
Monocytes Relative: 17 %
Neutro Abs: 3.9 10*3/uL (ref 1.7–7.7)
Neutrophils Relative %: 59 %
Platelet Count: 349 10*3/uL (ref 150–400)
RBC: 3.92 MIL/uL — ABNORMAL LOW (ref 4.22–5.81)
RDW: 17.1 % — ABNORMAL HIGH (ref 11.5–15.5)
WBC Count: 6.4 10*3/uL (ref 4.0–10.5)
nRBC: 0 % (ref 0.0–0.2)

## 2019-07-11 LAB — LACTATE DEHYDROGENASE: LDH: 153 U/L (ref 98–192)

## 2019-07-11 MED ORDER — SODIUM CHLORIDE 0.9 % IV SOLN
Freq: Once | INTRAVENOUS | Status: AC
  Administered 2019-07-11: 13:00:00 via INTRAVENOUS
  Filled 2019-07-11: qty 250

## 2019-07-11 MED ORDER — VINBLASTINE SULFATE CHEMO INJECTION 1 MG/ML
5.8000 mg/m2 | Freq: Once | INTRAVENOUS | Status: AC
  Administered 2019-07-11: 15:00:00 10 mg via INTRAVENOUS
  Filled 2019-07-11: qty 10

## 2019-07-11 MED ORDER — SODIUM CHLORIDE 0.9 % IV SOLN
Freq: Once | INTRAVENOUS | Status: AC
  Administered 2019-07-11: 13:00:00 via INTRAVENOUS
  Filled 2019-07-11: qty 5

## 2019-07-11 MED ORDER — ACETAMINOPHEN 325 MG PO TABS
650.0000 mg | ORAL_TABLET | Freq: Once | ORAL | Status: AC
  Administered 2019-07-11: 650 mg via ORAL

## 2019-07-11 MED ORDER — SODIUM CHLORIDE 0.9 % IV SOLN
1.2000 mg/kg | Freq: Once | INTRAVENOUS | Status: AC
  Administered 2019-07-11: 75 mg via INTRAVENOUS
  Filled 2019-07-11: qty 15

## 2019-07-11 MED ORDER — ACETAMINOPHEN 325 MG PO TABS
ORAL_TABLET | ORAL | Status: AC
  Filled 2019-07-11: qty 2

## 2019-07-11 MED ORDER — PALONOSETRON HCL INJECTION 0.25 MG/5ML
0.2500 mg | Freq: Once | INTRAVENOUS | Status: AC
  Administered 2019-07-11: 0.25 mg via INTRAVENOUS

## 2019-07-11 MED ORDER — DOXORUBICIN HCL CHEMO IV INJECTION 2 MG/ML
25.0000 mg/m2 | Freq: Once | INTRAVENOUS | Status: AC
  Administered 2019-07-11: 14:00:00 44 mg via INTRAVENOUS
  Filled 2019-07-11: qty 22

## 2019-07-11 MED ORDER — PALONOSETRON HCL INJECTION 0.25 MG/5ML
INTRAVENOUS | Status: AC
  Filled 2019-07-11: qty 5

## 2019-07-11 MED ORDER — OXYCODONE-ACETAMINOPHEN 5-325 MG PO TABS: 1.0000 | ORAL_TABLET | Freq: Three times a day (TID) | ORAL | 0 refills | Status: DC | PRN

## 2019-07-11 MED ORDER — SODIUM CHLORIDE 0.9 % IV SOLN
375.0000 mg/m2 | Freq: Once | INTRAVENOUS | Status: AC
  Administered 2019-07-11: 15:00:00 650 mg via INTRAVENOUS
  Filled 2019-07-11: qty 65

## 2019-07-11 MED ORDER — DIPHENHYDRAMINE HCL 50 MG/ML IJ SOLN
50.0000 mg | Freq: Once | INTRAMUSCULAR | Status: AC
  Administered 2019-07-11: 13:00:00 50 mg via INTRAVENOUS

## 2019-07-11 MED ORDER — HEPARIN SOD (PORK) LOCK FLUSH 100 UNIT/ML IV SOLN
500.0000 [IU] | Freq: Once | INTRAVENOUS | Status: AC | PRN
  Administered 2019-07-11: 500 [IU]
  Filled 2019-07-11: qty 5

## 2019-07-11 MED ORDER — SODIUM CHLORIDE 0.9% FLUSH
10.0000 mL | INTRAVENOUS | Status: DC | PRN
  Administered 2019-07-11: 10 mL
  Filled 2019-07-11: qty 10

## 2019-07-11 MED ORDER — DIPHENHYDRAMINE HCL 50 MG/ML IJ SOLN
INTRAMUSCULAR | Status: AC
  Filled 2019-07-11: qty 1

## 2019-07-11 MED FILL — OXYCODONE-ACETAMINOPHEN 5-3: 5-325 | 10 days supply | Qty: 30 | Fill #0

## 2019-07-11 NOTE — Patient Instructions (Signed)
[facility] Discharge Instructions for Patients Receiving Chemotherapy  Today you received the following chemotherapy agents Doxorubicin (ADRIAMYCIN), Vinblastine (VELBAN), Dacarbazine (DTIC) & Brentuximab (ADECETRIS).  To help prevent nausea and vomiting after your treatment, we encourage you to take your nausea medication as prescribed.  If you develop nausea and vomiting that is not controlled by your nausea medication, call the clinic.   BELOW ARE SYMPTOMS THAT SHOULD BE REPORTED IMMEDIATELY:  *FEVER GREATER THAN 100.5 F  *CHILLS WITH OR WITHOUT FEVER  NAUSEA AND VOMITING THAT IS NOT CONTROLLED WITH YOUR NAUSEA MEDICATION  *UNUSUAL SHORTNESS OF BREATH  *UNUSUAL BRUISING OR BLEEDING  TENDERNESS IN MOUTH AND THROAT WITH OR WITHOUT PRESENCE OF ULCERS  *URINARY PROBLEMS  *BOWEL PROBLEMS  UNUSUAL RASH Items with * indicate a potential emergency and should be followed up as soon as possible.  Feel free to call the clinic should you have any questions or concerns. The clinic phone number is [phone].  Please show the [facility] at check-in to the Emergency Department and triage nurse.  Doxorubicin injection What is this medicine? DOXORUBICIN (dox oh ROO bi sin) is a chemotherapy drug. It is used to treat many kinds of cancer like leukemia, lymphoma, neuroblastoma, sarcoma, and Wilms' tumor. It is also used to treat bladder cancer, breast cancer, lung cancer, ovarian cancer, stomach cancer, and thyroid cancer. This medicine may be used for other purposes; ask your health care provider or pharmacist if you have questions. COMMON BRAND NAME(S): Adriamycin, Adriamycin PFS, Adriamycin RDF, Rubex What should I tell my health care provider before I take this medicine? They need to know if you have any of these conditions:  heart disease  history of low blood counts caused by a medicine  liver disease  recent or ongoing radiation therapy  an unusual or  allergic reaction to doxorubicin, other chemotherapy agents, other medicines, foods, dyes, or preservatives  pregnant or trying to get pregnant  breast-feeding How should I use this medicine? This drug is given as an infusion into a vein. It is administered in a hospital or clinic by a specially trained health care professional. If you have pain, swelling, burning or any unusual feeling around the site of your injection, tell your health care professional right away. Talk to your pediatrician regarding the use of this medicine in children. Special care may be needed. Overdosage: If you think you have taken too much of this medicine contact a poison control center or emergency room at once. NOTE: This medicine is only for you. Do not share this medicine with others. What if I miss a dose? It is important not to miss your dose. Call your doctor or health care professional if you are unable to keep an appointment. What may interact with this medicine? This medicine may interact with the following medications:  6-mercaptopurine  paclitaxel  phenytoin  St. John's Wort  trastuzumab  verapamil This list may not describe all possible interactions. Give your health care provider a list of all the medicines, herbs, non-prescription drugs, or dietary supplements you use. Also tell them if you smoke, drink alcohol, or use illegal drugs. Some items may interact with your medicine. What should I watch for while using this medicine? This drug may make you feel generally unwell. This is not uncommon, as chemotherapy can affect healthy cells as well as cancer cells. Report any side effects. Continue your course of treatment even though you feel ill unless your doctor tells you to stop.  There is a maximum amount of this medicine you should receive throughout your life. The amount depends on the medical condition being treated and your overall health. Your doctor will watch how much of this medicine you  receive in your lifetime. Tell your doctor if you have taken this medicine before. You may need blood work done while you are taking this medicine. Your urine may turn red for a few days after your dose. This is not blood. If your urine is dark or brown, call your doctor. In some cases, you may be given additional medicines to help with side effects. Follow all directions for their use. Call your doctor or health care professional for advice if you get a fever, chills or sore throat, or other symptoms of a cold or flu. Do not treat yourself. This drug decreases your body's ability to fight infections. Try to avoid being around people who are sick. This medicine may increase your risk to bruise or bleed. Call your doctor or health care professional if you notice any unusual bleeding. Talk to your doctor about your risk of cancer. You may be more at risk for certain types of cancers if you take this medicine. Do not become pregnant while taking this medicine or for 6 months after stopping it. Women should inform their doctor if they wish to become pregnant or think they might be pregnant. Men should not father a child while taking this medicine and for 6 months after stopping it. There is a potential for serious side effects to an unborn child. Talk to your health care professional or pharmacist for more information. Do not breast-feed an infant while taking this medicine. This medicine has caused ovarian failure in some women and reduced sperm counts in some men This medicine may interfere with the ability to have a child. Talk with your doctor or health care professional if you are concerned about your fertility. This medicine may cause a decrease in Co-Enzyme Q-10. You should make sure that you get enough Co-Enzyme Q-10 while you are taking this medicine. Discuss the foods you eat and the vitamins you take with your health care professional. What side effects may I notice from receiving this  medicine? Side effects that you should report to your doctor or health care professional as soon as possible:  allergic reactions like skin rash, itching or hives, swelling of the face, lips, or tongue  breathing problems  chest pain  fast or irregular heartbeat  low blood counts - this medicine may decrease the number of white blood cells, red blood cells and platelets. You may be at increased risk for infections and bleeding.  pain, redness, or irritation at site where injected  signs of infection - fever or chills, cough, sore throat, pain or difficulty passing urine  signs of decreased platelets or bleeding - bruising, pinpoint red spots on the skin, black, tarry stools, blood in the urine  swelling of the ankles, feet, hands  tiredness  weakness Side effects that usually do not require medical attention (report to your doctor or health care professional if they continue or are bothersome):  diarrhea  hair loss  mouth sores  nail discoloration or damage  nausea  red colored urine  vomiting This list may not describe all possible side effects. Call your doctor for medical advice about side effects. You may report side effects to FDA at 1-800-FDA-1088. Where should I keep my medicine? This drug is given in a hospital or clinic and will  not be stored at home. NOTE: This sheet is a summary. It may not cover all possible information. If you have questions about this medicine, talk to your doctor, pharmacist, or health care provider.  2020 Elsevier/Gold Standard (2017-03-16 11:01:26)  Vinblastine injection What is this medicine? VINBLASTINE (vin BLAS teen) is a chemotherapy drug. It slows the growth of cancer cells. This medicine is used to treat many types of cancer like breast cancer, testicular cancer, Hodgkin's disease, non-Hodgkin's lymphoma, and sarcoma. This medicine may be used for other purposes; ask your health care provider or pharmacist if you have  questions. COMMON BRAND NAME(S): Velban What should I tell my health care provider before I take this medicine? They need to know if you have any of these conditions:  blood disorders  dental disease  gout  infection (especially a virus infection such as chickenpox, cold sores, or herpes)  liver disease  lung disease  nervous system disease  recent or ongoing radiation therapy  an unusual or allergic reaction to vinblastine, other chemotherapy agents, other medicines, foods, dyes, or preservatives  pregnant or trying to get pregnant  breast-feeding How should I use this medicine? This drug is given as an infusion into a vein. It is administered in a hospital or clinic by a specially trained health care professional. If you have pain, swelling, burning or any unusual feeling around the site of your injection, tell your health care professional right away. Talk to your pediatrician regarding the use of this medicine in children. While this drug may be prescribed for selected conditions, precautions do apply. Overdosage: If you think you have taken too much of this medicine contact a poison control center or emergency room at once. NOTE: This medicine is only for you. Do not share this medicine with others. What if I miss a dose? It is important not to miss your dose. Call your doctor or health care professional if you are unable to keep an appointment. What may interact with this medicine? Do not take this medicine with any of the following medications:  erythromycin  itraconazole  mibefradil  voriconazole This medicine may also interact with the following medications:  cyclosporine  fluconazole  ketoconazole  medicines for seizures like phenytoin  medicines to increase blood counts like filgrastim, pegfilgrastim, sargramostim  vaccines  verapamil Talk to your doctor or health care professional before taking any of these  medicines:  acetaminophen  aspirin  ibuprofen  ketoprofen  naproxen This list may not describe all possible interactions. Give your health care provider a list of all the medicines, herbs, non-prescription drugs, or dietary supplements you use. Also tell them if you smoke, drink alcohol, or use illegal drugs. Some items may interact with your medicine. What should I watch for while using this medicine? Your condition will be monitored carefully while you are receiving this medicine. You will need important blood work done while you are taking this medicine. This drug may make you feel generally unwell. This is not uncommon, as chemotherapy can affect healthy cells as well as cancer cells. Report any side effects. Continue your course of treatment even though you feel ill unless your doctor tells you to stop. In some cases, you may be given additional medicines to help with side effects. Follow all directions for their use. Call your doctor or health care professional for advice if you get a fever, chills or sore throat, or other symptoms of a cold or flu. Do not treat yourself. This drug decreases your  body's ability to fight infections. Try to avoid being around people who are sick. This medicine may increase your risk to bruise or bleed. Call your doctor or health care professional if you notice any unusual bleeding. Be careful brushing and flossing your teeth or using a toothpick because you may get an infection or bleed more easily. If you have any dental work done, tell your dentist you are receiving this medicine. Avoid taking products that contain aspirin, acetaminophen, ibuprofen, naproxen, or ketoprofen unless instructed by your doctor. These medicines may hide a fever. Do not become pregnant while taking this medicine. Women should inform their doctor if they wish to become pregnant or think they might be pregnant. There is a potential for serious side effects to an unborn child. Talk  to your health care professional or pharmacist for more information. Do not breast-feed an infant while taking this medicine. Men may have a lower sperm count while taking this medicine. Talk to your doctor if you plan to father a child. What side effects may I notice from receiving this medicine? Side effects that you should report to your doctor or health care professional as soon as possible:  allergic reactions like skin rash, itching or hives, swelling of the face, lips, or tongue  low blood counts - This drug may decrease the number of white blood cells, red blood cells and platelets. You may be at increased risk for infections and bleeding.  signs of infection - fever or chills, cough, sore throat, pain or difficulty passing urine  signs of decreased platelets or bleeding - bruising, pinpoint red spots on the skin, black, tarry stools, nosebleeds  signs of decreased red blood cells - unusually weak or tired, fainting spells, lightheadedness  breathing problems  changes in hearing  change in the amount of urine  chest pain  high blood pressure  mouth sores  nausea and vomiting  pain, swelling, redness or irritation at the injection site  pain, tingling, numbness in the hands or feet  problems with balance, dizziness  seizures Side effects that usually do not require medical attention (report to your doctor or health care professional if they continue or are bothersome):  constipation  hair loss  jaw pain  loss of appetite  sensitivity to light  stomach pain  tumor pain This list may not describe all possible side effects. Call your doctor for medical advice about side effects. You may report side effects to FDA at 1-800-FDA-1088. Where should I keep my medicine? This drug is given in a hospital or clinic and will not be stored at home. NOTE: This sheet is a summary. It may not cover all possible information. If you have questions about this medicine, talk  to your doctor, pharmacist, or health care provider.  2020 Elsevier/Gold Standard (2008-04-29 17:15:59)  Dacarbazine, DTIC injection What is this medicine? DACARBAZINE (da KAR ba zeen) is a chemotherapy drug. This medicine is used to treat skin cancer. It is also used with other medicines to treat Hodgkin's disease. This medicine may be used for other purposes; ask your health care provider or pharmacist if you have questions. COMMON BRAND NAME(S): DTIC-Dome What should I tell my health care provider before I take this medicine? They need to know if you have any of these conditions:  infection (especially virus infection such as chickenpox, cold sores, or herpes)  kidney disease  liver disease  low blood counts like low platelets, red blood cells, white blood cells  recent radiation therapy  an unusual or allergic reaction to dacarbazine, other chemotherapy agents, other medicines, foods, dyes, or preservatives  pregnant or trying to get pregnant  breast-feeding How should I use this medicine? This drug is given as an injection or infusion into a vein. It is administered in a hospital or clinic by a specially trained health care professional. Talk to your pediatrician regarding the use of this medicine in children. While this drug may be prescribed for selected conditions, precautions do apply. Overdosage: If you think you have taken too much of this medicine contact a poison control center or emergency room at once. NOTE: This medicine is only for you. Do not share this medicine with others. What if I miss a dose? It is important not to miss your dose. Call your doctor or health care professional if you are unable to keep an appointment. What may interact with this medicine?  medicines to increase blood counts like filgrastim, pegfilgrastim, sargramostim  vaccines This list may not describe all possible interactions. Give your health care provider a list of all the medicines,  herbs, non-prescription drugs, or dietary supplements you use. Also tell them if you smoke, drink alcohol, or use illegal drugs. Some items may interact with your medicine. What should I watch for while using this medicine? Your condition will be monitored carefully while you are receiving this medicine. You will need important blood work done while you are taking this medicine. This drug may make you feel generally unwell. This is not uncommon, as chemotherapy can affect healthy cells as well as cancer cells. Report any side effects. Continue your course of treatment even though you feel ill unless your doctor tells you to stop. Call your doctor or health care professional for advice if you get a fever, chills or sore throat, or other symptoms of a cold or flu. Do not treat yourself. This drug decreases your body's ability to fight infections. Try to avoid being around people who are sick. This medicine may increase your risk to bruise or bleed. Call your doctor or health care professional if you notice any unusual bleeding. Talk to your doctor about your risk of cancer. You may be more at risk for certain types of cancers if you take this medicine. Do not become pregnant while taking this medicine. Women should inform their doctor if they wish to become pregnant or think they might be pregnant. There is a potential for serious side effects to an unborn child. Talk to your health care professional or pharmacist for more information. Do not breast-feed an infant while taking this medicine. What side effects may I notice from receiving this medicine? Side effects that you should report to your doctor or health care professional as soon as possible:  allergic reactions like skin rash, itching or hives, swelling of the face, lips, or tongue  low blood counts - this medicine may decrease the number of white blood cells, red blood cells and platelets. You may be at increased risk for infections and  bleeding.  signs of infection - fever or chills, cough, sore throat, pain or difficulty passing urine  signs of decreased platelets or bleeding - bruising, pinpoint red spots on the skin, black, tarry stools, blood in the urine  signs of decreased red blood cells - unusually weak or tired, fainting spells, lightheadedness  breathing problems  muscle pains  pain at site where injected  trouble passing urine or change in the amount of urine  vomiting  yellowing of the eyes  or skin Side effects that usually do not require medical attention (report to your doctor or health care professional if they continue or are bothersome):  diarrhea  hair loss  loss of appetite  nausea  skin more sensitive to sun or ultraviolet light  stomach upset This list may not describe all possible side effects. Call your doctor for medical advice about side effects. You may report side effects to FDA at 1-800-FDA-1088. Where should I keep my medicine? This drug is given in a hospital or clinic and will not be stored at home. NOTE: This sheet is a summary. It may not cover all possible information. If you have questions about this medicine, talk to your doctor, pharmacist, or health care provider.  2020 Elsevier/Gold Standard (2015-10-03 15:17:39)  Brentuximab vedotin solution for injection What is this medicine? BRENTUXIMAB VEDOTIN (bren TUX see mab ve DOE tin) is a monoclonal antibody and a chemotherapy drug. It is used for treating Hodgkin lymphoma and certain non-Hodgkin lymphomas, such as anaplastic large-cell lymphoma, mycosis fungoides, and peripheral T-cell lymphoma. This medicine may be used for other purposes; ask your health care provider or pharmacist if you have questions. COMMON BRAND NAME(S): ADCETRIS What should I tell my health care provider before I take this medicine? They need to know if you have any of these conditions:  immune system problems  infection (especially a virus  infection such as chickenpox, cold sores, or herpes)  kidney disease  liver disease  low blood counts, like low white cell, platelet, or red cell counts  tingling of the fingers or toes, or other nerve disorder  an unusual or allergic reaction to brentuximab vedotin, other medicines, foods, dyes, or preservatives  pregnant or trying to get pregnant  breast-feeding How should I use this medicine? This medicine is for infusion into a vein. It is given by a health care professional in a hospital or clinic setting. Talk to your pediatrician regarding the use of this medicine in children. Special care may be needed. Overdosage: If you think you have taken too much of this medicine contact a poison control center or emergency room at once. NOTE: This medicine is only for you. Do not share this medicine with others. What if I miss a dose? It is important not to miss your dose. Call your doctor or health care professional if you are unable to keep an appointment. What may interact with this medicine? This medicine may interact with the following medications:  ketoconazole  rifampin  St. John's wort; Hypericum perforatum This list may not describe all possible interactions. Give your health care provider a list of all the medicines, herbs, non-prescription drugs, or dietary supplements you use. Also tell them if you smoke, drink alcohol, or use illegal drugs. Some items may interact with your medicine. What should I watch for while using this medicine? Visit your doctor for checks on your progress. This drug may make you feel generally unwell. Report any side effects. Continue your course of treatment even though you feel ill unless your doctor tells you to stop. Call your doctor or health care professional for advice if you get a fever, chills or sore throat, or other symptoms of a cold or flu. Do not treat yourself. This drug decreases your body's ability to fight infections. Try to avoid  being around people who are sick. This medicine may increase your risk to bruise or bleed. Call your doctor or health care professional if you notice any unusual bleeding. In some  patients, this medicine may cause a serious brain infection that may cause death. If you have any problems seeing, thinking, speaking, walking, or standing, tell your doctor right away. If you cannot reach your doctor, urgently seek other source of medical care. Do not become pregnant while taking this medicine or for 6 months after stopping it. Women should inform their doctor if they wish to become pregnant or think they might be pregnant. Men should not father a child while taking this medicine and for 6 months after stopping it. There is a potential for serious side effects to an unborn child. Talk to your health care professional or pharmacist for more information. Do not breast-feed an infant while taking this medicine. This may interfere with the ability to father a child. You should talk to your doctor or health care professional if you are concerned about your fertility. What side effects may I notice from receiving this medicine? Side effects that you should report to your doctor or health care professional as soon as possible:  allergic reactions like skin rash, itching or hives, swelling of the face, lips, or tongue  changes in emotions or moods  diarrhea  low blood counts - this medicine may decrease the number of white blood cells, red blood cells and platelets. You may be at increased risk for infections and bleeding.  pain, tingling, numbness in the hands or feet  redness, blistering, peeling or loosening of the skin, including inside the mouth  shortness of breath  signs of infection - fever or chills, cough, sore throat, pain or difficulty passing urine  signs of decreased platelets or bleeding - bruising, pinpoint red spots on the skin, black, tarry stools, blood in the urine  signs of decreased  red blood cells - unusually weak or tired, fainting spells, lightheadedness  signs of liver injury like dark yellow or brown urine; general ill feeling or flu-like symptoms; light-colored stools; loss of appetite; nausea; right upper belly pain; yellowing of the eyes or skin  stomach pain  sudden numbness or weakness of the face, arm or leg  vomiting Side effects that usually do not require medical attention (report to your doctor or health care professional if they continue or are bothersome):  constipation  dizziness  headache  muscle pain  tiredness This list may not describe all possible side effects. Call your doctor for medical advice about side effects. You may report side effects to FDA at 1-800-FDA-1088. Where should I keep my medicine? This drug is given in a hospital or clinic and will not be stored at home. NOTE: This sheet is a summary. It may not cover all possible information. If you have questions about this medicine, talk to your doctor, pharmacist, or health care provider.  2020 [employer]/Gold Standard (2017-07-04 14:10:02)  Coronavirus (COVID-19) Are you at risk?  Are you at risk for the Coronavirus (COVID-19)?  To be considered HIGH RISK for Coronavirus (COVID-19), you have to meet the following criteria:  . Traveled to [place], [place], [place], [place] or [place]; or in the [place] to [place], [place], [place], or [place]; and have fever, cough, and shortness of breath within the last 2 weeks of travel OR . Been in close contact with a person diagnosed with COVID-19 within the last 2 weeks and have fever, cough, and shortness of breath . IF YOU DO NOT MEET THESE CRITERIA, YOU ARE CONSIDERED LOW RISK FOR COVID-19.  What to do if you are HIGH RISK for  COVID-19?  Marland Kitchen If you are having a medical emergency, call 911. . Seek medical care right away. Before you go to a doctor's office, urgent care or emergency department, call ahead and tell them about  your recent travel, contact with someone diagnosed with COVID-19, and your symptoms. You should receive instructions from your physician's office regarding next steps of care.  . When you arrive at healthcare provider, tell the healthcare staff immediately you have returned from visiting [place], [place], [place], [place] or [place]; or traveled in the [place] to [place], [place], [place], or [place]; in the last two weeks or you have been in close contact with a person diagnosed with COVID-19 in the last 2 weeks.   . Tell the health care staff about your symptoms: fever, cough and shortness of breath. . After you have been seen by a medical provider, you will be either: o Tested for (COVID-19) and discharged home on quarantine except to seek medical care if symptoms worsen, and asked to  - Stay home and avoid contact with others until you get your results (4-5 days)  - Avoid travel on public transportation if possible (such as bus, train, or airplane) or o Sent to the Emergency Department by EMS for evaluation, COVID-19 testing, and possible admission depending on your condition and test results.  What to do if you are LOW RISK for COVID-19?  Reduce your risk of any infection by using the same precautions used for avoiding the common cold or flu:  Marland Kitchen Wash your hands often with soap and warm water for at least 20 seconds.  If soap and water are not readily available, use an alcohol-based hand sanitizer with at least 60% alcohol.  . If coughing or sneezing, cover your mouth and nose by coughing or sneezing into the elbow areas of your shirt or coat, into a tissue or into your sleeve (not your hands). . Avoid shaking hands with others and consider head nods or verbal greetings only. . Avoid touching your eyes, nose, or mouth with unwashed hands.  . Avoid close contact with people who are sick. . Avoid places or events with large numbers of people in one location, like concerts or sporting  events. . Carefully consider travel plans you have or are making. . If you are planning any travel outside or inside the [place], visit the CDC's Travelers' Health webpage for the latest health notices. . If you have some symptoms but not all symptoms, continue to monitor at home and seek medical attention if your symptoms worsen. . If you are having a medical emergency, call 911.   [place] / e-Visit: [email or web address]         [facility]: [facility]: [id number]                   [facility]: [phone]

## 2019-07-13 ENCOUNTER — Inpatient Hospital Stay: Payer: Medicaid Other

## 2019-07-15 ENCOUNTER — Inpatient Hospital Stay: Payer: Medicaid Other

## 2019-07-15 ENCOUNTER — Telehealth: Payer: Self-pay | Admitting: Internal Medicine

## 2019-07-15 VITALS — BP 142/87 | Temp 97.0°F | Resp 18

## 2019-07-15 DIAGNOSIS — C8118 Nodular sclerosis classical Hodgkin lymphoma, lymph nodes of multiple sites: Secondary | ICD-10-CM

## 2019-07-15 DIAGNOSIS — Z5112 Encounter for antineoplastic immunotherapy: Secondary | ICD-10-CM | POA: Diagnosis not present

## 2019-07-15 MED ORDER — PEGFILGRASTIM-CBQV 6 MG/0.6ML ~~LOC~~ SOSY
6.0000 mg | PREFILLED_SYRINGE | Freq: Once | SUBCUTANEOUS | Status: AC
  Administered 2019-07-15: 6 mg via SUBCUTANEOUS

## 2019-07-15 MED ORDER — PEGFILGRASTIM-CBQV 6 MG/0.6ML ~~LOC~~ SOSY
PREFILLED_SYRINGE | SUBCUTANEOUS | Status: AC
  Filled 2019-07-15: qty 0.6

## 2019-07-15 NOTE — Telephone Encounter (Signed)
Scheduled appt per RN Meredeth Ide) request for 11/29

## 2019-07-15 NOTE — Patient Instructions (Signed)

## 2019-07-24 NOTE — Progress Notes (Signed)
[facility] OFFICE PROGRESS NOTE  Patient, No Pcp Per No address on file  DIAGNOSIS: Stage IVB Hodgkin's Lymphoma, nodular sclerosis subtype. He presented with a bulky left cervical and supraclavicular lymphadenopathy as well as involvement of the spleen, nodal disease in the porta hepatis, and extensive disease involving the marrow of the pelvis, spine, and ribs. He was diagnosed in September 2020.   IPS score: 4  PRIOR THERAPY: None  CURRENT THERAPY: Systemic chemotherapy with brentuximab vedotin 1.2 mg/kg, doxorubicin 25 mg/m2, vinblastine 6 mg/m2, dacarbazine 375 mg/m2 on days 1 and 15 every 28 days. First dose expected on 05/30/2019.  Status post 2 cycles.  INTERVAL HISTORY: Jesse [name] 53 y.o. male returns to the clinic for a follow-up visit.  The patient is feeling well today without any concerning complaints except for low back pain that radiates bilaterally down his thighs.  The patient states that this has been going on persistently for several weeks and he feels the pain primarily when he is lying down and at nighttime.  The patient describes his pain as a zapping or a burning pain.  The patient is taking oxycodone 3 times per day which reduces his pain to a tolerable level.  The patient states that he may have hurt his back about 2 weeks ago while he was on a go-cart but states that he may have a history of degenerative changes in his hips as well.   Otherwise, the patient has been tolerating his treatment well without any concerning adverse side effects.  He states that he is no longer getting intermittent fevers or night sweats.  The patient has gained a couple pounds since his last appointment.  The lymphadenopathy in the left side of his neck has improved considerably as well.  He denies any chest pain, shortness of breath, cough, or hemoptysis.  He reports one episode of vomiting "a little" approximately 3 days ago; however, he states that it was the  only time since starting treatment. He has been experiencing constipation secondary to his pain medication. He denies any numbness and tingling in his hands or feet.  The patient notes that sometimes his right hand might go numb; however, it is positional and improves when he "shakes his hand out".  The patient is here today for evaluation before starting day 1 of cycle #3.  MEDICAL HISTORY: Past Medical History:  Diagnosis Date  . ETOH abuse   . Lymphadenopathy    left  . Marijuana abuse     ALLERGIES:  has No Known Allergies.  MEDICATIONS:  Current Outpatient Medications  Medication Sig Dispense Refill  . acetaminophen (TYLENOL) 500 MG tablet Take 1,000 mg by mouth every 6 (six) hours as needed.    Marland Kitchen allopurinol (ZYLOPRIM) 100 MG tablet Take 1 tablet (100 mg total) by mouth 2 (two) times daily. 60 tablet 2  . ibuprofen (ADVIL) 800 MG tablet Take 800 mg by mouth every 8 (eight) hours as needed.    . lidocaine-prilocaine (EMLA) cream Apply 1 application topically as needed. Apply to port site  45 minutes prior to port being accessed 30 g 1  . oxyCODONE-acetaminophen (PERCOCET/ROXICET) 5-325 MG tablet Take 1 tablet by mouth every 8 (eight) hours as needed for severe pain. 30 tablet 0  . prochlorperazine (COMPAZINE) 10 MG tablet Take 1 tablet (10 mg total) by mouth every 8 (eight) hours as needed for nausea or vomiting. 30 tablet 2   No current facility-administered medications for this visit.  SURGICAL HISTORY:  Past Surgical History:  Procedure Laterality Date  . HERNIA REPAIR     as child  . IR IMAGING GUIDED PORT INSERTION  05/29/2019  . LYMPH NODE BIOPSY Left 05/07/2019   Procedure: LYMPH NODE BIOPSY;  Surgeon: Melida Quitter, MD;  Location: [facility];  Service: ENT;  Laterality: Left;    REVIEW OF SYSTEMS:   Review of Systems  Constitutional: Negative for appetite change, chills, fatigue, fever and unexpected weight change.  HENT: Negative for mouth sores,  nosebleeds, sore throat and trouble swallowing.   Eyes: Negative for eye problems and icterus.  Respiratory: Negative for cough, hemoptysis, shortness of breath and wheezing.   Cardiovascular: Negative for chest pain and leg swelling.  Gastrointestinal: Positive for constipation. Negative for abdominal pain, diarrhea, nausea and vomiting.  Genitourinary: Negative for bladder incontinence, difficulty urinating, dysuria, frequency and hematuria.   Musculoskeletal: Positive for low back pain that radiates to his anterolateral thigh bilaterally. Negative for gait problem, neck pain and neck stiffness.  Skin: Negative for itching and rash.  Neurological: Negative for dizziness, extremity weakness, gait problem, headaches, light-headedness and seizures.  Hematological: Negative for adenopathy. Does not bruise/bleed easily.  Psychiatric/Behavioral: Negative for confusion, depression and sleep disturbance. The patient is not nervous/anxious.     PHYSICAL EXAMINATION:  There were no vitals taken for this visit.  ECOG PERFORMANCE STATUS: 1 - Symptomatic but completely ambulatory  Physical Exam  Constitutional: Oriented to person, place, and time and well-developed, well-nourished, and in no distress.  HENT:  Head: Normocephalic and atraumatic.  Mouth/Throat: Oropharynx is clear and moist. No oropharyngeal exudate.  Eyes: Conjunctivae are normal. Right eye exhibits no discharge. Left eye exhibits no discharge. No scleral icterus.  Neck: Normal range of motion. Neck supple.  Cardiovascular: Normal rate, regular rhythm, normal heart sounds and intact distal pulses.   Pulmonary/Chest: Effort normal and breath sounds normal. No respiratory distress. No wheezes. No rales.  Abdominal: Soft. Bowel sounds are normal. Exhibits no distension and no mass. There is no tenderness.  Musculoskeletal: Normal range of motion. Exhibits no edema.  Lymphadenopathy:   L cervical adenopathy improved.  Neurological:  Alert and oriented to person, place, and time. Exhibits normal muscle tone. Gait normal. Coordination normal.  Skin: Skin is warm and dry. No rash noted. Not diaphoretic. No erythema. No pallor.  Psychiatric: Mood, memory and judgment normal.  Vitals reviewed.  LABORATORY DATA: Lab Results  Component Value Date   WBC 6.4 07/11/2019   HGB 11.6 (L) 07/11/2019   HCT 35.7 (L) 07/11/2019   MCV 91.1 07/11/2019   PLT 349 07/11/2019      Chemistry      Component Value Date/Time   NA 137 07/11/2019 1139   K 4.0 07/11/2019 1139   CL 101 07/11/2019 1139   CO2 25 07/11/2019 1139   BUN 14 07/11/2019 1139   CREATININE 0.87 07/11/2019 1139      Component Value Date/Time   CALCIUM 9.0 07/11/2019 1139   ALKPHOS 142 (H) 07/11/2019 1139   AST 14 (L) 07/11/2019 1139   ALT 18 07/11/2019 1139   BILITOT 0.3 07/11/2019 1139       RADIOGRAPHIC STUDIES:  No results found.   ASSESSMENT/PLAN:  This is a very pleasant 53 years old African-American male recently diagnosed with a stage IV non-Hodgkin lymphoma, nodular sclerosing subtype. He presented with a bulky left cervical and supraclavicular lymphadenopathy as well as involvement of the spleen, nodal disease in the porta hepatis, and extensive  disease involving the marrow of the pelvis, spine, and ribs. He was diagnosed in September 2020.   The patient is currently undergoing systemic chemotherapy with brentuximab Vedotin in addition to doxorubicin, vinblastine and dacarbazine.  Status post 2 cycles.  He tolerated the first cycle of his treatment fairly well.  The patient was seen with Dr. Julien Nordmann today. Labs were reviewed. We recommend that he proceed with cycle 3 today as scheduled.   We will see him back for a follow up visit in 2 weeks for evaluation before starting day 15 of cycle #3.   I will arrange for the patient to have a restaging PET scan performed prior to starting cycle #4.   A hand out on constipation education was provided to  the patient today. Discussed that he may consider taking a stool softer such as Colace or Senna one tablet twice a day everyday to avoid constipation. He also was encouraged to drinking plenty of fluid, eat fruits and vegetables, and that being active also reduces the risk of constipation. If he has not had a bowel movement for 2 days or more than his normal bowel habit frequency, he was advised that he may take on of the OTC laxatives such as MiraLax, Milk of Magnesia or Mag Citrate.  The patient still has oxycodone left. Dr. Julien Nordmann discussed that he would like the patient to try to decrease his percocet use and transition to tylenol instead.   The patient was advised to call immediately if he has any concerning symptoms in the interval. The patient voices understanding of current disease status and treatment options and is in agreement with the current care plan. All questions were answered. The patient knows to call the clinic with any problems, questions or concerns. We can certainly see the patient much sooner if necessary  No orders of the defined types were placed in this encounter.    Renita Brocks L Brainard Highfill, PA-C 07/24/19  ADDENDUM: Hematology/Oncology Attending: I had a face-to-face encounter with the patient today.  I recommended his care plan.  This is a very pleasant 53 years old African-American male diagnosed with stage IV Hodgkin's lymphoma, nodular sclerosing subtype.  The patient is currently undergoing systemic chemotherapy with brentuximab Vedotin,, doxorubicin, vinblastine and dacarbazine status post 2 cycles.  He has been tolerating this treatment well with no concerning adverse effects. I recommended for the patient to proceed with cycle #3 today as planned. We will arrange for the patient to have repeat PET scan after completion of cycle #3 for restaging of his disease. For the back pain he will continue with the current pain medication.  For the constipation the patient  will continue his current treatment with MiraLAX as well as milk of magnesia. He was advised to call immediately if he has any other concerning symptoms in the interval.  Disclaimer: This note was dictated with voice recognition software. Similar sounding words can inadvertently be transcribed and may be missed upon review. Eilleen Kempf, MD 07/25/19

## 2019-07-25 ENCOUNTER — Other Ambulatory Visit: Payer: Self-pay

## 2019-07-25 ENCOUNTER — Encounter: Payer: Self-pay | Admitting: Physician Assistant

## 2019-07-25 ENCOUNTER — Inpatient Hospital Stay: Payer: Medicaid Other

## 2019-07-25 ENCOUNTER — Inpatient Hospital Stay: Payer: Medicaid Other | Attending: Internal Medicine | Admitting: Physician Assistant

## 2019-07-25 VITALS — BP 130/90 | HR 90 | Temp 98.5°F | Resp 16 | Ht 66.0 in | Wt 139.8 lb

## 2019-07-25 DIAGNOSIS — Z5112 Encounter for antineoplastic immunotherapy: Secondary | ICD-10-CM | POA: Insufficient documentation

## 2019-07-25 DIAGNOSIS — C8118 Nodular sclerosis classical Hodgkin lymphoma, lymph nodes of multiple sites: Secondary | ICD-10-CM

## 2019-07-25 DIAGNOSIS — R1110 Vomiting, unspecified: Secondary | ICD-10-CM | POA: Diagnosis not present

## 2019-07-25 DIAGNOSIS — Z5111 Encounter for antineoplastic chemotherapy: Secondary | ICD-10-CM

## 2019-07-25 DIAGNOSIS — C8110 Nodular sclerosis classical Hodgkin lymphoma, unspecified site: Secondary | ICD-10-CM | POA: Insufficient documentation

## 2019-07-25 DIAGNOSIS — K5900 Constipation, unspecified: Secondary | ICD-10-CM | POA: Diagnosis not present

## 2019-07-25 DIAGNOSIS — M545 Low back pain: Secondary | ICD-10-CM | POA: Diagnosis not present

## 2019-07-25 DIAGNOSIS — Z5189 Encounter for other specified aftercare: Secondary | ICD-10-CM | POA: Diagnosis not present

## 2019-07-25 DIAGNOSIS — G893 Neoplasm related pain (acute) (chronic): Secondary | ICD-10-CM

## 2019-07-25 DIAGNOSIS — R251 Tremor, unspecified: Secondary | ICD-10-CM | POA: Diagnosis not present

## 2019-07-25 DIAGNOSIS — F1210 Cannabis abuse, uncomplicated: Secondary | ICD-10-CM | POA: Insufficient documentation

## 2019-07-25 DIAGNOSIS — Z79899 Other long term (current) drug therapy: Secondary | ICD-10-CM | POA: Insufficient documentation

## 2019-07-25 LAB — CBC WITH DIFFERENTIAL (CANCER CENTER ONLY)
Abs Immature Granulocytes: 8.23 10*3/uL — ABNORMAL HIGH (ref 0.00–0.07)
Basophils Absolute: 0.1 10*3/uL (ref 0.0–0.1)
Basophils Relative: 0 %
Eosinophils Absolute: 0.1 10*3/uL (ref 0.0–0.5)
Eosinophils Relative: 0 %
HCT: 36.2 % — ABNORMAL LOW (ref 39.0–52.0)
Hemoglobin: 11.8 g/dL — ABNORMAL LOW (ref 13.0–17.0)
Immature Granulocytes: 21 %
Lymphocytes Relative: 7 %
Lymphs Abs: 2.6 10*3/uL (ref 0.7–4.0)
MCH: 30.3 pg (ref 26.0–34.0)
MCHC: 32.6 g/dL (ref 30.0–36.0)
MCV: 93.1 fL (ref 80.0–100.0)
Monocytes Absolute: 2.2 10*3/uL — ABNORMAL HIGH (ref 0.1–1.0)
Monocytes Relative: 6 %
Neutro Abs: 25.9 10*3/uL — ABNORMAL HIGH (ref 1.7–7.7)
Neutrophils Relative %: 66 %
Platelet Count: 321 10*3/uL (ref 150–400)
RBC: 3.89 MIL/uL — ABNORMAL LOW (ref 4.22–5.81)
RDW: 17.3 % — ABNORMAL HIGH (ref 11.5–15.5)
WBC Count: 39 10*3/uL — ABNORMAL HIGH (ref 4.0–10.5)
nRBC: 0.3 % — ABNORMAL HIGH (ref 0.0–0.2)

## 2019-07-25 LAB — CMP (CANCER CENTER ONLY)
ALT: 29 U/L (ref 0–44)
AST: 21 U/L (ref 15–41)
Albumin: 3.8 g/dL (ref 3.5–5.0)
Alkaline Phosphatase: 192 U/L — ABNORMAL HIGH (ref 38–126)
Anion gap: 9 (ref 5–15)
BUN: 16 mg/dL (ref 6–20)
CO2: 28 mmol/L (ref 22–32)
Calcium: 8.9 mg/dL (ref 8.9–10.3)
Chloride: 105 mmol/L (ref 98–111)
Creatinine: 0.82 mg/dL (ref 0.61–1.24)
GFR, Est AFR Am: >60 mL/min (ref >60)
GFR, Estimated: >60 mL/min (ref >60)
Glucose, Bld: 66 mg/dL — ABNORMAL LOW (ref 70–99)
Potassium: 4.2 mmol/L (ref 3.5–5.1)
Sodium: 142 mmol/L (ref 135–145)
Total Bilirubin: 0.3 mg/dL (ref 0.3–1.2)
Total Protein: 7.3 g/dL (ref 6.5–8.1)

## 2019-07-25 LAB — LACTATE DEHYDROGENASE: LDH: 381 U/L — ABNORMAL HIGH (ref 98–192)

## 2019-07-25 MED ORDER — HEPARIN SOD (PORK) LOCK FLUSH 100 UNIT/ML IV SOLN
500.0000 [IU] | Freq: Once | INTRAVENOUS | Status: AC | PRN
  Administered 2019-07-25: 500 [IU]
  Filled 2019-07-25: qty 5

## 2019-07-25 MED ORDER — DIPHENHYDRAMINE HCL 50 MG/ML IJ SOLN
50.0000 mg | Freq: Once | INTRAMUSCULAR | Status: AC
  Administered 2019-07-25: 11:00:00 50 mg via INTRAVENOUS

## 2019-07-25 MED ORDER — SODIUM CHLORIDE 0.9 % IV SOLN
Freq: Once | INTRAVENOUS | Status: AC
  Administered 2019-07-25: 11:00:00 via INTRAVENOUS
  Filled 2019-07-25: qty 250

## 2019-07-25 MED ORDER — PALONOSETRON HCL INJECTION 0.25 MG/5ML
0.2500 mg | Freq: Once | INTRAVENOUS | Status: AC
  Administered 2019-07-25: 0.25 mg via INTRAVENOUS

## 2019-07-25 MED ORDER — VINBLASTINE SULFATE CHEMO INJECTION 1 MG/ML
5.8000 mg/m2 | Freq: Once | INTRAVENOUS | Status: AC
  Administered 2019-07-25: 10 mg via INTRAVENOUS
  Filled 2019-07-25: qty 10

## 2019-07-25 MED ORDER — PALONOSETRON HCL INJECTION 0.25 MG/5ML
INTRAVENOUS | Status: AC
  Filled 2019-07-25: qty 5

## 2019-07-25 MED ORDER — SODIUM CHLORIDE 0.9 % IV SOLN
375.0000 mg/m2 | Freq: Once | INTRAVENOUS | Status: AC
  Administered 2019-07-25: 650 mg via INTRAVENOUS
  Filled 2019-07-25: qty 65

## 2019-07-25 MED ORDER — SODIUM CHLORIDE 0.9 % IV SOLN
1.2000 mg/kg | Freq: Once | INTRAVENOUS | Status: AC
  Administered 2019-07-25: 14:00:00 75 mg via INTRAVENOUS
  Filled 2019-07-25: qty 15

## 2019-07-25 MED ORDER — DIPHENHYDRAMINE HCL 50 MG/ML IJ SOLN
INTRAMUSCULAR | Status: AC
  Filled 2019-07-25: qty 1

## 2019-07-25 MED ORDER — DOXORUBICIN HCL CHEMO IV INJECTION 2 MG/ML
25.0000 mg/m2 | Freq: Once | INTRAVENOUS | Status: AC
  Administered 2019-07-25: 12:00:00 44 mg via INTRAVENOUS
  Filled 2019-07-25: qty 22

## 2019-07-25 MED ORDER — ACETAMINOPHEN 325 MG PO TABS
ORAL_TABLET | ORAL | Status: AC
  Filled 2019-07-25: qty 2

## 2019-07-25 MED ORDER — ACETAMINOPHEN 325 MG PO TABS
650.0000 mg | ORAL_TABLET | Freq: Once | ORAL | Status: AC
  Administered 2019-07-25: 11:00:00 650 mg via ORAL

## 2019-07-25 MED ORDER — SODIUM CHLORIDE 0.9% FLUSH
10.0000 mL | INTRAVENOUS | Status: DC | PRN
  Administered 2019-07-25: 10 mL
  Filled 2019-07-25: qty 10

## 2019-07-25 MED ORDER — SODIUM CHLORIDE 0.9 % IV SOLN
Freq: Once | INTRAVENOUS | Status: AC
  Administered 2019-07-25: 11:00:00 via INTRAVENOUS
  Filled 2019-07-25: qty 5

## 2019-07-25 NOTE — Patient Instructions (Signed)
It is common for patients who are undergoing treatment and taking certain prescribed medications to experience side-effects with constipation.  If you experience constipation, please take stool softener such as Colace or Senna one tablet twice a day everyday to avoid constipation.  These medications are available over the counter.  Of course, if you have diarrhea, stop taking stool softeners.  Drinking plenty of fluid, eating fruits and vegetable, and being active also reduces the risk of constipation.   If despite taking stool softeners, and you still have no bowel movement for 2 days or more than your normal bowel habit frequency, please take one of the following over the counter laxatives:  MiraLax, Milk of Magnesia or Mag Citrate everyday and contact me immediately for further instructions.  The goal is to have at least one bowel movement every other day.   

## 2019-07-25 NOTE — Patient Instructions (Signed)
Jesse [name] Discharge Instructions for Patients Receiving Chemotherapy  Today you received the following chemotherapy agents: Doxorubicin (ADRIAMYCIN), Vinblastine (VELBAN), Dacarbazine (DTIC) & Brentuximab (ADECETRIS).  To help prevent nausea and vomiting after your treatment, we encourage you to take your nausea medication as prescribed.  If you develop nausea and vomiting that is not controlled by your nausea medication, call the clinic.   BELOW ARE SYMPTOMS THAT SHOULD BE REPORTED IMMEDIATELY:  *FEVER GREATER THAN 100.5 F  *CHILLS WITH OR WITHOUT FEVER  NAUSEA AND VOMITING THAT IS NOT CONTROLLED WITH YOUR NAUSEA MEDICATION  *UNUSUAL SHORTNESS OF BREATH  *UNUSUAL BRUISING OR BLEEDING  TENDERNESS IN MOUTH AND THROAT WITH OR WITHOUT PRESENCE OF ULCERS  *URINARY PROBLEMS  *BOWEL PROBLEMS  UNUSUAL RASH Items with * indicate a potential emergency and should be followed up as soon as possible.  Feel free to call the clinic should you have any questions or concerns. The clinic phone number is [phone].  Please show the [place] [facility] at check-in to the Emergency Department and triage nurse.

## 2019-07-26 ENCOUNTER — Telehealth: Payer: Self-pay | Admitting: Internal Medicine

## 2019-07-26 NOTE — Telephone Encounter (Signed)
Scheduled per los. Called and left msg. mailed prntout

## 2019-07-27 ENCOUNTER — Inpatient Hospital Stay: Payer: Medicaid Other

## 2019-07-27 ENCOUNTER — Other Ambulatory Visit: Payer: Self-pay

## 2019-07-27 VITALS — BP 129/85 | HR 89 | Temp 98.5°F | Resp 16

## 2019-07-27 DIAGNOSIS — C8118 Nodular sclerosis classical Hodgkin lymphoma, lymph nodes of multiple sites: Secondary | ICD-10-CM

## 2019-07-27 DIAGNOSIS — Z5112 Encounter for antineoplastic immunotherapy: Secondary | ICD-10-CM | POA: Diagnosis not present

## 2019-07-27 MED ORDER — PEGFILGRASTIM-CBQV 6 MG/0.6ML ~~LOC~~ SOSY
PREFILLED_SYRINGE | SUBCUTANEOUS | Status: AC
  Filled 2019-07-27: qty 0.6

## 2019-07-27 MED ORDER — PEGFILGRASTIM-CBQV 6 MG/0.6ML ~~LOC~~ SOSY
6.0000 mg | PREFILLED_SYRINGE | Freq: Once | SUBCUTANEOUS | Status: AC
  Administered 2019-07-27: 6 mg via SUBCUTANEOUS

## 2019-07-27 NOTE — Patient Instructions (Signed)

## 2019-08-01 ENCOUNTER — Other Ambulatory Visit: Payer: Self-pay | Admitting: Physician Assistant

## 2019-08-01 DIAGNOSIS — G893 Neoplasm related pain (acute) (chronic): Secondary | ICD-10-CM

## 2019-08-01 MED FILL — ALLOPURINOL 100 MG TABS: 100 | 30 days supply | Qty: 60 | Fill #0

## 2019-08-01 MED FILL — OXYCODONE-ACETAMINOPHEN 5-3: 5-325 | 10 days supply | Qty: 30 | Fill #0

## 2019-08-02 ENCOUNTER — Telehealth: Payer: Self-pay | Admitting: *Deleted

## 2019-08-02 NOTE — Telephone Encounter (Signed)
States he is having major toothache . Instructed to call his dentist. He is going to call a dentist today

## 2019-08-08 ENCOUNTER — Inpatient Hospital Stay (HOSPITAL_BASED_OUTPATIENT_CLINIC_OR_DEPARTMENT_OTHER): Payer: Medicaid Other | Admitting: Internal Medicine

## 2019-08-08 ENCOUNTER — Other Ambulatory Visit: Payer: Self-pay

## 2019-08-08 ENCOUNTER — Inpatient Hospital Stay: Payer: Medicaid Other

## 2019-08-08 ENCOUNTER — Encounter: Payer: Self-pay | Admitting: Internal Medicine

## 2019-08-08 VITALS — BP 133/89 | HR 73 | Temp 98.3°F | Resp 18 | Ht 66.0 in | Wt 139.2 lb

## 2019-08-08 DIAGNOSIS — Z5111 Encounter for antineoplastic chemotherapy: Secondary | ICD-10-CM

## 2019-08-08 DIAGNOSIS — C8118 Nodular sclerosis classical Hodgkin lymphoma, lymph nodes of multiple sites: Secondary | ICD-10-CM

## 2019-08-08 DIAGNOSIS — Z5112 Encounter for antineoplastic immunotherapy: Secondary | ICD-10-CM | POA: Diagnosis not present

## 2019-08-08 DIAGNOSIS — Z95828 Presence of other vascular implants and grafts: Secondary | ICD-10-CM

## 2019-08-08 LAB — LACTATE DEHYDROGENASE: LDH: 200 U/L — ABNORMAL HIGH (ref 98–192)

## 2019-08-08 LAB — CBC WITH DIFFERENTIAL (CANCER CENTER ONLY)
Abs Immature Granulocytes: 0.08 10*3/uL — ABNORMAL HIGH (ref 0.00–0.07)
Basophils Absolute: 0 10*3/uL (ref 0.0–0.1)
Basophils Relative: 1 %
Eosinophils Absolute: 0 10*3/uL (ref 0.0–0.5)
Eosinophils Relative: 1 %
HCT: 31.3 % — ABNORMAL LOW (ref 39.0–52.0)
Hemoglobin: 9.9 g/dL — ABNORMAL LOW (ref 13.0–17.0)
Immature Granulocytes: 1 %
Lymphocytes Relative: 16 %
Lymphs Abs: 1.1 10*3/uL (ref 0.7–4.0)
MCH: 28.7 pg (ref 26.0–34.0)
MCHC: 31.6 g/dL (ref 30.0–36.0)
MCV: 90.7 fL (ref 80.0–100.0)
Monocytes Absolute: 0.9 10*3/uL (ref 0.1–1.0)
Monocytes Relative: 13 %
Neutro Abs: 4.8 10*3/uL (ref 1.7–7.7)
Neutrophils Relative %: 68 %
Platelet Count: 432 10*3/uL — ABNORMAL HIGH (ref 150–400)
RBC: 3.45 MIL/uL — ABNORMAL LOW (ref 4.22–5.81)
RDW: 17 % — ABNORMAL HIGH (ref 11.5–15.5)
WBC Count: 7 10*3/uL (ref 4.0–10.5)
nRBC: 0 % (ref 0.0–0.2)

## 2019-08-08 LAB — CMP (CANCER CENTER ONLY)
ALT: 12 U/L (ref 0–44)
AST: 14 U/L — ABNORMAL LOW (ref 15–41)
Albumin: 3.5 g/dL (ref 3.5–5.0)
Alkaline Phosphatase: 135 U/L — ABNORMAL HIGH (ref 38–126)
Anion gap: 10 (ref 5–15)
BUN: 16 mg/dL (ref 6–20)
CO2: 25 mmol/L (ref 22–32)
Calcium: 8.8 mg/dL — ABNORMAL LOW (ref 8.9–10.3)
Chloride: 105 mmol/L (ref 98–111)
Creatinine: 0.8 mg/dL (ref 0.61–1.24)
GFR, Est AFR Am: >60 mL/min (ref >60)
GFR, Estimated: >60 mL/min (ref >60)
Glucose, Bld: 79 mg/dL (ref 70–99)
Potassium: 4.1 mmol/L (ref 3.5–5.1)
Sodium: 140 mmol/L (ref 135–145)
Total Bilirubin: 0.2 mg/dL — ABNORMAL LOW (ref 0.3–1.2)
Total Protein: 7 g/dL (ref 6.5–8.1)

## 2019-08-08 MED ORDER — SODIUM CHLORIDE 0.9 % IV SOLN
1.2000 mg/kg | Freq: Once | INTRAVENOUS | Status: AC
  Administered 2019-08-08: 16:00:00 75 mg via INTRAVENOUS
  Filled 2019-08-08: qty 15

## 2019-08-08 MED ORDER — ACETAMINOPHEN 325 MG PO TABS
650.0000 mg | ORAL_TABLET | Freq: Once | ORAL | Status: AC
  Administered 2019-08-08: 13:00:00 650 mg via ORAL

## 2019-08-08 MED ORDER — DIPHENHYDRAMINE HCL 50 MG/ML IJ SOLN
INTRAMUSCULAR | Status: AC
  Filled 2019-08-08: qty 1

## 2019-08-08 MED ORDER — SODIUM CHLORIDE 0.9% FLUSH
10.0000 mL | INTRAVENOUS | Status: DC | PRN
  Administered 2019-08-08: 11:00:00 10 mL
  Filled 2019-08-08: qty 10

## 2019-08-08 MED ORDER — ACETAMINOPHEN 325 MG PO TABS
ORAL_TABLET | ORAL | Status: AC
  Filled 2019-08-08: qty 2

## 2019-08-08 MED ORDER — SODIUM CHLORIDE 0.9 % IV SOLN
Freq: Once | INTRAVENOUS | Status: AC
  Filled 2019-08-08: qty 250

## 2019-08-08 MED ORDER — DIPHENHYDRAMINE HCL 50 MG/ML IJ SOLN
50.0000 mg | Freq: Once | INTRAMUSCULAR | Status: AC
  Administered 2019-08-08: 13:00:00 50 mg via INTRAVENOUS

## 2019-08-08 MED ORDER — PALONOSETRON HCL INJECTION 0.25 MG/5ML
0.2500 mg | Freq: Once | INTRAVENOUS | Status: AC
  Administered 2019-08-08: 13:00:00 0.25 mg via INTRAVENOUS

## 2019-08-08 MED ORDER — SODIUM CHLORIDE 0.9 % IV SOLN
Freq: Once | INTRAVENOUS | Status: AC
  Filled 2019-08-08: qty 5

## 2019-08-08 MED ORDER — SODIUM CHLORIDE 0.9 % IV SOLN
375.0000 mg/m2 | Freq: Once | INTRAVENOUS | Status: AC
  Administered 2019-08-08: 15:00:00 650 mg via INTRAVENOUS
  Filled 2019-08-08: qty 65

## 2019-08-08 MED ORDER — PALONOSETRON HCL INJECTION 0.25 MG/5ML
INTRAVENOUS | Status: AC
  Filled 2019-08-08: qty 5

## 2019-08-08 MED ORDER — DOXORUBICIN HCL CHEMO IV INJECTION 2 MG/ML
25.0000 mg/m2 | Freq: Once | INTRAVENOUS | Status: AC
  Administered 2019-08-08: 14:00:00 44 mg via INTRAVENOUS
  Filled 2019-08-08: qty 22

## 2019-08-08 MED ORDER — VINBLASTINE SULFATE CHEMO INJECTION 1 MG/ML
5.8000 mg/m2 | Freq: Once | INTRAVENOUS | Status: AC
  Administered 2019-08-08: 14:00:00 10 mg via INTRAVENOUS
  Filled 2019-08-08: qty 10

## 2019-08-08 MED ORDER — SODIUM CHLORIDE 0.9% FLUSH
10.0000 mL | INTRAVENOUS | Status: DC | PRN
  Administered 2019-08-08: 10 mL
  Filled 2019-08-08: qty 10

## 2019-08-08 MED ORDER — HEPARIN SOD (PORK) LOCK FLUSH 100 UNIT/ML IV SOLN
500.0000 [IU] | Freq: Once | INTRAVENOUS | Status: AC | PRN
  Administered 2019-08-08: 500 [IU]
  Filled 2019-08-08: qty 5

## 2019-08-08 NOTE — Progress Notes (Signed)
Jesse [name] [phone]   [phone]  OFFICE PROGRESS NOTE  Patient, No Pcp Per No address on file  DIAGNOSIS: Stage IVB Hodgkin's Lymphoma, nodular sclerosis subtype. He presented with a bulky left cervical and supraclavicular lymphadenopathy as well as involvement of the spleen, nodal disease in the porta hepatis, and extensive disease involving the marrow of the pelvis, spine, and ribs. He was diagnosed in September 2020.   IPS score: 4   PRIOR THERAPY: None  CURRENT THERAPY: Systemic chemotherapy with brentuximab vedotin 1.2 mg/kg, doxorubicin 25 mg/m2, vinblastine 6 mg/m2, dacarbazine 375 mg/m2 on days 1 and 15 every 28 days. First dose expected on 05/30/2019.   Status post 2 cycles.  He is here today for day 15 of cycle #3.  INTERVAL HISTORY: Jesse [name] 53 y.o. male returns to the clinic today for follow-up visit.  The patient is feeling fine today with no concerning complaints.  He denied having any chest pain, shortness of breath, cough or hemoptysis.  He denied having any recent weight loss or night sweats.  He has no nausea, vomiting, diarrhea or constipation.  He denied having any headache or visual changes.  The patient continues to tolerate his systemic chemotherapy fairly well.  Is here today for evaluation before starting day 15 of cycle #3.  MEDICAL HISTORY: Past Medical History:  Diagnosis Date  . ETOH abuse   . Lymphadenopathy    left  . Marijuana abuse     ALLERGIES:  has No Known Allergies.  MEDICATIONS:  Current Outpatient Medications  Medication Sig Dispense Refill  . acetaminophen (TYLENOL) 500 MG tablet Take 1,000 mg by mouth every 6 (six) hours as needed.    Marland Kitchen allopurinol (ZYLOPRIM) 100 MG tablet Take 1 tablet (100 mg total) by mouth 2 (two) times daily. 60 tablet 2  . ibuprofen (ADVIL) 800 MG tablet Take 800 mg by mouth every 8 (eight) hours as needed.    . lidocaine-prilocaine (EMLA) cream Apply 1  application topically as needed. Apply to port site  45 minutes prior to port being accessed 30 g 1  . oxyCODONE-acetaminophen (PERCOCET/ROXICET) 5-325 MG tablet TAKE 1 TABLET BY MOUTH EVERY 8 HOURS AS NEEDED FOR SEVERE PAIN. 30 tablet 0  . prochlorperazine (COMPAZINE) 10 MG tablet Take 1 tablet (10 mg total) by mouth every 8 (eight) hours as needed for nausea or vomiting. 30 tablet 2   No current facility-administered medications for this visit.    SURGICAL HISTORY:  Past Surgical History:  Procedure Laterality Date  . HERNIA REPAIR     as child  . IR IMAGING GUIDED PORT INSERTION  05/29/2019  . LYMPH NODE BIOPSY Left 05/07/2019   Procedure: LYMPH NODE BIOPSY;  Surgeon: Melida Quitter, MD;  Location: [facility];  Service: ENT;  Laterality: Left;    REVIEW OF SYSTEMS:  A comprehensive review of systems was negative except for: Constitutional: positive for fatigue   PHYSICAL EXAMINATION: General appearance: alert, cooperative and no distress Head: Normocephalic, without obvious abnormality, atraumatic Neck: no adenopathy, no JVD, supple, symmetrical, trachea midline and thyroid not enlarged, symmetric, no tenderness/mass/nodules Lymph nodes: Cervical, supraclavicular, and axillary nodes normal. Resp: clear to auscultation bilaterally Back: symmetric, no curvature. ROM normal. No CVA tenderness. Cardio: regular rate and rhythm, S1, S2 normal, no murmur, click, rub or gallop GI: soft, non-tender; bowel sounds normal; no masses,  no organomegaly Extremities: extremities normal, atraumatic, no cyanosis or edema  ECOG PERFORMANCE STATUS: 1 -  Symptomatic but completely ambulatory  Blood pressure 133/89, pulse 73, temperature 98.3 F (36.8 C), temperature source Temporal, resp. rate 18, height 5\' 6"  (1.676 m), weight 139 lb 3.2 oz (63.1 kg), SpO2 100 %.  LABORATORY DATA: Lab Results  Component Value Date   WBC 7.0 08/08/2019   HGB 9.9 (L) 08/08/2019   HCT 31.3 (L)  08/08/2019   MCV 90.7 08/08/2019   PLT 432 (H) 08/08/2019      Chemistry      Component Value Date/Time   NA 142 07/25/2019 0911   K 4.2 07/25/2019 0911   CL 105 07/25/2019 0911   CO2 28 07/25/2019 0911   BUN 16 07/25/2019 0911   CREATININE 0.82 07/25/2019 0911      Component Value Date/Time   CALCIUM 8.9 07/25/2019 0911   ALKPHOS 192 (H) 07/25/2019 0911   AST 21 07/25/2019 0911   ALT 29 07/25/2019 0911   BILITOT 0.3 07/25/2019 0911       RADIOGRAPHIC STUDIES: No results found.  ASSESSMENT AND PLAN: This is a very pleasant 53 years old African-American male recently diagnosed with a stage IV non-Hodgkin lymphoma, nodular sclerosing subtype.  The patient is currently undergoing systemic chemotherapy with brentuximab Vedotin in addition to doxorubicin, vinblastine and dacarbazine.  Status post 2 cycles.  The patient continues to tolerate his treatment fairly well with no concerning adverse effects. I recommended for him to proceed with day 15 of cycle #3 today as planned. The patient will come back for follow-up visit in 2 weeks for evaluation before starting day 1 of cycle #4 with repeat PET scan for restaging of his disease. He was advised to call immediately if he has any concerning symptoms in the interval. The patient voices understanding of current disease status and treatment options and is in agreement with the current care plan. All questions were answered. The patient knows to call the clinic with any problems, questions or concerns. We can certainly see the patient much sooner if necessary.  I spent 10 minutes counseling the patient face to face. The total time spent in the appointment was 15 minutes.  Disclaimer: This note was dictated with voice recognition software. Similar sounding words can inadvertently be transcribed and may not be corrected upon review.

## 2019-08-08 NOTE — Patient Instructions (Signed)
[facility] Discharge Instructions for Patients Receiving Chemotherapy  Today you received the following chemotherapy agents Doxorubicin (ADRIAMYCIN), Vinblastine (VELBAN), Dacarbazine (DTIC) & Brentuximab (ADCETRIS).  To help prevent nausea and vomiting after your treatment, we encourage you to take your nausea medication as prescribed.   If you develop nausea and vomiting that is not controlled by your nausea medication, call the clinic.   BELOW ARE SYMPTOMS THAT SHOULD BE REPORTED IMMEDIATELY:  *FEVER GREATER THAN 100.5 F  *CHILLS WITH OR WITHOUT FEVER  NAUSEA AND VOMITING THAT IS NOT CONTROLLED WITH YOUR NAUSEA MEDICATION  *UNUSUAL SHORTNESS OF BREATH  *UNUSUAL BRUISING OR BLEEDING  TENDERNESS IN MOUTH AND THROAT WITH OR WITHOUT PRESENCE OF ULCERS  *URINARY PROBLEMS  *BOWEL PROBLEMS  UNUSUAL RASH Items with * indicate a potential emergency and should be followed up as soon as possible.  Feel free to call the clinic should you have any questions or concerns. The clinic phone number is [phone].  Please show the [facility] at check-in to the Emergency Department and triage nurse.  Coronavirus (COVID-19) Are you at risk?  Are you at risk for the Coronavirus (COVID-19)?  To be considered HIGH RISK for Coronavirus (COVID-19), you have to meet the following criteria:  . Traveled to [place], [place], [place], [place] or [place]; or in the [place] to [place], [place], [place], or [place]; and have fever, cough, and shortness of breath within the last 2 weeks of travel OR . Been in close contact with a person diagnosed with COVID-19 within the last 2 weeks and have fever, cough, and shortness of breath . IF YOU DO NOT MEET THESE CRITERIA, YOU ARE CONSIDERED LOW RISK FOR COVID-19.  What to do if you are HIGH RISK for COVID-19?  Marland Kitchen If you are having a medical emergency, call 911. . Seek medical care right away. Before you go to a doctor's  office, urgent care or emergency department, call ahead and tell them about your recent travel, contact with someone diagnosed with COVID-19, and your symptoms. You should receive instructions from your physician's office regarding next steps of care.  . When you arrive at healthcare provider, tell the healthcare staff immediately you have returned from visiting [place], [place], [place], [place] or [place]; or traveled in the [place] to [place], [place], [place], or [place]; in the last two weeks or you have been in close contact with a person diagnosed with COVID-19 in the last 2 weeks.   . Tell the health care staff about your symptoms: fever, cough and shortness of breath. . After you have been seen by a medical provider, you will be either: o Tested for (COVID-19) and discharged home on quarantine except to seek medical care if symptoms worsen, and asked to  - Stay home and avoid contact with others until you get your results (4-5 days)  - Avoid travel on public transportation if possible (such as bus, train, or airplane) or o Sent to the Emergency Department by EMS for evaluation, COVID-19 testing, and possible admission depending on your condition and test results.  What to do if you are LOW RISK for COVID-19?  Reduce your risk of any infection by using the same precautions used for avoiding the common cold or flu:  Marland Kitchen Wash your hands often with soap and warm water for at least 20 seconds.  If soap and water are not readily available, use an alcohol-based hand sanitizer with at least  60% alcohol.  . If coughing or sneezing, cover your mouth and nose by coughing or sneezing into the elbow areas of your shirt or coat, into a tissue or into your sleeve (not your hands). . Avoid shaking hands with others and consider head nods or verbal greetings only. . Avoid touching your eyes, nose, or mouth with unwashed hands.  . Avoid close contact with people who are sick. . Avoid places or  events with large numbers of people in one location, like concerts or sporting events. . Carefully consider travel plans you have or are making. . If you are planning any travel outside or inside the [place], visit the CDC's Travelers' Health webpage for the latest health notices. . If you have some symptoms but not all symptoms, continue to monitor at home and seek medical attention if your symptoms worsen. . If you are having a medical emergency, call 911.   [facility] / e-Visit: [email or web address]         [facility]: [facility]: [id number]                   [facility]: [phone]

## 2019-08-09 ENCOUNTER — Other Ambulatory Visit: Payer: Self-pay | Admitting: *Deleted

## 2019-08-09 ENCOUNTER — Telehealth: Payer: Self-pay | Admitting: Internal Medicine

## 2019-08-09 NOTE — Telephone Encounter (Signed)
Per 12/23 los md stated weekly lab, called md to verify because pt was only scheduled lab every two weeks with treatment.  Per MD okay to keep appts as scheduled.  Lab every two weeks with chemo.

## 2019-08-10 ENCOUNTER — Inpatient Hospital Stay: Payer: Medicaid Other

## 2019-08-10 VITALS — BP 147/88 | HR 67 | Temp 97.8°F | Resp 18

## 2019-08-10 DIAGNOSIS — C8118 Nodular sclerosis classical Hodgkin lymphoma, lymph nodes of multiple sites: Secondary | ICD-10-CM

## 2019-08-10 DIAGNOSIS — Z5112 Encounter for antineoplastic immunotherapy: Secondary | ICD-10-CM | POA: Diagnosis not present

## 2019-08-10 MED ORDER — PEGFILGRASTIM-CBQV 6 MG/0.6ML ~~LOC~~ SOSY
6.0000 mg | PREFILLED_SYRINGE | Freq: Once | SUBCUTANEOUS | Status: AC
  Administered 2019-08-10: 09:00:00 6 mg via SUBCUTANEOUS

## 2019-08-10 NOTE — Patient Instructions (Signed)

## 2019-08-13 ENCOUNTER — Other Ambulatory Visit: Payer: Self-pay

## 2019-08-13 ENCOUNTER — Ambulatory Visit (HOSPITAL_COMMUNITY)
Admission: RE | Admit: 2019-08-13 | Discharge: 2019-08-13 | Disposition: A | Discharge status: Home / Self Care | Payer: Medicaid Other | Source: Ambulatory Visit | Attending: Physician Assistant | Admitting: Physician Assistant

## 2019-08-13 DIAGNOSIS — C8118 Nodular sclerosis classical Hodgkin lymphoma, lymph nodes of multiple sites: Secondary | ICD-10-CM | POA: Diagnosis not present

## 2019-08-13 LAB — GLUCOSE, CAPILLARY: Glucose-Capillary: 82 mg/dL (ref 70–99)

## 2019-08-13 IMAGING — CT NM PET TUM IMG RESTAG (PS) SKULL BASE T - THIGH
7 series · 25 of 25 positions shown · non-contrast
Comparison: PET-CT from [DATE]

CLINICAL DATA: Subsequent treatment strategy for nodular sclerosis
Hodgkin's lymphoma.

EXAM:
NUCLEAR MEDICINE PET SKULL BASE TO THIGH
TECHNIQUE: 6.7 mCi F-18 FDG was injected intravenously. Full-ring PET imaging
was performed from the skull base to thigh after the radiotracer. CT
data was obtained and used for attenuation correction and anatomic
localization.
Fasting blood glucose: 82 mg/dl

[Series 3: pet hn_sk_thigh ac · axial · 5.0mm · 4.07mm/px · z∈[-927,-19]mm · 6 of 228 slices shown]
[im 1/228]
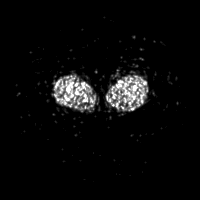
[im 46/228]
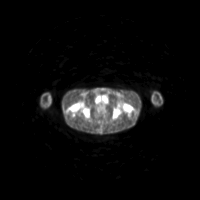
[im 91/228]
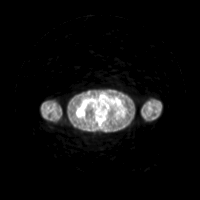
[im 137/228]
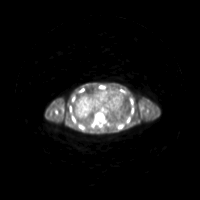
[im 182/228]
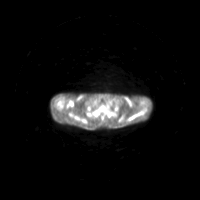
[im 228/228]
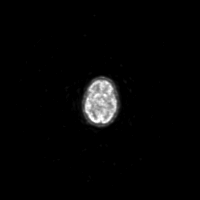

[Series 4: ct hn_sk_th 5.0 hd_fov · axial · 5.0mm · 1.33mm/px · z∈[-927,-19]mm · 5 of 228 slices shown]
[im 1/228]
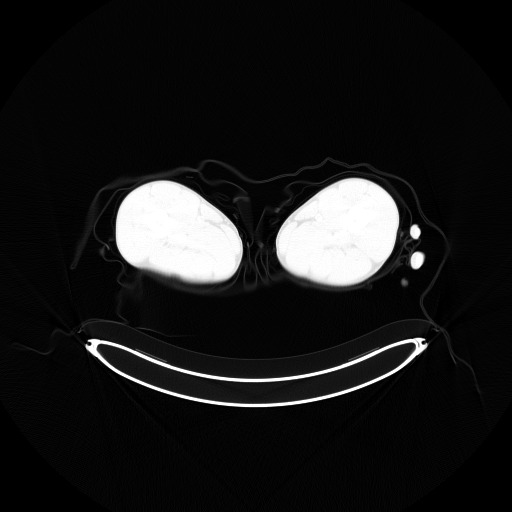
[im 57/228]
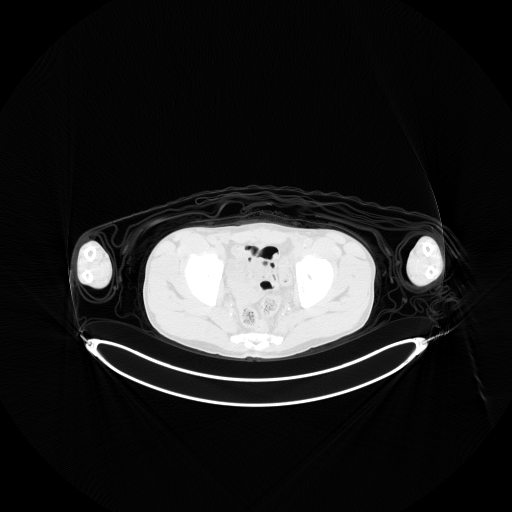
[im 114/228]
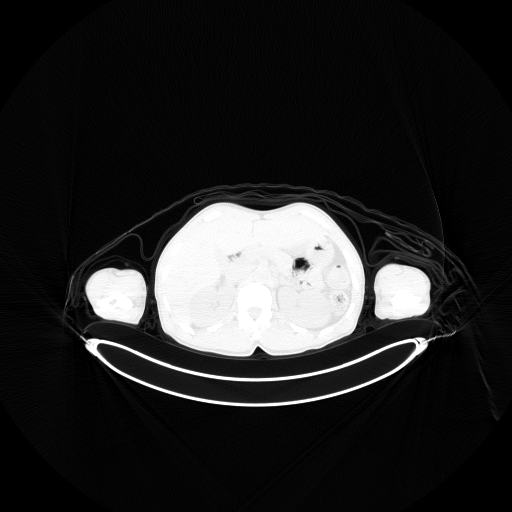
[im 171/228]
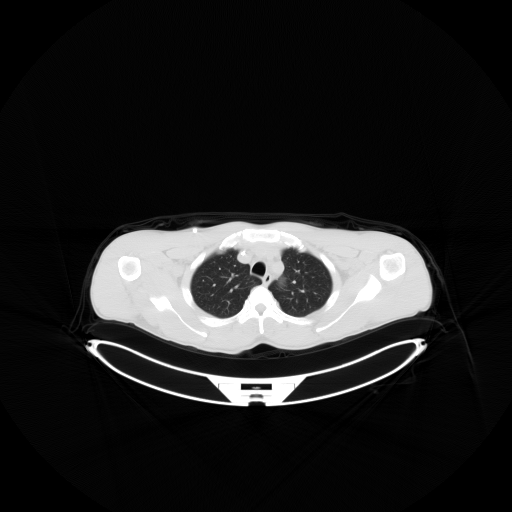
[im 228/228]
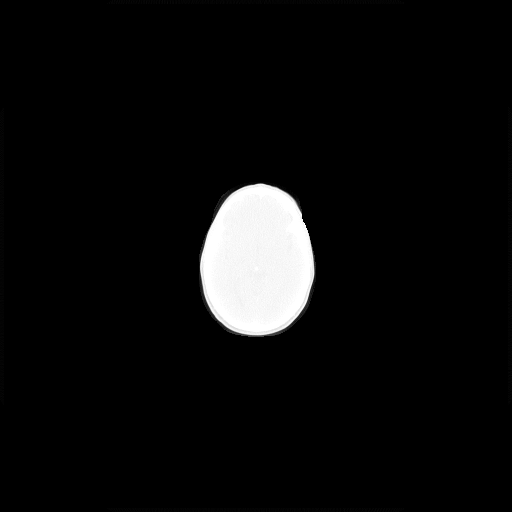

[Series 5: pet hn_sk_thigh nac · axial · 5.0mm · 4.07mm/px · z∈[-927,-19]mm · 5 of 228 slices shown]
[im 1/228]
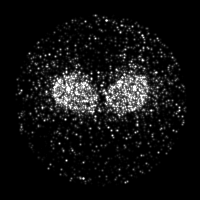
[im 57/228]
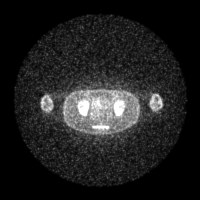
[im 114/228]
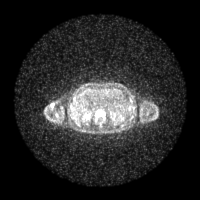
[im 171/228]
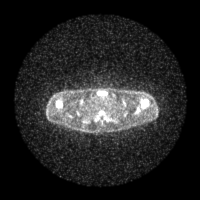
[im 228/228]
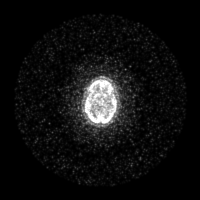

[Series 8: ct hn_sk_th 5.0 b70f (id)_bone · axial · 5.0mm · 0.65mm/px · z∈[-462,-174]mm · 2 of 73 slices shown]
[im 1/73  bone]
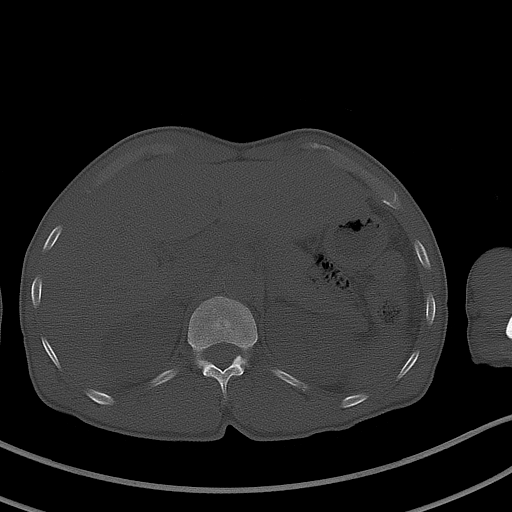
[im 73/73  bone]
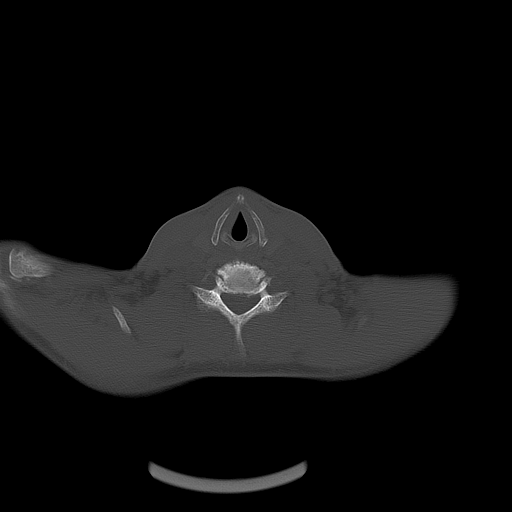

[Series 603: range-ct hn_sk_th 5.0 hd_fov-cor-<alpha range> · 2 of 74 slices shown]
[im 1/74]
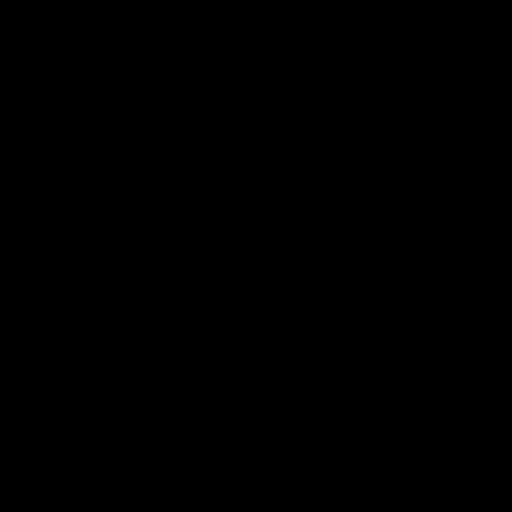
[im 74/74]
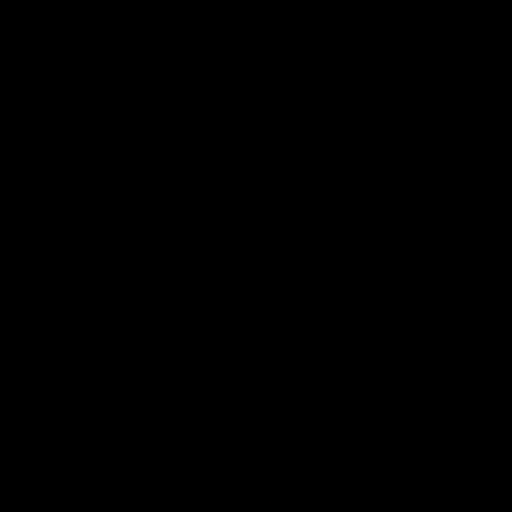

[Series 604: mip range 2 · coronal · 1.88mm/px · 1 of 32 slices shown]
[im 1/32]
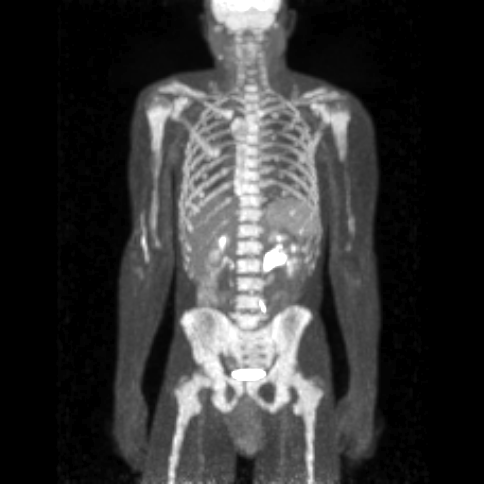

[Series 605: range-ct hn_sk_th 5.0 hd_fov-tra-<alpha range> · 4 of 182 slices shown]
[im 1/182]
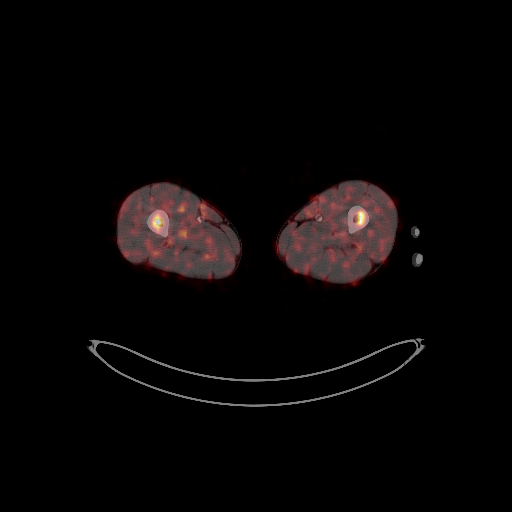
[im 61/182]
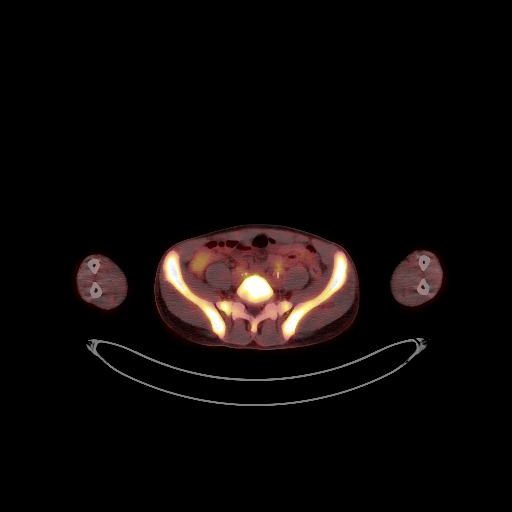
[im 121/182]
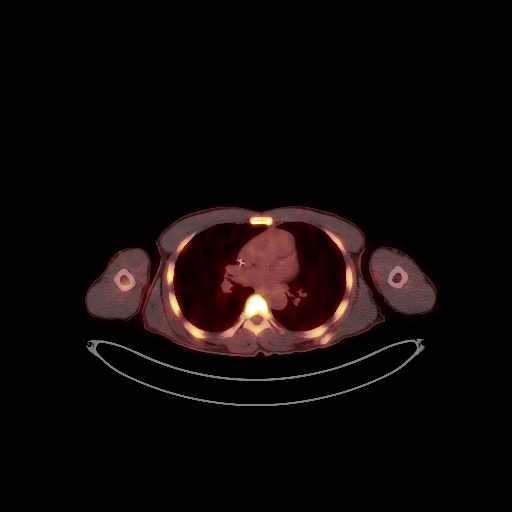
[im 182/182]
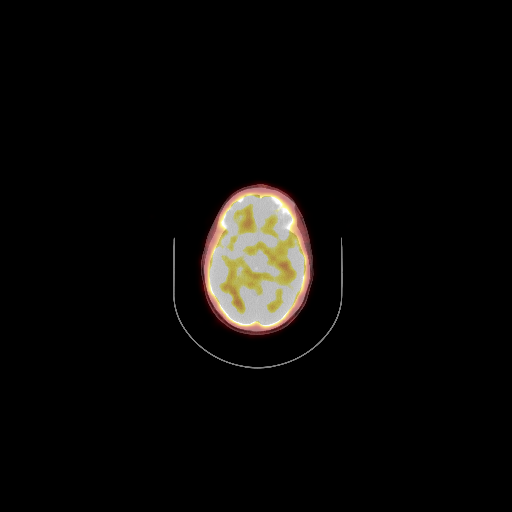

[25 of 25 positions shown; findings below may reference images not displayed]

FINDINGS: Mediastinal blood pool activity: SUV max

Liver activity: SUV max

NECK: The previous extensive hypermetabolic adenopathy in the left
neck is prominently improved, prior conglomerate left level 2 and 3
adenopathy with a maximum SUV of 2.0 (formerly 19.1) and prior level
IV and left supraclavicular adenopathy with a maximum SUV of
(formerly 23.7). The individual lymph nodes in the neck are
difficult to measure due to reduced size and conspicuity, but
probably the conglomerate level 2 adenopathy measures up to about
1.5 cm in diameter, previously 3.1 cm. Similar marked reduction in
the left level IV/supraclavicular lymph nodes.

Incidental CT findings: Mild bilateral common carotid
atherosclerotic calcification.

CHEST: No significant abnormal hypermetabolic activity in this
region.

Incidental CT findings: Right Port-A-Cath tip: SVC. Coronary, aortic
arch, and branch vessel atherosclerotic vascular disease.

ABDOMEN/PELVIS: Previous multifocal hypermetabolic activity in the
spleen has resolved.

Previous hypermetabolic adenopathy in the upper abdomen has
resolved.

Incidental CT findings: Stable hypodense 1.1 cm right hepatic lobe
lesion on image 102/4, not hypermetabolic. Aortoiliac
atherosclerotic vascular disease.

SKELETON: The previous lesions are not conspicuous on the CT data.
The patient previously had extensive multifocal skeletal
involvement, there is currently diffuse skeletal activity likely
from granulocyte stimulation. It is difficult to discern previous
focal hypermetabolic activity against this background. There is some
mild heterogeneity of activity, for example in the L5 vertebra,
which could indicate some residual osseous disease although this is
uncertain, and given the extensive improvement in the soft tissue
disease, my expectation is that the bony disease is likely following
suit.

Incidental CT findings: Mesoacromial os acromiale on the right
incidentally noted.
IMPRESSION: 1. Marked improvement in previous adenopathy in the neck and
abdomen, with resolution of prior hypermetabolic adenopathy and
prominent reduction in size of the previous lymph nodes.
2. Resolution of prior abnormal activity in the spleen.
3. Diffuse accentuated osseous activity likely from granulocyte
stimulation. The previous osseous lesions are difficult to see
against this background. My expectation is that these are probably
markedly improved similar to the soft tissue disease, but the
background high activity throughout the skeleton diffusely on
today's exam makes this less certain.
4.  Aortic Atherosclerosis ([IT]-[IT]).  Coronary atherosclerosis.

## 2019-08-13 MED ORDER — FLUDEOXYGLUCOSE F - 18 (FDG) INJECTION: 6.7000 | Freq: Once | INTRAVENOUS | Status: DC | PRN

## 2019-08-20 ENCOUNTER — Other Ambulatory Visit: Payer: Self-pay | Admitting: *Deleted

## 2019-08-20 ENCOUNTER — Other Ambulatory Visit: Payer: Self-pay | Admitting: Physician Assistant

## 2019-08-20 ENCOUNTER — Other Ambulatory Visit: Payer: Self-pay | Admitting: Internal Medicine

## 2019-08-20 DIAGNOSIS — G893 Neoplasm related pain (acute) (chronic): Secondary | ICD-10-CM

## 2019-08-20 MED ORDER — OXYCODONE-ACETAMINOPHEN 5-325 MG PO TABS: ORAL_TABLET | ORAL | 0 refills | Status: DC

## 2019-08-20 MED FILL — OXYCODONE-APAP 5-325MG: 5-325 | 10 days supply | Qty: 30 | Fill #0

## 2019-08-20 NOTE — Telephone Encounter (Signed)
Jesse [name] states he needs a refill of pain medication due to pain in chest and arm. States ibuprofen does not help the pain

## 2019-08-21 ENCOUNTER — Inpatient Hospital Stay: Payer: Medicaid Other

## 2019-08-21 ENCOUNTER — Other Ambulatory Visit: Payer: Self-pay

## 2019-08-21 ENCOUNTER — Encounter: Payer: Self-pay | Admitting: Physician Assistant

## 2019-08-21 ENCOUNTER — Inpatient Hospital Stay: Payer: Medicaid Other | Attending: Internal Medicine | Admitting: Physician Assistant

## 2019-08-21 VITALS — BP 117/85 | HR 72 | Temp 100.0°F | Resp 18 | Ht 66.0 in | Wt 138.4 lb

## 2019-08-21 DIAGNOSIS — Z5112 Encounter for antineoplastic immunotherapy: Secondary | ICD-10-CM | POA: Insufficient documentation

## 2019-08-21 DIAGNOSIS — R5383 Other fatigue: Secondary | ICD-10-CM | POA: Diagnosis not present

## 2019-08-21 DIAGNOSIS — Z79899 Other long term (current) drug therapy: Secondary | ICD-10-CM | POA: Insufficient documentation

## 2019-08-21 DIAGNOSIS — C8118 Nodular sclerosis classical Hodgkin lymphoma, lymph nodes of multiple sites: Secondary | ICD-10-CM | POA: Diagnosis present

## 2019-08-21 DIAGNOSIS — I700 Atherosclerosis of aorta: Secondary | ICD-10-CM | POA: Insufficient documentation

## 2019-08-21 DIAGNOSIS — Z87891 Personal history of nicotine dependence: Secondary | ICD-10-CM | POA: Diagnosis not present

## 2019-08-21 DIAGNOSIS — M62838 Other muscle spasm: Secondary | ICD-10-CM | POA: Insufficient documentation

## 2019-08-21 DIAGNOSIS — Z95828 Presence of other vascular implants and grafts: Secondary | ICD-10-CM

## 2019-08-21 DIAGNOSIS — Z5189 Encounter for other specified aftercare: Secondary | ICD-10-CM | POA: Insufficient documentation

## 2019-08-21 DIAGNOSIS — Z5111 Encounter for antineoplastic chemotherapy: Secondary | ICD-10-CM

## 2019-08-21 DIAGNOSIS — C8111 Nodular sclerosis classical Hodgkin lymphoma, lymph nodes of head, face, and neck: Secondary | ICD-10-CM

## 2019-08-21 DIAGNOSIS — I2510 Atherosclerotic heart disease of native coronary artery without angina pectoris: Secondary | ICD-10-CM | POA: Insufficient documentation

## 2019-08-21 DIAGNOSIS — R0789 Other chest pain: Secondary | ICD-10-CM | POA: Diagnosis not present

## 2019-08-21 DIAGNOSIS — G893 Neoplasm related pain (acute) (chronic): Secondary | ICD-10-CM

## 2019-08-21 DIAGNOSIS — M542 Cervicalgia: Secondary | ICD-10-CM | POA: Diagnosis not present

## 2019-08-21 LAB — CMP (CANCER CENTER ONLY)
ALT: 10 U/L (ref 0–44)
AST: 12 U/L — ABNORMAL LOW (ref 15–41)
Albumin: 3.7 g/dL (ref 3.5–5.0)
Alkaline Phosphatase: 160 U/L — ABNORMAL HIGH (ref 38–126)
Anion gap: 9 (ref 5–15)
BUN: 16 mg/dL (ref 6–20)
CO2: 25 mmol/L (ref 22–32)
Calcium: 8.8 mg/dL — ABNORMAL LOW (ref 8.9–10.3)
Chloride: 106 mmol/L (ref 98–111)
Creatinine: 0.7 mg/dL (ref 0.61–1.24)
GFR, Est AFR Am: >60 mL/min (ref >60)
GFR, Estimated: >60 mL/min (ref >60)
Glucose, Bld: 92 mg/dL (ref 70–99)
Potassium: 4 mmol/L (ref 3.5–5.1)
Sodium: 140 mmol/L (ref 135–145)
Total Bilirubin: 0.2 mg/dL — ABNORMAL LOW (ref 0.3–1.2)
Total Protein: 7.1 g/dL (ref 6.5–8.1)

## 2019-08-21 LAB — CBC WITH DIFFERENTIAL (CANCER CENTER ONLY)
Abs Immature Granulocytes: 0.86 10*3/uL — ABNORMAL HIGH (ref 0.00–0.07)
Basophils Absolute: 0.1 10*3/uL (ref 0.0–0.1)
Basophils Relative: 0 %
Eosinophils Absolute: 0.1 10*3/uL (ref 0.0–0.5)
Eosinophils Relative: 0 %
HCT: 32.2 % — ABNORMAL LOW (ref 39.0–52.0)
Hemoglobin: 10.5 g/dL — ABNORMAL LOW (ref 13.0–17.0)
Immature Granulocytes: 6 %
Lymphocytes Relative: 9 %
Lymphs Abs: 1.3 10*3/uL (ref 0.7–4.0)
MCH: 29.4 pg (ref 26.0–34.0)
MCHC: 32.6 g/dL (ref 30.0–36.0)
MCV: 90.2 fL (ref 80.0–100.0)
Monocytes Absolute: 1.3 10*3/uL — ABNORMAL HIGH (ref 0.1–1.0)
Monocytes Relative: 9 %
Neutro Abs: 10.5 10*3/uL — ABNORMAL HIGH (ref 1.7–7.7)
Neutrophils Relative %: 76 %
Platelet Count: 419 10*3/uL — ABNORMAL HIGH (ref 150–400)
RBC: 3.57 MIL/uL — ABNORMAL LOW (ref 4.22–5.81)
RDW: 17.4 % — ABNORMAL HIGH (ref 11.5–15.5)
WBC Count: 14 10*3/uL — ABNORMAL HIGH (ref 4.0–10.5)
nRBC: 0 % (ref 0.0–0.2)

## 2019-08-21 LAB — LACTATE DEHYDROGENASE: LDH: 209 U/L — ABNORMAL HIGH (ref 98–192)

## 2019-08-21 MED ORDER — DIPHENHYDRAMINE HCL 50 MG/ML IJ SOLN
INTRAMUSCULAR | Status: AC
  Filled 2019-08-21: qty 1

## 2019-08-21 MED ORDER — HEPARIN SOD (PORK) LOCK FLUSH 100 UNIT/ML IV SOLN
500.0000 [IU] | Freq: Once | INTRAVENOUS | Status: AC | PRN
  Administered 2019-08-21: 500 [IU]
  Filled 2019-08-21: qty 5

## 2019-08-21 MED ORDER — DOXORUBICIN HCL CHEMO IV INJECTION 2 MG/ML
25.0000 mg/m2 | Freq: Once | INTRAVENOUS | Status: AC
  Administered 2019-08-21: 44 mg via INTRAVENOUS
  Filled 2019-08-21: qty 22

## 2019-08-21 MED ORDER — SODIUM CHLORIDE 0.9 % IV SOLN
375.0000 mg/m2 | Freq: Once | INTRAVENOUS | Status: AC
  Administered 2019-08-21: 14:00:00 650 mg via INTRAVENOUS
  Filled 2019-08-21: qty 65

## 2019-08-21 MED ORDER — SODIUM CHLORIDE 0.9% FLUSH
10.0000 mL | INTRAVENOUS | Status: DC | PRN
  Administered 2019-08-21: 16:00:00 10 mL
  Filled 2019-08-21: qty 10

## 2019-08-21 MED ORDER — SODIUM CHLORIDE 0.9% FLUSH
10.0000 mL | INTRAVENOUS | Status: DC | PRN
  Administered 2019-08-21: 10 mL
  Filled 2019-08-21: qty 10

## 2019-08-21 MED ORDER — DIPHENHYDRAMINE HCL 50 MG/ML IJ SOLN
50.0000 mg | Freq: Once | INTRAMUSCULAR | Status: AC
  Administered 2019-08-21: 50 mg via INTRAVENOUS

## 2019-08-21 MED ORDER — PALONOSETRON HCL INJECTION 0.25 MG/5ML
INTRAVENOUS | Status: AC
  Filled 2019-08-21: qty 5

## 2019-08-21 MED ORDER — VINBLASTINE SULFATE CHEMO INJECTION 1 MG/ML
5.8000 mg/m2 | Freq: Once | INTRAVENOUS | Status: AC
  Administered 2019-08-21: 14:00:00 10 mg via INTRAVENOUS
  Filled 2019-08-21: qty 10

## 2019-08-21 MED ORDER — SODIUM CHLORIDE 0.9 % IV SOLN
Freq: Once | INTRAVENOUS | Status: AC
  Filled 2019-08-21: qty 5

## 2019-08-21 MED ORDER — SODIUM CHLORIDE 0.9 % IV SOLN
Freq: Once | INTRAVENOUS | Status: AC
  Filled 2019-08-21: qty 250

## 2019-08-21 MED ORDER — ACETAMINOPHEN 325 MG PO TABS
ORAL_TABLET | ORAL | Status: AC
  Filled 2019-08-21: qty 2

## 2019-08-21 MED ORDER — ACETAMINOPHEN 325 MG PO TABS
650.0000 mg | ORAL_TABLET | Freq: Once | ORAL | Status: AC
  Administered 2019-08-21: 12:00:00 650 mg via ORAL

## 2019-08-21 MED ORDER — SODIUM CHLORIDE 0.9 % IV SOLN
1.2000 mg/kg | Freq: Once | INTRAVENOUS | Status: AC
  Administered 2019-08-21: 15:00:00 75 mg via INTRAVENOUS
  Filled 2019-08-21: qty 15

## 2019-08-21 MED ORDER — PALONOSETRON HCL INJECTION 0.25 MG/5ML
0.2500 mg | Freq: Once | INTRAVENOUS | Status: AC
  Administered 2019-08-21: 12:00:00 0.25 mg via INTRAVENOUS

## 2019-08-21 NOTE — Progress Notes (Signed)
[facility] OFFICE PROGRESS NOTE  Patient, No Pcp Per No address on file  DIAGNOSIS: Stage IVB Hodgkin's Lymphoma, nodular sclerosis subtype. He presented with a bulky left cervical and supraclavicular lymphadenopathy as well as involvement of the spleen, nodal disease in the porta hepatis, and extensive disease involving the marrow of the pelvis, spine, and ribs. He was diagnosed in September 2020.   IPS score: 4  PRIOR THERAPY: None  CURRENT THERAPY: Systemic chemotherapy with brentuximab vedotin 1.2 mg/kg, doxorubicin 25 mg/m2, vinblastine 6 mg/m2, dacarbazine 375 mg/m2 on days 1 and 15 every 28 days. First dose expected on 05/30/2019.Status post 3 cycles.  INTERVAL HISTORY: Tayvion Pessin Bekele 54 y.o. male returns to the clinic for a follow up visit. The patient is feeling well today without any concerning complaints except for chest discomfort. He states that starting on Friday, he started experiencing chest discomfort while laying down. The pain was non-exertional. He also experienced associated discomfort in the left side of his neck that radiated down his left arm. He also mentions some neck left neck muscle spasms and soreness to rotation of his neck to the left. He states to try to alleviate his pain he walked around and tried to stretch which helped his pain in addition to using oxycodone. His pain was the worse on Saturday and was reportedly 10/10. He denies any associated diaphoresis, nausea, vomiting, or shortness of breath. He denies any traumas or injuries to his neck. Denies any calf pain or swelling. He characterizes his pain feels like a combination of a sharp "nerve" pain, throbbing pain, cramping, and a "grabbing" holding pain. The patient denies any cardiac history; however, he does not have a primary care provider and does not have significant past medical history per chart review. He is a former cigarette smoker. He denies chest pain at this time.   He  tolerated his last treatment well without any adverse side effects.  Denies any fever, chills, night sweats, or weight loss. His appetite has been good recently. Denies any shortness of breath, cough, or hemoptysis. Denies any nausea, vomiting, diarrhea, or constipation. Denies any headache or visual changes. Denies any rashes or skin changes. Denies any numbness or tingling in his hands or feet. The patient is here today for evaluation prior to starting cycle #4    MEDICAL HISTORY: Past Medical History:  Diagnosis Date  . ETOH abuse   . Lymphadenopathy    left  . Marijuana abuse     ALLERGIES:  has No Known Allergies.  MEDICATIONS:  Current Outpatient Medications  Medication Sig Dispense Refill  . acetaminophen (TYLENOL) 500 MG tablet Take 1,000 mg by mouth every 6 (six) hours as needed.    Marland Kitchen allopurinol (ZYLOPRIM) 100 MG tablet Take 1 tablet (100 mg total) by mouth 2 (two) times daily. 60 tablet 2  . ibuprofen (ADVIL) 800 MG tablet Take 800 mg by mouth every 8 (eight) hours as needed.    . lidocaine-prilocaine (EMLA) cream Apply 1 application topically as needed. Apply to port site  45 minutes prior to port being accessed 30 g 1  . oxyCODONE-acetaminophen (PERCOCET/ROXICET) 5-325 MG tablet TAKE 1 TABLET BY MOUTH EVERY 8 HOURS AS NEEDED FOR SEVERE PAIN. 30 tablet 0  . prochlorperazine (COMPAZINE) 10 MG tablet Take 1 tablet (10 mg total) by mouth every 8 (eight) hours as needed for nausea or vomiting. 30 tablet 2   No current facility-administered medications for this visit.   Facility-Administered Medications Ordered in Other  Visits  Medication Dose Route Frequency Provider Last Rate Last Admin  . brentuximab vedotin (ADCETRIS) 75 mg in sodium chloride 0.9 % 100 mL chemo infusion  1.2 mg/kg (Treatment Plan Recorded) Intravenous Once Curt Bears, MD      . dacarbazine (DTIC) 650 mg in sodium chloride 0.9 % 250 mL chemo infusion  375 mg/m2 (Treatment Plan Recorded) Intravenous Once  Curt Bears, MD      . DOXOrubicin (ADRIAMYCIN) chemo injection 44 mg  25 mg/m2 (Treatment Plan Recorded) Intravenous Once Curt Bears, MD   44 mg at 08/21/19 1337  . heparin lock flush 100 unit/mL  500 Units Intracatheter Once PRN Curt Bears, MD      . sodium chloride flush (NS) 0.9 % injection 10 mL  10 mL Intracatheter PRN Curt Bears, MD      . vinBLAStine (VELBAN) 10 mg in sodium chloride 0.9 % 50 mL chemo infusion  5.8 mg/m2 (Treatment Plan Recorded) Intravenous Once Curt Bears, MD        SURGICAL HISTORY:  Past Surgical History:  Procedure Laterality Date  . HERNIA REPAIR     as child  . IR IMAGING GUIDED PORT INSERTION  05/29/2019  . LYMPH NODE BIOPSY Left 05/07/2019   Procedure: LYMPH NODE BIOPSY;  Surgeon: Melida Quitter, MD;  Location: [facility];  Service: ENT;  Laterality: Left;    REVIEW OF SYSTEMS:   Review of Systems  Constitutional: Negative for appetite change, chills, fatigue, fever and unexpected weight change.  HENT:   Negative for mouth sores, nosebleeds, sore throat and trouble swallowing.   Eyes: Negative for eye problems and icterus.  Respiratory: Negative for cough, hemoptysis, shortness of breath and wheezing.  Cardiovascular: Positive for intermittent chest discomfort. Negative for leg swelling.  Gastrointestinal: Negative for abdominal pain, constipation, diarrhea, nausea and vomiting.  Genitourinary: Negative for bladder incontinence, difficulty urinating, dysuria, frequency and hematuria.   Musculoskeletal: Negative for back pain, gait problem, neck pain and neck stiffness.  Skin: Negative for itching and rash.  Neurological: Negative for dizziness, extremity weakness, gait problem, headaches, light-headedness and seizures.  Hematological: Negative for adenopathy. Does not bruise/bleed easily.  Psychiatric/Behavioral: Negative for confusion, depression and sleep disturbance. The patient is not nervous/anxious.      PHYSICAL EXAMINATION:  Blood pressure 117/85, pulse 72, temperature 100 F (37.8 C), temperature source Temporal, resp. rate 18, height 5\' 6"  (1.676 m), weight 138 lb 6.4 oz (62.8 kg), SpO2 99 %.  ECOG PERFORMANCE STATUS: 1 - Symptomatic but completely ambulatory  Physical Exam  Constitutional: Oriented to person, place, and time and well-developed, well-nourished, and in no distress.  HENT:  Head: Normocephalic and atraumatic.  Mouth/Throat: Oropharynx is clear and moist. No oropharyngeal exudate.  Eyes: Conjunctivae are normal. Right eye exhibits no discharge. Left eye exhibits no discharge. No scleral icterus.  Neck: Normal range of motion. Neck supple.  Cardiovascular: Normal rate, regular rhythm, normal heart sounds and intact distal pulses.   Pulmonary/Chest: Effort normal and breath sounds normal. No respiratory distress. No wheezes. No rales.  Abdominal: Soft. Bowel sounds are normal. Exhibits no distension and no mass. There is no tenderness.  Musculoskeletal: Normal range of motion but soreness to palpation of his left shoulder. Mild discomfort with rotation of his neck to the left. Exhibits no edema.  Lymphadenopathy:    No cervical adenopathy.  Neurological: Alert and oriented to person, place, and time. Exhibits normal muscle tone. Gait normal. Coordination normal.  Skin: Skin is warm and dry. No  rash noted. Not diaphoretic. No erythema. No pallor.  Psychiatric: Mood, memory and judgment normal.  Vitals reviewed.  LABORATORY DATA: Lab Results  Component Value Date   WBC 14.0 (H) 08/21/2019   HGB 10.5 (L) 08/21/2019   HCT 32.2 (L) 08/21/2019   MCV 90.2 08/21/2019   PLT 419 (H) 08/21/2019      Chemistry      Component Value Date/Time   NA 140 08/21/2019 0957   K 4.0 08/21/2019 0957   CL 106 08/21/2019 0957   CO2 25 08/21/2019 0957   BUN 16 08/21/2019 0957   CREATININE 0.70 08/21/2019 0957      Component Value Date/Time   CALCIUM 8.8 (L) 08/21/2019 0957    ALKPHOS 160 (H) 08/21/2019 0957   AST 12 (L) 08/21/2019 0957   ALT 10 08/21/2019 0957   BILITOT 0.2 (L) 08/21/2019 0957       RADIOGRAPHIC STUDIES:  NM PET Image Restag (PS) Skull Base To Thigh  Result Date: 08/13/2019 CLINICAL DATA:  Subsequent treatment strategy for nodular sclerosis Hodgkin's lymphoma. EXAM: NUCLEAR MEDICINE PET SKULL BASE TO THIGH TECHNIQUE: 6.7 mCi F-18 FDG was injected intravenously. Full-ring PET imaging was performed from the skull base to thigh after the radiotracer. CT data was obtained and used for attenuation correction and anatomic localization. Fasting blood glucose: 82 mg/dl COMPARISON:  PET-CT from 05/21/2019 FINDINGS: Mediastinal blood pool activity: SUV max 1.6 Liver activity: SUV max 2.5 NECK: The previous extensive hypermetabolic adenopathy in the left neck is prominently improved, prior conglomerate left level 2 and 3 adenopathy with a maximum SUV of 2.0 (formerly 19.1) and prior level IV and left supraclavicular adenopathy with a maximum SUV of 1.5 (formerly 23.7). The individual lymph nodes in the neck are difficult to measure due to reduced size and conspicuity, but probably the conglomerate level 2 adenopathy measures up to about 1.5 cm in diameter, previously 3.1 cm. Similar marked reduction in the left level IV/supraclavicular lymph nodes. Incidental CT findings: Mild bilateral common carotid atherosclerotic calcification. CHEST: No significant abnormal hypermetabolic activity in this region. Incidental CT findings: Right Port-A-Cath tip: SVC. Coronary, aortic arch, and branch vessel atherosclerotic vascular disease. ABDOMEN/PELVIS: Previous multifocal hypermetabolic activity in the spleen has resolved. Previous hypermetabolic adenopathy in the upper abdomen has resolved. Incidental CT findings: Stable hypodense 1.1 cm right hepatic lobe lesion on image 102/4, not hypermetabolic. Aortoiliac atherosclerotic vascular disease. SKELETON: The previous lesions are  not conspicuous on the CT data. The patient previously had extensive multifocal skeletal involvement, there is currently diffuse skeletal activity likely from granulocyte stimulation. It is difficult to discern previous focal hypermetabolic activity against this background. There is some mild heterogeneity of activity, for example in the L5 vertebra, which could indicate some residual osseous disease although this is uncertain, and given the extensive improvement in the soft tissue disease, my expectation is that the bony disease is likely following suit. Incidental CT findings: Mesoacromial os acromiale on the right incidentally noted. IMPRESSION: 1. Marked improvement in previous adenopathy in the neck and abdomen, with resolution of prior hypermetabolic adenopathy and prominent reduction in size of the previous lymph nodes. 2. Resolution of prior abnormal activity in the spleen. 3. Diffuse accentuated osseous activity likely from granulocyte stimulation. The previous osseous lesions are difficult to see against this background. My expectation is that these are probably markedly improved similar to the soft tissue disease, but the background high activity throughout the skeleton diffusely on today's exam makes this less certain. 4.  Aortic Atherosclerosis (  ICD10-I70.0).  Coronary atherosclerosis. Electronically Signed   By: Van Clines M.D.   On: 08/13/2019 14:40     ASSESSMENT/PLAN:  This is a very pleasant 54 years old African-American male recently diagnosed with a stage IV non-Hodgkin lymphoma, nodular sclerosing subtype. He presented with a bulky left cervical and supraclavicular lymphadenopathy as well as involvement of the spleen, nodal disease in the porta hepatis, and extensive disease involving the marrow of the pelvis, spine, and ribs. He was diagnosed in September 2020.  The patient is currently undergoing systemic chemotherapy with brentuximab Vedotin in addition to doxorubicin,  vinblastine and dacarbazine. Status post3 cycles. He has been tolerating his treatment fairly well.  The patient was seen with Dr. Julien Nordmann today. The patient recently had a PET scan performed. Dr. Julien Nordmann personally and independently reviewed the scan and discussed the results with the patient today. The scan shows a response to treatment with marked improvement in his adenopathy and prior abnormal activity in the spleen.   Dr. Julien Nordmann recommends that he proceed with cycle #4 as scheduled. The plan is to receive 6 cycles of this treatment with a restaging CT scan after cycle #6.   We will see him back for a follow up visit in 2 weeks for evaluation before starting day 14 cycle #4.   For his chest discomfort, he was advised to go to the emergency room if he experiences similar symptoms in the future to rule out a serious life threatening etiology. We will perform an EKG while in the clinic today. His vitals are WNL. Denies chest pain at this time.   I have placed a referral to establish care with a PCP. Discussed the importance of having a primary care provider to manage other medical problems as well as ensure his is up to date on his health maintenance.   Regarding his pain, discussed that I would like to begin having him taper off his oxycodone and transition to using tylenol for pain control. He expressed understanding.   The patient was advised to call immediately if he has any concerning symptoms in the interval. The patient voices understanding of current disease status and treatment options and is in agreement with the current care plan. All questions were answered. The patient knows to call the clinic with any problems, questions or concerns. We can certainly see the patient much sooner if necessary   Orders Placed This Encounter  Procedures  . Ambulatory referral to Internal Medicine    Referral Priority:   Routine    Referral Type:   Consultation    Referral Reason:   Specialty  Services Required    Requested Specialty:   Internal Medicine    Number of Visits Requested:   1  . EKG 12-Lead     Jaze Rodino L Gerda Yin, PA-C 08/21/19  ADDENDUM: Hematology/Oncology Attending: I had a face-to-face encounter with the patient today.  I recommended his care plan.  This is a very pleasant 55 years old African-American male diagnosed with bulky stage IV Hodgkin's lymphoma, nodular sclerosing subtype in September 2020.  The patient is currently undergoing systemic chemotherapy with brentuximab Vedotin in addition to doxorubicin, vinblastine and dacarbazine status post 3 cycles.  The patient has been tolerating this treatment well with no concerning adverse effect except for fatigue.  He has been complaining of intermittent pain on the right side of the chest but no associated shortness of breath, diaphoresis, nausea or vomiting.  He has no history of cardiac abnormalities. The patient had  repeat PET scan performed recently.  I personally and independently reviewed the PET scan images and discussed the result and showed the images to the patient today.  The patient had significant improvement in his disease with reduction and the bulky lymphadenopathy. I recommended for him to proceed with 3 more cycles of the same regimen and he will start cycle #4 today. For the intermittent chest pain, he was advised to take Tylenol and occasional ibuprofen.  He also has Percocet to be used only if absolutely needed. The patient will come back for follow-up visit in 2 weeks for evaluation before starting day 15 of cycle #4. He was advised to call immediately if he has any concerning symptoms in the interval.  Disclaimer: This note was dictated with voice recognition software. Similar sounding words can inadvertently be transcribed and may be missed upon review. Eilleen Kempf, MD 08/21/19

## 2019-08-21 NOTE — Patient Instructions (Signed)
[facility] Discharge Instructions for Patients Receiving Chemotherapy  Today you received the following chemotherapy agents Adriamycin, Velban, DTIC and Adcentris  To help prevent nausea and vomiting after your treatment, we encourage you to take your nausea medication as directed.    If you develop nausea and vomiting that is not controlled by your nausea medication, call the clinic.   BELOW ARE SYMPTOMS THAT SHOULD BE REPORTED IMMEDIATELY:  *FEVER GREATER THAN 100.5 F  *CHILLS WITH OR WITHOUT FEVER  NAUSEA AND VOMITING THAT IS NOT CONTROLLED WITH YOUR NAUSEA MEDICATION  *UNUSUAL SHORTNESS OF BREATH  *UNUSUAL BRUISING OR BLEEDING  TENDERNESS IN MOUTH AND THROAT WITH OR WITHOUT PRESENCE OF ULCERS  *URINARY PROBLEMS  *BOWEL PROBLEMS  UNUSUAL RASH Items with * indicate a potential emergency and should be followed up as soon as possible.  Feel free to call the clinic should you have any questions or concerns. The clinic phone number is [phone].  Please show the [facility] at check-in to the Emergency Department and triage nurse.

## 2019-08-21 NOTE — Patient Instructions (Signed)

## 2019-08-23 ENCOUNTER — Encounter: Payer: Self-pay | Admitting: *Deleted

## 2019-08-23 ENCOUNTER — Inpatient Hospital Stay: Payer: Medicaid Other

## 2019-08-23 ENCOUNTER — Other Ambulatory Visit: Payer: Self-pay

## 2019-08-23 ENCOUNTER — Telehealth: Payer: Self-pay | Admitting: Physician Assistant

## 2019-08-23 VITALS — BP 130/81 | HR 70 | Temp 98.3°F | Resp 20

## 2019-08-23 DIAGNOSIS — Z95828 Presence of other vascular implants and grafts: Secondary | ICD-10-CM

## 2019-08-23 DIAGNOSIS — Z5112 Encounter for antineoplastic immunotherapy: Secondary | ICD-10-CM | POA: Diagnosis not present

## 2019-08-23 DIAGNOSIS — C8118 Nodular sclerosis classical Hodgkin lymphoma, lymph nodes of multiple sites: Secondary | ICD-10-CM

## 2019-08-23 MED ORDER — PEGFILGRASTIM-CBQV 6 MG/0.6ML ~~LOC~~ SOSY
6.0000 mg | PREFILLED_SYRINGE | Freq: Once | SUBCUTANEOUS | Status: AC
  Administered 2019-08-23: 6 mg via SUBCUTANEOUS

## 2019-08-23 MED ORDER — PEGFILGRASTIM-CBQV 6 MG/0.6ML ~~LOC~~ SOSY
PREFILLED_SYRINGE | SUBCUTANEOUS | Status: AC
  Filled 2019-08-23: qty 0.6

## 2019-08-23 MED ORDER — SODIUM CHLORIDE 0.9% FLUSH
10.0000 mL | INTRAVENOUS | Status: DC | PRN
  Filled 2019-08-23: qty 10

## 2019-08-23 NOTE — Telephone Encounter (Signed)
Scheduled appt per 1/5 los - pt to get an updated schedule next visit .

## 2019-08-23 NOTE — Patient Instructions (Signed)

## 2019-08-23 NOTE — Progress Notes (Signed)
[facility] Work  Clinical Social Work returned [employer] with questions regarding Medicaid.  Patient's spouse states she has been in contact with DSS and has provided new information, has no other questions. She reported they are living in [place], [place] right now with their son, since patient has been unable to work- she believes he used all his Chemical [employer] for gas cards. CSW informed them he would qualify for gas cards through [place] program.  CSW will meet with patient at next infusion.  Gwinda Maine, LCSW  Clinical Social Worker [facility]

## 2019-09-05 ENCOUNTER — Encounter: Payer: Self-pay | Admitting: *Deleted

## 2019-09-05 ENCOUNTER — Ambulatory Visit: Payer: Medicaid Other | Admitting: Nutrition

## 2019-09-05 ENCOUNTER — Inpatient Hospital Stay (HOSPITAL_BASED_OUTPATIENT_CLINIC_OR_DEPARTMENT_OTHER): Payer: Medicaid Other | Admitting: Internal Medicine

## 2019-09-05 ENCOUNTER — Encounter: Payer: Self-pay | Admitting: Internal Medicine

## 2019-09-05 ENCOUNTER — Other Ambulatory Visit: Payer: Self-pay

## 2019-09-05 ENCOUNTER — Inpatient Hospital Stay: Payer: Medicaid Other

## 2019-09-05 VITALS — BP 121/82 | HR 89 | Temp 97.7°F | Resp 18 | Ht 66.0 in | Wt 133.1 lb

## 2019-09-05 DIAGNOSIS — G893 Neoplasm related pain (acute) (chronic): Secondary | ICD-10-CM

## 2019-09-05 DIAGNOSIS — Z5112 Encounter for antineoplastic immunotherapy: Secondary | ICD-10-CM | POA: Diagnosis not present

## 2019-09-05 DIAGNOSIS — Z95828 Presence of other vascular implants and grafts: Secondary | ICD-10-CM

## 2019-09-05 DIAGNOSIS — Z5111 Encounter for antineoplastic chemotherapy: Secondary | ICD-10-CM

## 2019-09-05 DIAGNOSIS — C8118 Nodular sclerosis classical Hodgkin lymphoma, lymph nodes of multiple sites: Secondary | ICD-10-CM

## 2019-09-05 LAB — CMP (CANCER CENTER ONLY)
ALT: 10 U/L (ref 0–44)
AST: 12 U/L — ABNORMAL LOW (ref 15–41)
Albumin: 3.8 g/dL (ref 3.5–5.0)
Alkaline Phosphatase: 163 U/L — ABNORMAL HIGH (ref 38–126)
Anion gap: 9 (ref 5–15)
BUN: 17 mg/dL (ref 6–20)
CO2: 24 mmol/L (ref 22–32)
Calcium: 8.7 mg/dL — ABNORMAL LOW (ref 8.9–10.3)
Chloride: 105 mmol/L (ref 98–111)
Creatinine: 0.8 mg/dL (ref 0.61–1.24)
GFR, Est AFR Am: >60 mL/min (ref >60)
GFR, Estimated: >60 mL/min (ref >60)
Glucose, Bld: 121 mg/dL — ABNORMAL HIGH (ref 70–99)
Potassium: 4 mmol/L (ref 3.5–5.1)
Sodium: 138 mmol/L (ref 135–145)
Total Bilirubin: 0.3 mg/dL (ref 0.3–1.2)
Total Protein: 7.5 g/dL (ref 6.5–8.1)

## 2019-09-05 LAB — CBC WITH DIFFERENTIAL (CANCER CENTER ONLY)
Abs Immature Granulocytes: 0.4 10*3/uL — ABNORMAL HIGH (ref 0.00–0.07)
Basophils Absolute: 0 10*3/uL (ref 0.0–0.1)
Basophils Relative: 0 %
Eosinophils Absolute: 0.1 10*3/uL (ref 0.0–0.5)
Eosinophils Relative: 1 %
HCT: 36.6 % — ABNORMAL LOW (ref 39.0–52.0)
Hemoglobin: 11.7 g/dL — ABNORMAL LOW (ref 13.0–17.0)
Immature Granulocytes: 3 %
Lymphocytes Relative: 11 %
Lymphs Abs: 1.3 10*3/uL (ref 0.7–4.0)
MCH: 29 pg (ref 26.0–34.0)
MCHC: 32 g/dL (ref 30.0–36.0)
MCV: 90.8 fL (ref 80.0–100.0)
Monocytes Absolute: 1 10*3/uL (ref 0.1–1.0)
Monocytes Relative: 9 %
Neutro Abs: 8.9 10*3/uL — ABNORMAL HIGH (ref 1.7–7.7)
Neutrophils Relative %: 76 %
Platelet Count: 439 10*3/uL — ABNORMAL HIGH (ref 150–400)
RBC: 4.03 MIL/uL — ABNORMAL LOW (ref 4.22–5.81)
RDW: 17.3 % — ABNORMAL HIGH (ref 11.5–15.5)
WBC Count: 11.8 10*3/uL — ABNORMAL HIGH (ref 4.0–10.5)
nRBC: 0 % (ref 0.0–0.2)

## 2019-09-05 LAB — LACTATE DEHYDROGENASE: LDH: 179 U/L (ref 98–192)

## 2019-09-05 MED ORDER — ACETAMINOPHEN 325 MG PO TABS
650.0000 mg | ORAL_TABLET | Freq: Once | ORAL | Status: AC
  Administered 2019-09-05: 650 mg via ORAL

## 2019-09-05 MED ORDER — HEPARIN SOD (PORK) LOCK FLUSH 100 UNIT/ML IV SOLN
500.0000 [IU] | Freq: Once | INTRAVENOUS | Status: AC | PRN
  Administered 2019-09-05: 500 [IU]
  Filled 2019-09-05: qty 5

## 2019-09-05 MED ORDER — SODIUM CHLORIDE 0.9 % IV SOLN
Freq: Once | INTRAVENOUS | Status: AC
  Filled 2019-09-05: qty 5

## 2019-09-05 MED ORDER — DIPHENHYDRAMINE HCL 50 MG/ML IJ SOLN
50.0000 mg | Freq: Once | INTRAMUSCULAR | Status: AC
  Administered 2019-09-05: 50 mg via INTRAVENOUS

## 2019-09-05 MED ORDER — DIPHENHYDRAMINE HCL 50 MG/ML IJ SOLN
INTRAMUSCULAR | Status: AC
  Filled 2019-09-05: qty 1

## 2019-09-05 MED ORDER — SODIUM CHLORIDE 0.9% FLUSH
10.0000 mL | INTRAVENOUS | Status: DC | PRN
  Administered 2019-09-05: 10 mL
  Filled 2019-09-05: qty 10

## 2019-09-05 MED ORDER — SODIUM CHLORIDE 0.9 % IV SOLN
375.0000 mg/m2 | Freq: Once | INTRAVENOUS | Status: AC
  Administered 2019-09-05: 13:00:00 650 mg via INTRAVENOUS
  Filled 2019-09-05: qty 65

## 2019-09-05 MED ORDER — SODIUM CHLORIDE 0.9 % IV SOLN
Freq: Once | INTRAVENOUS | Status: AC
  Filled 2019-09-05: qty 250

## 2019-09-05 MED ORDER — DOXORUBICIN HCL CHEMO IV INJECTION 2 MG/ML
25.0000 mg/m2 | Freq: Once | INTRAVENOUS | Status: AC
  Administered 2019-09-05: 44 mg via INTRAVENOUS
  Filled 2019-09-05: qty 22

## 2019-09-05 MED ORDER — SODIUM CHLORIDE 0.9 % IV SOLN
1.2000 mg/kg | Freq: Once | INTRAVENOUS | Status: AC
  Administered 2019-09-05: 75 mg via INTRAVENOUS
  Filled 2019-09-05: qty 15

## 2019-09-05 MED ORDER — VINBLASTINE SULFATE CHEMO INJECTION 1 MG/ML
5.8000 mg/m2 | Freq: Once | INTRAVENOUS | Status: AC
  Administered 2019-09-05: 12:00:00 10 mg via INTRAVENOUS
  Filled 2019-09-05: qty 10

## 2019-09-05 MED ORDER — ACETAMINOPHEN 325 MG PO TABS
ORAL_TABLET | ORAL | Status: AC
  Filled 2019-09-05: qty 2

## 2019-09-05 MED ORDER — PALONOSETRON HCL INJECTION 0.25 MG/5ML
INTRAVENOUS | Status: AC
  Filled 2019-09-05: qty 5

## 2019-09-05 MED ORDER — PALONOSETRON HCL INJECTION 0.25 MG/5ML
0.2500 mg | Freq: Once | INTRAVENOUS | Status: AC
  Administered 2019-09-05: 11:00:00 0.25 mg via INTRAVENOUS

## 2019-09-05 NOTE — Progress Notes (Signed)
[facility] Work  Clinical Social Work met with patient prior to infusion treatment today.  CSW provided [employer] gift card to assist with gas.  Mr. Stanke shared his frustrations being unable to work and unable to get disability benefits.  CSW and patient discussed how to manage circumstances out of his control.  Patient reported feeling well, but expressed difficulty with weight loss.  CSW notified dietitian to review patient's needs.    Patient plans to follow up with CSW to receive [place] program gas card at 09/18/19 appointment.  Gwinda Maine, LCSW  Clinical Social Worker [facility]

## 2019-09-05 NOTE — Patient Instructions (Signed)
[facility] Discharge Instructions for Patients Receiving Chemotherapy  Today you received the following chemotherapy agents Adriamycin, Velban, DTIC and Adcentris  To help prevent nausea and vomiting after your treatment, we encourage you to take your nausea medication as directed.    If you develop nausea and vomiting that is not controlled by your nausea medication, call the clinic.   BELOW ARE SYMPTOMS THAT SHOULD BE REPORTED IMMEDIATELY:  *FEVER GREATER THAN 100.5 F  *CHILLS WITH OR WITHOUT FEVER  NAUSEA AND VOMITING THAT IS NOT CONTROLLED WITH YOUR NAUSEA MEDICATION  *UNUSUAL SHORTNESS OF BREATH  *UNUSUAL BRUISING OR BLEEDING  TENDERNESS IN MOUTH AND THROAT WITH OR WITHOUT PRESENCE OF ULCERS  *URINARY PROBLEMS  *BOWEL PROBLEMS  UNUSUAL RASH Items with * indicate a potential emergency and should be followed up as soon as possible.  Feel free to call the clinic should you have any questions or concerns. The clinic phone number is [phone].  Please show the [facility] at check-in to the Emergency Department and triage nurse.

## 2019-09-05 NOTE — Progress Notes (Signed)
Patient was referred by Education officer, museum.  54 year old male diagnosed with Hodgkin's lymphoma.  He is a patient of Dr. Julien Nordmann.  He is receiving cycle 4 of his chemotherapy.  Past medical history includes marijuana and alcohol usage.  Medications include Compazine.  Labs include glucose 121 on January 20.  Height: 5 feet 6 inches. Weight: 133.1 pounds. Usual body weight.  135 pounds per patient.  Noted weight of 140 pounds in March 2020. BMI: 21.48.  Patient reports he has had some weight fluctuations and he does not like it. He denies nutrition impact symptoms. He enjoys drinking Ensure or boost but cannot afford it.  Nutrition diagnosis:  Unintended weight loss related to Hodgkin's lymphoma and associated treatments as evidenced by 5% weight loss over 10 months which is not significant.  Intervention: I educated patient to consume small frequent meals and snacks with high-calorie, high-protein foods. Recommended patient resume Ensure Enlive.  I provided 1 complementary case. Gave patient fact sheets.  Questions were answered.  Teach back method used.  Contact information given.  Monitoring, evaluation, goals: Patient will tolerate increased calories and protein for weight stabilization.  Next visit: Tuesday, February 2 during infusion.  **Disclaimer: This note was dictated with voice recognition software. Similar sounding words can inadvertently be transcribed and this note may contain transcription errors which may not have been corrected upon publication of note.**

## 2019-09-05 NOTE — Progress Notes (Signed)
[place] [phone]   [phone]  OFFICE PROGRESS NOTE  Patient, No Pcp Per No address on file  DIAGNOSIS: Stage IVB Hodgkin's Lymphoma, nodular sclerosis subtype. He presented with a bulky left cervical and supraclavicular lymphadenopathy as well as involvement of the spleen, nodal disease in the porta hepatis, and extensive disease involving the marrow of the pelvis, spine, and ribs. He was diagnosed in September 2020.   IPS score: 4   PRIOR THERAPY: None  CURRENT THERAPY: Systemic chemotherapy with brentuximab vedotin 1.2 mg/kg, doxorubicin 25 mg/m2, vinblastine 6 mg/m2, dacarbazine 375 mg/m2 on days 1 and 15 every 28 days. First dose expected on 05/30/2019.   Status post 3 cycles.  He is here today for day 15 of cycle #4.  INTERVAL HISTORY: Jesse [name] 54 y.o. [name] returns to the clinic today for follow-up visit.  The patient is feeling fine today with no concerning complaints.  He denied having any current chest pain, shortness of breath, cough or hemoptysis.  He denied having any fever or chills.  He has no nausea, vomiting, diarrhea or constipation.  He has no headache or visual changes.  He continues to tolerate his treatment with chemotherapy fairly well.  He is here today for evaluation before starting day 15 of cycle #4.  MEDICAL HISTORY: Past Medical History:  Diagnosis Date   ETOH abuse    Lymphadenopathy    left   Marijuana abuse     ALLERGIES:  has No Known Allergies.  MEDICATIONS:  Current Outpatient Medications  Medication Sig Dispense Refill   acetaminophen (TYLENOL) 500 MG tablet Take 1,000 mg by mouth every 6 (six) hours as needed.     allopurinol (ZYLOPRIM) 100 MG tablet Take 1 tablet (100 mg total) by mouth 2 (two) times daily. 60 tablet 2   ibuprofen (ADVIL) 800 MG tablet Take 800 mg by mouth every 8 (eight) hours as needed.     lidocaine-prilocaine (EMLA) cream Apply 1 application topically  as needed. Apply to port site  45 minutes prior to port being accessed 30 g 1   oxyCODONE-acetaminophen (PERCOCET/ROXICET) 5-325 MG tablet TAKE 1 TABLET BY MOUTH EVERY 8 HOURS AS NEEDED FOR SEVERE PAIN. 30 tablet 0   prochlorperazine (COMPAZINE) 10 MG tablet Take 1 tablet (10 mg total) by mouth every 8 (eight) hours as needed for nausea or vomiting. 30 tablet 2   No current facility-administered medications for this visit.    SURGICAL HISTORY:  Past Surgical History:  Procedure Laterality Date   HERNIA REPAIR     as child   IR IMAGING GUIDED PORT INSERTION  05/29/2019   LYMPH NODE BIOPSY Left 05/07/2019   Procedure: LYMPH NODE BIOPSY;  Surgeon: Melida Quitter, MD;  Location: [facility];  Service: ENT;  Laterality: Left;    REVIEW OF SYSTEMS:  A comprehensive review of systems was negative except for: Constitutional: positive for fatigue   PHYSICAL EXAMINATION: General appearance: alert, cooperative, fatigued and no distress Head: Normocephalic, without obvious abnormality, atraumatic Neck: no adenopathy, no JVD, supple, symmetrical, trachea midline and thyroid not enlarged, symmetric, no tenderness/mass/nodules Lymph nodes: Cervical, supraclavicular, and axillary nodes normal. Resp: clear to auscultation bilaterally Back: symmetric, no curvature. ROM normal. No CVA tenderness. Cardio: regular rate and rhythm, S1, S2 normal, no murmur, click, rub or gallop GI: soft, non-tender; bowel sounds normal; no masses,  no organomegaly Extremities: extremities normal, atraumatic, no cyanosis or edema  ECOG PERFORMANCE STATUS: 1 -  Symptomatic but completely ambulatory  Blood pressure 121/82, pulse 89, temperature 97.7 F (36.5 C), temperature source Oral, resp. rate 18, height 5\' 6"  (1.676 m), weight 133 lb 1.6 oz (60.4 kg), SpO2 100 %.  LABORATORY DATA: Lab Results  Component Value Date   WBC 11.8 (H) 09/05/2019   HGB 11.7 (L) 09/05/2019   HCT 36.6 (L) 09/05/2019   MCV  90.8 09/05/2019   PLT 439 (H) 09/05/2019      Chemistry      Component Value Date/Time   NA 140 08/21/2019 0957   K 4.0 08/21/2019 0957   CL 106 08/21/2019 0957   CO2 25 08/21/2019 0957   BUN 16 08/21/2019 0957   CREATININE 0.70 08/21/2019 0957      Component Value Date/Time   CALCIUM 8.8 (L) 08/21/2019 0957   ALKPHOS 160 (H) 08/21/2019 0957   AST 12 (L) 08/21/2019 0957   ALT 10 08/21/2019 0957   BILITOT 0.2 (L) 08/21/2019 0957       RADIOGRAPHIC STUDIES: NM PET Image Restag (PS) Skull Base To Thigh  Result Date: 08/13/2019 CLINICAL DATA:  Subsequent treatment strategy for nodular sclerosis Hodgkin's lymphoma. EXAM: NUCLEAR MEDICINE PET SKULL BASE TO THIGH TECHNIQUE: 6.7 mCi F-18 FDG was injected intravenously. Full-ring PET imaging was performed from the skull base to thigh after the radiotracer. CT data was obtained and used for attenuation correction and anatomic localization. Fasting blood glucose: 82 mg/dl COMPARISON:  PET-CT from 05/21/2019 FINDINGS: Mediastinal blood pool activity: SUV max 1.6 Liver activity: SUV max 2.5 NECK: The previous extensive hypermetabolic adenopathy in the left neck is prominently improved, prior conglomerate left level 2 and 3 adenopathy with a maximum SUV of 2.0 (formerly 19.1) and prior level IV and left supraclavicular adenopathy with a maximum SUV of 1.5 (formerly 23.7). The individual lymph nodes in the neck are difficult to measure due to reduced size and conspicuity, but probably the conglomerate level 2 adenopathy measures up to about 1.5 cm in diameter, previously 3.1 cm. Similar marked reduction in the left level IV/supraclavicular lymph nodes. Incidental CT findings: Mild bilateral common carotid atherosclerotic calcification. CHEST: No significant abnormal hypermetabolic activity in this region. Incidental CT findings: Right Port-A-Cath tip: SVC. Coronary, aortic arch, and branch vessel atherosclerotic vascular disease. ABDOMEN/PELVIS:  Previous multifocal hypermetabolic activity in the spleen has resolved. Previous hypermetabolic adenopathy in the upper abdomen has resolved. Incidental CT findings: Stable hypodense 1.1 cm right hepatic lobe lesion on image 102/4, not hypermetabolic. Aortoiliac atherosclerotic vascular disease. SKELETON: The previous lesions are not conspicuous on the CT data. The patient previously had extensive multifocal skeletal involvement, there is currently diffuse skeletal activity likely from granulocyte stimulation. It is difficult to discern previous focal hypermetabolic activity against this background. There is some mild heterogeneity of activity, for example in the L5 vertebra, which could indicate some residual osseous disease although this is uncertain, and given the extensive improvement in the soft tissue disease, my expectation is that the bony disease is likely following suit. Incidental CT findings: Mesoacromial os acromiale on the right incidentally noted. IMPRESSION: 1. Marked improvement in previous adenopathy in the neck and abdomen, with resolution of prior hypermetabolic adenopathy and prominent reduction in size of the previous lymph nodes. 2. Resolution of prior abnormal activity in the spleen. 3. Diffuse accentuated osseous activity likely from granulocyte stimulation. The previous osseous lesions are difficult to see against this background. My expectation is that these are probably markedly improved similar to the soft tissue disease, but the background  high activity throughout the skeleton diffusely on today's exam makes this less certain. 4.  Aortic Atherosclerosis (ICD10-I70.0).  Coronary atherosclerosis. Electronically Signed   By: Van Clines M.D.   On: 08/13/2019 14:40    ASSESSMENT AND PLAN: This is a very pleasant 54 years old African-American [name] recently diagnosed with a stage IV non-Hodgkin lymphoma, nodular sclerosing subtype.  The patient is currently undergoing systemic  chemotherapy with brentuximab Vedotin in addition to doxorubicin, vinblastine and dacarbazine.  Status post 3 cycles.  The patient is currently undergoing cycle #4.  He is tolerating the treatment well. I recommended for him to proceed with day 15 of cycle #4 today as planned. He will come back for follow-up visit in 2 weeks for evaluation before the next cycle of his treatment. He was advised to call immediately if he has any concerning symptoms in the interval. The patient voices understanding of current disease status and treatment options and is in agreement with the current care plan. All questions were answered. The patient knows to call the clinic with any problems, questions or concerns. We can certainly see the patient much sooner if necessary.  Disclaimer: This note was dictated with voice recognition software. Similar sounding words can inadvertently be transcribed and may not be corrected upon review.

## 2019-09-07 ENCOUNTER — Telehealth: Payer: Self-pay | Admitting: Internal Medicine

## 2019-09-07 ENCOUNTER — Inpatient Hospital Stay: Payer: Medicaid Other

## 2019-09-07 ENCOUNTER — Other Ambulatory Visit: Payer: Self-pay

## 2019-09-07 VITALS — BP 111/71 | HR 84 | Temp 98.5°F | Resp 18

## 2019-09-07 DIAGNOSIS — Z5112 Encounter for antineoplastic immunotherapy: Secondary | ICD-10-CM | POA: Diagnosis not present

## 2019-09-07 DIAGNOSIS — C8118 Nodular sclerosis classical Hodgkin lymphoma, lymph nodes of multiple sites: Secondary | ICD-10-CM

## 2019-09-07 MED ORDER — PEGFILGRASTIM-CBQV 6 MG/0.6ML ~~LOC~~ SOSY
PREFILLED_SYRINGE | SUBCUTANEOUS | Status: AC
  Filled 2019-09-07: qty 0.6

## 2019-09-07 MED ORDER — PEGFILGRASTIM-CBQV 6 MG/0.6ML ~~LOC~~ SOSY
6.0000 mg | PREFILLED_SYRINGE | Freq: Once | SUBCUTANEOUS | Status: AC
  Administered 2019-09-07: 6 mg via SUBCUTANEOUS

## 2019-09-07 NOTE — Telephone Encounter (Signed)
Scheduled per los. Called and left msg. Mailed printout  °

## 2019-09-07 NOTE — Patient Instructions (Addendum)

## 2019-09-18 ENCOUNTER — Inpatient Hospital Stay (HOSPITAL_BASED_OUTPATIENT_CLINIC_OR_DEPARTMENT_OTHER): Payer: Medicaid Other | Admitting: Internal Medicine

## 2019-09-18 ENCOUNTER — Inpatient Hospital Stay: Payer: Medicaid Other | Admitting: Nutrition

## 2019-09-18 ENCOUNTER — Inpatient Hospital Stay: Payer: Medicaid Other

## 2019-09-18 ENCOUNTER — Inpatient Hospital Stay: Payer: Medicaid Other | Attending: Internal Medicine

## 2019-09-18 ENCOUNTER — Encounter: Payer: Self-pay | Admitting: Internal Medicine

## 2019-09-18 ENCOUNTER — Other Ambulatory Visit: Payer: Self-pay

## 2019-09-18 VITALS — BP 128/80 | HR 66 | Temp 99.1°F | Resp 17 | Ht 66.0 in | Wt 137.8 lb

## 2019-09-18 DIAGNOSIS — M791 Myalgia, unspecified site: Secondary | ICD-10-CM | POA: Diagnosis not present

## 2019-09-18 DIAGNOSIS — C8118 Nodular sclerosis classical Hodgkin lymphoma, lymph nodes of multiple sites: Secondary | ICD-10-CM

## 2019-09-18 DIAGNOSIS — Z5111 Encounter for antineoplastic chemotherapy: Secondary | ICD-10-CM | POA: Insufficient documentation

## 2019-09-18 DIAGNOSIS — Z95828 Presence of other vascular implants and grafts: Secondary | ICD-10-CM

## 2019-09-18 DIAGNOSIS — M25559 Pain in unspecified hip: Secondary | ICD-10-CM | POA: Insufficient documentation

## 2019-09-18 DIAGNOSIS — Z5189 Encounter for other specified aftercare: Secondary | ICD-10-CM | POA: Diagnosis not present

## 2019-09-18 DIAGNOSIS — R591 Generalized enlarged lymph nodes: Secondary | ICD-10-CM | POA: Insufficient documentation

## 2019-09-18 DIAGNOSIS — M2550 Pain in unspecified joint: Secondary | ICD-10-CM | POA: Insufficient documentation

## 2019-09-18 DIAGNOSIS — Z79899 Other long term (current) drug therapy: Secondary | ICD-10-CM | POA: Insufficient documentation

## 2019-09-18 DIAGNOSIS — R5383 Other fatigue: Secondary | ICD-10-CM | POA: Insufficient documentation

## 2019-09-18 DIAGNOSIS — G893 Neoplasm related pain (acute) (chronic): Secondary | ICD-10-CM

## 2019-09-18 DIAGNOSIS — Z5112 Encounter for antineoplastic immunotherapy: Secondary | ICD-10-CM | POA: Diagnosis not present

## 2019-09-18 LAB — CMP (CANCER CENTER ONLY)
ALT: 19 U/L (ref 0–44)
AST: 16 U/L (ref 15–41)
Albumin: 3.9 g/dL (ref 3.5–5.0)
Alkaline Phosphatase: 165 U/L — ABNORMAL HIGH (ref 38–126)
Anion gap: 7 (ref 5–15)
BUN: 13 mg/dL (ref 6–20)
CO2: 27 mmol/L (ref 22–32)
Calcium: 8.9 mg/dL (ref 8.9–10.3)
Chloride: 105 mmol/L (ref 98–111)
Creatinine: 0.74 mg/dL (ref 0.61–1.24)
GFR, Est AFR Am: >60 mL/min
GFR, Estimated: >60 mL/min
Glucose, Bld: 92 mg/dL (ref 70–99)
Potassium: 4 mmol/L (ref 3.5–5.1)
Sodium: 139 mmol/L (ref 135–145)
Total Bilirubin: 0.3 mg/dL (ref 0.3–1.2)
Total Protein: 7.1 g/dL (ref 6.5–8.1)

## 2019-09-18 LAB — CBC WITH DIFFERENTIAL (CANCER CENTER ONLY)
Abs Immature Granulocytes: 1.12 10*3/uL — ABNORMAL HIGH (ref 0.00–0.07)
Basophils Absolute: 0.1 10*3/uL (ref 0.0–0.1)
Basophils Relative: 1 %
Eosinophils Absolute: 0.1 10*3/uL (ref 0.0–0.5)
Eosinophils Relative: 0 %
HCT: 35.7 % — ABNORMAL LOW (ref 39.0–52.0)
Hemoglobin: 11.3 g/dL — ABNORMAL LOW (ref 13.0–17.0)
Immature Granulocytes: 6 %
Lymphocytes Relative: 8 %
Lymphs Abs: 1.5 10*3/uL (ref 0.7–4.0)
MCH: 29.4 pg (ref 26.0–34.0)
MCHC: 31.7 g/dL (ref 30.0–36.0)
MCV: 92.7 fL (ref 80.0–100.0)
Monocytes Absolute: 1.5 10*3/uL — ABNORMAL HIGH (ref 0.1–1.0)
Monocytes Relative: 8 %
Neutro Abs: 14.2 10*3/uL — ABNORMAL HIGH (ref 1.7–7.7)
Neutrophils Relative %: 77 %
Platelet Count: 358 10*3/uL (ref 150–400)
RBC: 3.85 MIL/uL — ABNORMAL LOW (ref 4.22–5.81)
RDW: 17.7 % — ABNORMAL HIGH (ref 11.5–15.5)
WBC Count: 18.5 10*3/uL — ABNORMAL HIGH (ref 4.0–10.5)
nRBC: 0.2 % (ref 0.0–0.2)

## 2019-09-18 LAB — LACTATE DEHYDROGENASE: LDH: 209 U/L — ABNORMAL HIGH (ref 98–192)

## 2019-09-18 MED ORDER — SODIUM CHLORIDE 0.9 % IV SOLN
Freq: Once | INTRAVENOUS | Status: AC
  Filled 2019-09-18: qty 5

## 2019-09-18 MED ORDER — SODIUM CHLORIDE 0.9% FLUSH
10.0000 mL | INTRAVENOUS | Status: DC | PRN
  Administered 2019-09-18: 16:00:00 10 mL
  Filled 2019-09-18: qty 10

## 2019-09-18 MED ORDER — HEPARIN SOD (PORK) LOCK FLUSH 100 UNIT/ML IV SOLN
500.0000 [IU] | Freq: Once | INTRAVENOUS | Status: AC | PRN
  Administered 2019-09-18: 16:00:00 500 [IU]
  Filled 2019-09-18: qty 5

## 2019-09-18 MED ORDER — SODIUM CHLORIDE 0.9% FLUSH
10.0000 mL | INTRAVENOUS | Status: DC | PRN
  Filled 2019-09-18: qty 10

## 2019-09-18 MED ORDER — SODIUM CHLORIDE 0.9 % IV SOLN
1.2000 mg/kg | Freq: Once | INTRAVENOUS | Status: AC
  Administered 2019-09-18: 15:00:00 75 mg via INTRAVENOUS
  Filled 2019-09-18: qty 15

## 2019-09-18 MED ORDER — VINBLASTINE SULFATE CHEMO INJECTION 1 MG/ML
5.8000 mg/m2 | Freq: Once | INTRAVENOUS | Status: AC
  Administered 2019-09-18: 14:00:00 10 mg via INTRAVENOUS
  Filled 2019-09-18: qty 10

## 2019-09-18 MED ORDER — SODIUM CHLORIDE 0.9 % IV SOLN
Freq: Once | INTRAVENOUS | Status: AC
  Filled 2019-09-18: qty 250

## 2019-09-18 MED ORDER — ACETAMINOPHEN 325 MG PO TABS
650.0000 mg | ORAL_TABLET | Freq: Once | ORAL | Status: AC
  Administered 2019-09-18: 12:00:00 650 mg via ORAL

## 2019-09-18 MED ORDER — DIPHENHYDRAMINE HCL 50 MG/ML IJ SOLN
INTRAMUSCULAR | Status: AC
  Filled 2019-09-18: qty 1

## 2019-09-18 MED ORDER — DOXORUBICIN HCL CHEMO IV INJECTION 2 MG/ML
25.0000 mg/m2 | Freq: Once | INTRAVENOUS | Status: AC
  Administered 2019-09-18: 14:00:00 44 mg via INTRAVENOUS
  Filled 2019-09-18: qty 22

## 2019-09-18 MED ORDER — SODIUM CHLORIDE 0.9 % IV SOLN
375.0000 mg/m2 | Freq: Once | INTRAVENOUS | Status: AC
  Administered 2019-09-18: 650 mg via INTRAVENOUS
  Filled 2019-09-18: qty 65

## 2019-09-18 MED ORDER — ACETAMINOPHEN 325 MG PO TABS
ORAL_TABLET | ORAL | Status: AC
  Filled 2019-09-18: qty 2

## 2019-09-18 MED ORDER — DIPHENHYDRAMINE HCL 50 MG/ML IJ SOLN
50.0000 mg | Freq: Once | INTRAMUSCULAR | Status: AC
  Administered 2019-09-18: 50 mg via INTRAVENOUS

## 2019-09-18 MED ORDER — PALONOSETRON HCL INJECTION 0.25 MG/5ML
0.2500 mg | Freq: Once | INTRAVENOUS | Status: AC
  Administered 2019-09-18: 12:00:00 0.25 mg via INTRAVENOUS

## 2019-09-18 MED ORDER — PALONOSETRON HCL INJECTION 0.25 MG/5ML
INTRAVENOUS | Status: AC
  Filled 2019-09-18: qty 5

## 2019-09-18 NOTE — Patient Instructions (Signed)
[facility] Discharge Instructions for Patients Receiving Chemotherapy  Today you received the following chemotherapy agents: Doxorubicin (Adriamycin), Vinblastine (Velban), Dacarbazine (DTIC), and Brentuximab vedotin (Adcetris)  To help prevent nausea and vomiting after your treatment, we encourage you to take your nausea medication as directed.   If you develop nausea and vomiting that is not controlled by your nausea medication, call the clinic.   BELOW ARE SYMPTOMS THAT SHOULD BE REPORTED IMMEDIATELY:  *FEVER GREATER THAN 100.5 F  *CHILLS WITH OR WITHOUT FEVER  NAUSEA AND VOMITING THAT IS NOT CONTROLLED WITH YOUR NAUSEA MEDICATION  *UNUSUAL SHORTNESS OF BREATH  *UNUSUAL BRUISING OR BLEEDING  TENDERNESS IN MOUTH AND THROAT WITH OR WITHOUT PRESENCE OF ULCERS  *URINARY PROBLEMS  *BOWEL PROBLEMS  UNUSUAL RASH Items with * indicate a potential emergency and should be followed up as soon as possible.  Feel free to call the clinic should you have any questions or concerns. The clinic phone number is [phone].  Please show the [facility] at check-in to the Emergency Department and triage nurse.  Coronavirus (COVID-19) Are you at risk?  Are you at risk for the Coronavirus (COVID-19)?  To be considered HIGH RISK for Coronavirus (COVID-19), you have to meet the following criteria:  . Traveled to [place], [place], [place], [place] or [place]; or in the [place] to [place], [place], [place], or [place]; and have fever, cough, and shortness of breath within the last 2 weeks of travel OR . Been in close contact with a person diagnosed with COVID-19 within the last 2 weeks and have fever, cough, and shortness of breath . IF YOU DO NOT MEET THESE CRITERIA, YOU ARE CONSIDERED LOW RISK FOR COVID-19.  What to do if you are HIGH RISK for COVID-19?  Marland Kitchen If you are having a medical emergency, call 911. . Seek medical care right away. Before you go to a  doctor's office, urgent care or emergency department, call ahead and tell them about your recent travel, contact with someone diagnosed with COVID-19, and your symptoms. You should receive instructions from your physician's office regarding next steps of care.  . When you arrive at healthcare provider, tell the healthcare staff immediately you have returned from visiting [place], [place], [place], [place] or [place]; or traveled in the [place] to [place], [place], [place], or [place]; in the last two weeks or you have been in close contact with a person diagnosed with COVID-19 in the last 2 weeks.   . Tell the health care staff about your symptoms: fever, cough and shortness of breath. . After you have been seen by a medical provider, you will be either: o Tested for (COVID-19) and discharged home on quarantine except to seek medical care if symptoms worsen, and asked to  - Stay home and avoid contact with others until you get your results (4-5 days)  - Avoid travel on public transportation if possible (such as bus, train, or airplane) or o Sent to the Emergency Department by EMS for evaluation, COVID-19 testing, and possible admission depending on your condition and test results.  What to do if you are LOW RISK for COVID-19?  Reduce your risk of any infection by using the same precautions used for avoiding the common cold or flu:  Marland Kitchen Wash your hands often with soap and warm water for at least 20 seconds.  If soap and water are not readily available, use an alcohol-based hand sanitizer with at  least 60% alcohol.  . If coughing or sneezing, cover your mouth and nose by coughing or sneezing into the elbow areas of your shirt or coat, into a tissue or into your sleeve (not your hands). . Avoid shaking hands with others and consider head nods or verbal greetings only. . Avoid touching your eyes, nose, or mouth with unwashed hands.  . Avoid close contact with people who are sick. . Avoid  places or events with large numbers of people in one location, like concerts or sporting events. . Carefully consider travel plans you have or are making. . If you are planning any travel outside or inside the [place], visit the CDC's Travelers' Health webpage for the latest health notices. . If you have some symptoms but not all symptoms, continue to monitor at home and seek medical attention if your symptoms worsen. . If you are having a medical emergency, call 911.   [facility] / e-Visit: [email or web address]         [facility]: [facility]: [id number]                   [facility]: [phone]

## 2019-09-18 NOTE — Progress Notes (Signed)
[facility] [phone]   Fax:([phone]  OFFICE PROGRESS NOTE  Patient, No Pcp Per No address on file  DIAGNOSIS: Stage IVB Hodgkin's Lymphoma, nodular sclerosis subtype. He presented with a bulky left cervical and supraclavicular lymphadenopathy as well as involvement of the spleen, nodal disease in the porta hepatis, and extensive disease involving the marrow of the pelvis, spine, and ribs. He was diagnosed in September 2020.   IPS score: 4   PRIOR THERAPY: None  CURRENT THERAPY: Systemic chemotherapy with brentuximab vedotin 1.2 mg/kg, doxorubicin 25 mg/m2, vinblastine 6 mg/m2, dacarbazine 375 mg/m2 on days 1 and 15 every 28 days. First dose expected on 05/30/2019.   Status post 4 cycles.  He is here today for day 15 of cycle #4.  INTERVAL HISTORY: Jesse [name] 54 y.o. male returns to the clinic today for follow-up visit.  The patient is feeling fine today with no concerning complaints except for hip pain after the Neulasta injection.  He denied having any current chest pain, shortness of breath, cough or hemoptysis.  He denied having any fever or chills.  He has no nausea, vomiting, diarrhea or constipation.  He has no headache or visual changes.  Is here today for evaluation before starting cycle #5 of his treatment.  MEDICAL HISTORY: Past Medical History:  Diagnosis Date  . ETOH abuse   . Lymphadenopathy    left  . Marijuana abuse     ALLERGIES:  has No Known Allergies.  MEDICATIONS:  Current Outpatient Medications  Medication Sig Dispense Refill  . allopurinol (ZYLOPRIM) 100 MG tablet Take 1 tablet (100 mg total) by mouth 2 (two) times daily. 60 tablet 2  . lidocaine-prilocaine (EMLA) cream Apply 1 application topically as needed. Apply to port site  45 minutes prior to port being accessed 30 g 1  . acetaminophen (TYLENOL) 500 MG tablet Take 1,000 mg by mouth every 6 (six) hours as needed.    Marland Kitchen ibuprofen (ADVIL) 800 MG  tablet Take 800 mg by mouth every 8 (eight) hours as needed.    . prochlorperazine (COMPAZINE) 10 MG tablet Take 1 tablet (10 mg total) by mouth every 8 (eight) hours as needed for nausea or vomiting. (Patient not taking: Reported on 09/18/2019) 30 tablet 2   No current facility-administered medications for this visit.    SURGICAL HISTORY:  Past Surgical History:  Procedure Laterality Date  . HERNIA REPAIR     as child  . IR IMAGING GUIDED PORT INSERTION  05/29/2019  . LYMPH NODE BIOPSY Left 05/07/2019   Procedure: LYMPH NODE BIOPSY;  Surgeon: Melida Quitter, MD;  Location: [facility];  Service: ENT;  Laterality: Left;    REVIEW OF SYSTEMS:  A comprehensive review of systems was negative except for: Constitutional: positive for fatigue Musculoskeletal: positive for arthralgias   PHYSICAL EXAMINATION: General appearance: alert, cooperative, fatigued and no distress Head: Normocephalic, without obvious abnormality, atraumatic Neck: no adenopathy, no JVD, supple, symmetrical, trachea midline and thyroid not enlarged, symmetric, no tenderness/mass/nodules Lymph nodes: Cervical, supraclavicular, and axillary nodes normal. Resp: clear to auscultation bilaterally Back: symmetric, no curvature. ROM normal. No CVA tenderness. Cardio: regular rate and rhythm, S1, S2 normal, no murmur, click, rub or gallop GI: soft, non-tender; bowel sounds normal; no masses,  no organomegaly Extremities: extremities normal, atraumatic, no cyanosis or edema  ECOG PERFORMANCE STATUS: 1 - Symptomatic but completely ambulatory  Blood pressure 128/80, pulse 66, temperature 99.1 F (37.3 C), temperature source  Temporal, resp. rate 17, height 5\' 6"  (1.676 m), weight 137 lb 12.8 oz (62.5 kg), SpO2 100 %.  LABORATORY DATA: Lab Results  Component Value Date   WBC 18.5 (H) 09/18/2019   HGB 11.3 (L) 09/18/2019   HCT 35.7 (L) 09/18/2019   MCV 92.7 09/18/2019   PLT 358 09/18/2019      Chemistry        Component Value Date/Time   NA 139 09/18/2019 1055   K 4.0 09/18/2019 1055   CL 105 09/18/2019 1055   CO2 27 09/18/2019 1055   BUN 13 09/18/2019 1055   CREATININE 0.74 09/18/2019 1055      Component Value Date/Time   CALCIUM 8.9 09/18/2019 1055   ALKPHOS 165 (H) 09/18/2019 1055   AST 16 09/18/2019 1055   ALT 19 09/18/2019 1055   BILITOT 0.3 09/18/2019 1055       RADIOGRAPHIC STUDIES: No results found.  ASSESSMENT AND PLAN: This is a very pleasant 54 years old African-American male recently diagnosed with a stage IV non-Hodgkin lymphoma, nodular sclerosing subtype.  The patient is currently undergoing systemic chemotherapy with brentuximab Vedotin in addition to doxorubicin, vinblastine and dacarbazine.  Status post 4 cycles.  The patient continues to tolerate this treatment well with no concerning adverse effects. I recommended for him to proceed with day 1 of cycle #5 today as planned. The patient will come back for follow-up visit in 2 weeks for evaluation before starting day 15 of cycle #5. For the Neulasta pain he will take ibuprofen as well as Claritin after the injection. He was advised to call immediately if he has any other concerning symptoms in the interval. The patient voices understanding of current disease status and treatment options and is in agreement with the current care plan. All questions were answered. The patient knows to call the clinic with any problems, questions or concerns. We can certainly see the patient much sooner if necessary.  Disclaimer: This note was dictated with voice recognition software. Similar sounding words can inadvertently be transcribed and may not be corrected upon review.

## 2019-09-18 NOTE — Progress Notes (Signed)
Nutrition follow-up completed with patient during infusion for Hodgkin's lymphoma. Patient denies nausea, vomiting, diarrhea, and constipation. Patient's weight has improved and was documented as 137.8 pounds on February 2 improved from 133.1 pounds January 20. Patient reports he is trying to eat more and has been drinking Ensure Enlive. Patient declines a need for additional samples today.  Nutrition diagnosis: Unintended weight loss improved.  Intervention: Patient was educated to continue strategies for increased calories and protein in foods and oral nutrition supplements. Educated to continue [employer]. Encourage patient to contact me for need of additional oral nutrition supplements samples.  Monitoring, evaluation, goals: Patient will continue to work to increase calories and protein to minimize weight loss.  Next visit: Nutrition follow-up to be scheduled as needed.  Patient has my contact information.

## 2019-09-20 ENCOUNTER — Inpatient Hospital Stay: Payer: Medicaid Other

## 2019-09-20 ENCOUNTER — Other Ambulatory Visit: Payer: Self-pay

## 2019-09-20 VITALS — BP 131/77 | HR 72 | Temp 98.3°F | Resp 18

## 2019-09-20 DIAGNOSIS — Z5112 Encounter for antineoplastic immunotherapy: Secondary | ICD-10-CM | POA: Diagnosis not present

## 2019-09-20 DIAGNOSIS — C8118 Nodular sclerosis classical Hodgkin lymphoma, lymph nodes of multiple sites: Secondary | ICD-10-CM

## 2019-09-20 MED ORDER — PEGFILGRASTIM-CBQV 6 MG/0.6ML ~~LOC~~ SOSY
PREFILLED_SYRINGE | SUBCUTANEOUS | Status: AC
  Filled 2019-09-20: qty 0.6

## 2019-09-20 MED ORDER — PEGFILGRASTIM-CBQV 6 MG/0.6ML ~~LOC~~ SOSY
6.0000 mg | PREFILLED_SYRINGE | Freq: Once | SUBCUTANEOUS | Status: AC
  Administered 2019-09-20: 6 mg via SUBCUTANEOUS

## 2019-09-20 NOTE — Patient Instructions (Signed)

## 2019-10-01 NOTE — Progress Notes (Signed)
[facility] OFFICE PROGRESS NOTE  Patient, No Pcp Per No address on file  DIAGNOSIS: Stage IVB Hodgkin's Lymphoma, nodular sclerosis subtype. He presented with a bulky left cervical and supraclavicular lymphadenopathy as well as involvement of the spleen, nodal disease in the porta hepatis, and extensive disease involving the marrow of the pelvis, spine, and ribs. He was diagnosed in September 2020.   IPS score: 4  PRIOR THERAPY: None  CURRENT THERAPY: Systemic chemotherapy with brentuximab vedotin 1.2 mg/kg, doxorubicin 25 mg/m2, vinblastine 6 mg/m2, dacarbazine 375 mg/m2 on days 1 and 15 every 28 days. First dose expected on 05/30/2019.Status postday 1 cycle 5.   INTERVAL HISTORY: Jesse [name] 54 y.o. male returns to the clinic for a follow up visit. 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 He tolerated his last treatment well without any adverse side effects.  Denies any fever, chills, night sweats, or weight loss. His appetite has been good recently. He gained a few pounds since his last appointment. He states he has been drinking boost/ensure. Denies any shortness of breath, cough, or hemoptysis. Denies any nausea, vomiting, diarrhea, or constipation. Denies any headache or visual changes. Denies any numbness or tingling in his hands or feet. The patient is here today for evaluation prior to starting day 15 cycle 5.    MEDICAL HISTORY: Past Medical History:  Diagnosis Date  . ETOH abuse   . Lymphadenopathy    left  . Marijuana abuse     ALLERGIES:  has No Known Allergies.  MEDICATIONS:  Current Outpatient Medications  Medication Sig Dispense Refill  . acetaminophen (TYLENOL) 500 MG tablet Take 1,000 mg by mouth every 6 (six) hours as needed.    Marland Kitchen allopurinol (ZYLOPRIM) 100 MG tablet Take 1 tablet (100 mg total) by mouth 2 (two)  times daily. 60 tablet 2  . ibuprofen (ADVIL) 800 MG tablet Take 800 mg by mouth every 8 (eight) hours as needed.    . lidocaine-prilocaine (EMLA) cream Apply 1 application topically as needed. Apply to port site  45 minutes prior to port being accessed 30 g 1  . prochlorperazine (COMPAZINE) 10 MG tablet Take 1 tablet (10 mg total) by mouth every 8 (eight) hours as needed for nausea or vomiting. 30 tablet 2   No current facility-administered medications for this visit.   Facility-Administered Medications Ordered in Other Visits  Medication Dose Route Frequency Provider Last Rate Last Admin  . brentuximab vedotin (ADCETRIS) 75 mg in sodium chloride 0.9 % 100 mL chemo infusion  1.2 mg/kg (Treatment Plan Recorded) Intravenous Once Curt Bears, MD      . dacarbazine (DTIC) 650 mg in sodium chloride 0.9 % 250 mL chemo infusion  375 mg/m2 (Treatment Plan Recorded) Intravenous Once Curt Bears, MD      . diphenhydrAMINE (BENADRYL) injection 50 mg  50 mg Intravenous Once Curt Bears, MD      . DOXOrubicin (ADRIAMYCIN) chemo injection 44 mg  25 mg/m2 (Treatment Plan Recorded) Intravenous Once Curt Bears, MD      . fosaprepitant (EMEND) 150 mg, dexamethasone (DECADRON) 12 mg in sodium chloride 0.9 % 145 mL IVPB   Intravenous Once Curt Bears, MD      . heparin lock flush 100 unit/mL  500 Units Intracatheter Once PRN Curt Bears, MD      . sodium chloride flush (NS) 0.9 % injection 10 mL  10 mL Intracatheter PRN  Curt Bears, MD      . vinBLAStine (VELBAN) 10 mg in sodium chloride 0.9 % 50 mL chemo infusion  5.8 mg/m2 (Treatment Plan Recorded) Intravenous Once Curt Bears, MD        SURGICAL HISTORY:  Past Surgical History:  Procedure Laterality Date  . HERNIA REPAIR     as child  . IR IMAGING GUIDED PORT INSERTION  05/29/2019  . LYMPH NODE BIOPSY Left 05/07/2019   Procedure: LYMPH NODE BIOPSY;  Surgeon: Melida Quitter, MD;  Location: [facility];   Service: ENT;  Laterality: Left;    REVIEW OF SYSTEMS:   Review of Systems  Constitutional: Negative for appetite change, chills, fatigue, fever and unexpected weight change.  HENT:  Negative for mouth sores, nosebleeds, sore throat and trouble swallowing.  Eyes: Negative for eye problems and icterus.  Respiratory: Negative for cough, hemoptysis, shortness of breath and wheezing.   Cardiovascular: Negative for chest pain and leg swelling.  Gastrointestinal: Negative for abdominal pain, constipation, diarrhea, nausea and vomiting.  Genitourinary: Negative for bladder incontinence, difficulty urinating, dysuria, frequency and hematuria.   Musculoskeletal: Positive for myalgias/arthralgias secondary to neulasta. Negative for back pain, gait problem, neck pain and neck stiffness.  Skin: Negative for itching and rash.  Neurological: Negative for dizziness, extremity weakness, gait problem, headaches, light-headedness and seizures.  Hematological: Negative for adenopathy. Does not bruise/bleed easily.  Psychiatric/Behavioral: Negative for confusion, depression and sleep disturbance. The patient is not nervous/anxious.     PHYSICAL EXAMINATION:  Blood pressure 118/82, pulse 97, temperature 97.8 F (36.6 C), temperature source Temporal, resp. rate 20, height 5\' 6"  (1.676 m), weight 140 lb 11.2 oz (63.8 kg), SpO2 100 %.  ECOG PERFORMANCE STATUS: 1 - Symptomatic but completely ambulatory  Physical Exam  Constitutional: Oriented to person, place, and time and well-developed, well-nourished, and in no distress.  HENT:  Head: Normocephalic and atraumatic.  Mouth/Throat: Oropharynx is clear and moist. No oropharyngeal exudate.  Eyes: Conjunctivae are normal. Right eye exhibits no discharge. Left eye exhibits no discharge. No scleral icterus.  Neck: Normal range of motion. Neck supple.  Cardiovascular: Normal rate, regular rhythm, normal heart sounds and intact distal pulses.   Pulmonary/Chest:  Effort normal. Quiet breath sounds in all lung fields. No respiratory distress. No wheezes. No rales.  Abdominal: Soft. Bowel sounds are normal. Exhibits no distension and no mass. There is no tenderness.  Musculoskeletal: Normal range of motion. Exhibits no edema.  Lymphadenopathy:    No cervical adenopathy.  Neurological: Alert and oriented to person, place, and time. Exhibits normal muscle tone. Gait normal. Coordination normal.  Skin: Skin is warm and dry. No rash noted. Not diaphoretic. No erythema. No pallor.  Psychiatric: Mood, memory and judgment normal.  Vitals reviewed.  LABORATORY DATA: Lab Results  Component Value Date   WBC 27.9 (H) 10/02/2019   HGB 11.6 (L) 10/02/2019   HCT 35.5 (L) 10/02/2019   MCV 92.4 10/02/2019   PLT 346 10/02/2019      Chemistry      Component Value Date/Time   NA 140 10/02/2019 1014   K 3.9 10/02/2019 1014   CL 107 10/02/2019 1014   CO2 24 10/02/2019 1014   BUN 19 10/02/2019 1014   CREATININE 0.76 10/02/2019 1014      Component Value Date/Time   CALCIUM 8.6 (L) 10/02/2019 1014   ALKPHOS 157 (H) 10/02/2019 1014   AST 17 10/02/2019 1014   ALT 22 10/02/2019 1014   BILITOT 0.2 (L) 10/02/2019 1014  RADIOGRAPHIC STUDIES:  No results found.   ASSESSMENT/PLAN:  This is a very pleasant 54 years old African-American male recently diagnosed with a stage IV non-Hodgkin lymphoma, nodular sclerosing subtype. He presented with a bulky left cervical and supraclavicular lymphadenopathy as well as involvement of the spleen, nodal disease in the porta hepatis, and extensive disease involving the marrow of the pelvis, spine, and ribs. He was diagnosed in September 2020.  The patient is currently undergoing systemic chemotherapy with brentuximab Vedotin in addition to doxorubicin, vinblastine and dacarbazine. Status postday 1 cycle 5. He has been tolerating his treatment fairly well.  Labs were reviewed. He will receive day 15 cycle 5 today as  planned.   We will see him back for a follow up visit in 2 weeks for evaluation before starting day 1 cycle 6.   He will continue to use ibuprofen and Claritin for his pain secondary to neulasta.   The patient was advised to call immediately if he has any concerning symptoms in the interval. The patient voices understanding of current disease status and treatment options and is in agreement with the current care plan. All questions were answered. The patient knows to call the clinic with any problems, questions or concerns. We can certainly see the patient much sooner if necessary  No orders of the defined types were placed in this encounter.    Urijah Raynor L Herb Beltre, PA-C 10/02/19

## 2019-10-02 ENCOUNTER — Inpatient Hospital Stay: Payer: Medicaid Other

## 2019-10-02 ENCOUNTER — Other Ambulatory Visit: Payer: Self-pay

## 2019-10-02 ENCOUNTER — Inpatient Hospital Stay (HOSPITAL_BASED_OUTPATIENT_CLINIC_OR_DEPARTMENT_OTHER): Payer: Medicaid Other | Admitting: Physician Assistant

## 2019-10-02 ENCOUNTER — Encounter: Payer: Self-pay | Admitting: Physician Assistant

## 2019-10-02 VITALS — BP 118/82 | HR 97 | Temp 97.8°F | Resp 20 | Ht 66.0 in | Wt 140.7 lb

## 2019-10-02 DIAGNOSIS — Z5111 Encounter for antineoplastic chemotherapy: Secondary | ICD-10-CM

## 2019-10-02 DIAGNOSIS — Z5112 Encounter for antineoplastic immunotherapy: Secondary | ICD-10-CM | POA: Diagnosis not present

## 2019-10-02 DIAGNOSIS — C8118 Nodular sclerosis classical Hodgkin lymphoma, lymph nodes of multiple sites: Secondary | ICD-10-CM

## 2019-10-02 DIAGNOSIS — Z95828 Presence of other vascular implants and grafts: Secondary | ICD-10-CM

## 2019-10-02 LAB — CBC WITH DIFFERENTIAL (CANCER CENTER ONLY)
Abs Immature Granulocytes: 5.93 10*3/uL — ABNORMAL HIGH (ref 0.00–0.07)
Basophils Absolute: 0 10*3/uL (ref 0.0–0.1)
Basophils Relative: 0 %
Eosinophils Absolute: 0.1 10*3/uL (ref 0.0–0.5)
Eosinophils Relative: 0 %
HCT: 35.5 % — ABNORMAL LOW (ref 39.0–52.0)
Hemoglobin: 11.6 g/dL — ABNORMAL LOW (ref 13.0–17.0)
Immature Granulocytes: 21 %
Lymphocytes Relative: 6 %
Lymphs Abs: 1.7 10*3/uL (ref 0.7–4.0)
MCH: 30.2 pg (ref 26.0–34.0)
MCHC: 32.7 g/dL (ref 30.0–36.0)
MCV: 92.4 fL (ref 80.0–100.0)
Monocytes Absolute: 1.2 10*3/uL — ABNORMAL HIGH (ref 0.1–1.0)
Monocytes Relative: 4 %
Neutro Abs: 19 10*3/uL — ABNORMAL HIGH (ref 1.7–7.7)
Neutrophils Relative %: 69 %
Platelet Count: 346 10*3/uL (ref 150–400)
RBC: 3.84 MIL/uL — ABNORMAL LOW (ref 4.22–5.81)
RDW: 17.7 % — ABNORMAL HIGH (ref 11.5–15.5)
WBC Count: 27.9 10*3/uL — ABNORMAL HIGH (ref 4.0–10.5)
nRBC: 0.3 % — ABNORMAL HIGH (ref 0.0–0.2)

## 2019-10-02 LAB — CMP (CANCER CENTER ONLY)
ALT: 22 U/L (ref 0–44)
AST: 17 U/L (ref 15–41)
Albumin: 3.8 g/dL (ref 3.5–5.0)
Alkaline Phosphatase: 157 U/L — ABNORMAL HIGH (ref 38–126)
Anion gap: 9 (ref 5–15)
BUN: 19 mg/dL (ref 6–20)
CO2: 24 mmol/L (ref 22–32)
Calcium: 8.6 mg/dL — ABNORMAL LOW (ref 8.9–10.3)
Chloride: 107 mmol/L (ref 98–111)
Creatinine: 0.76 mg/dL (ref 0.61–1.24)
GFR, Est AFR Am: >60 mL/min (ref >60)
GFR, Estimated: >60 mL/min (ref >60)
Glucose, Bld: 108 mg/dL — ABNORMAL HIGH (ref 70–99)
Potassium: 3.9 mmol/L (ref 3.5–5.1)
Sodium: 140 mmol/L (ref 135–145)
Total Bilirubin: 0.2 mg/dL — ABNORMAL LOW (ref 0.3–1.2)
Total Protein: 7 g/dL (ref 6.5–8.1)

## 2019-10-02 LAB — LACTATE DEHYDROGENASE: LDH: 266 U/L — ABNORMAL HIGH (ref 98–192)

## 2019-10-02 MED ORDER — ACETAMINOPHEN 325 MG PO TABS
ORAL_TABLET | ORAL | Status: AC
  Filled 2019-10-02: qty 2

## 2019-10-02 MED ORDER — VINBLASTINE SULFATE CHEMO INJECTION 1 MG/ML
5.8000 mg/m2 | Freq: Once | INTRAVENOUS | Status: AC
  Administered 2019-10-02: 14:00:00 10 mg via INTRAVENOUS
  Filled 2019-10-02: qty 10

## 2019-10-02 MED ORDER — DIPHENHYDRAMINE HCL 50 MG/ML IJ SOLN
50.0000 mg | Freq: Once | INTRAMUSCULAR | Status: AC
  Administered 2019-10-02: 12:00:00 50 mg via INTRAVENOUS

## 2019-10-02 MED ORDER — SODIUM CHLORIDE 0.9 % IV SOLN
Freq: Once | INTRAVENOUS | Status: AC
  Filled 2019-10-02: qty 250

## 2019-10-02 MED ORDER — DOXORUBICIN HCL CHEMO IV INJECTION 2 MG/ML
25.0000 mg/m2 | Freq: Once | INTRAVENOUS | Status: AC
  Administered 2019-10-02: 14:00:00 44 mg via INTRAVENOUS
  Filled 2019-10-02: qty 22

## 2019-10-02 MED ORDER — PALONOSETRON HCL INJECTION 0.25 MG/5ML
INTRAVENOUS | Status: AC
  Filled 2019-10-02: qty 5

## 2019-10-02 MED ORDER — ACETAMINOPHEN 325 MG PO TABS
650.0000 mg | ORAL_TABLET | Freq: Once | ORAL | Status: AC
  Administered 2019-10-02: 650 mg via ORAL

## 2019-10-02 MED ORDER — SODIUM CHLORIDE 0.9% FLUSH
10.0000 mL | INTRAVENOUS | Status: DC | PRN
  Administered 2019-10-02: 16:00:00 10 mL
  Filled 2019-10-02: qty 10

## 2019-10-02 MED ORDER — SODIUM CHLORIDE 0.9 % IV SOLN
1.2000 mg/kg | Freq: Once | INTRAVENOUS | Status: AC
  Administered 2019-10-02: 15:00:00 75 mg via INTRAVENOUS
  Filled 2019-10-02: qty 15

## 2019-10-02 MED ORDER — SODIUM CHLORIDE 0.9 % IV SOLN
375.0000 mg/m2 | Freq: Once | INTRAVENOUS | Status: AC
  Administered 2019-10-02: 650 mg via INTRAVENOUS
  Filled 2019-10-02: qty 65

## 2019-10-02 MED ORDER — DIPHENHYDRAMINE HCL 50 MG/ML IJ SOLN
INTRAMUSCULAR | Status: AC
  Filled 2019-10-02: qty 1

## 2019-10-02 MED ORDER — SODIUM CHLORIDE 0.9 % IV SOLN
Freq: Once | INTRAVENOUS | Status: AC
  Filled 2019-10-02: qty 5

## 2019-10-02 MED ORDER — PALONOSETRON HCL INJECTION 0.25 MG/5ML
0.2500 mg | Freq: Once | INTRAVENOUS | Status: AC
  Administered 2019-10-02: 12:00:00 0.25 mg via INTRAVENOUS

## 2019-10-02 MED ORDER — HEPARIN SOD (PORK) LOCK FLUSH 100 UNIT/ML IV SOLN
500.0000 [IU] | Freq: Once | INTRAVENOUS | Status: AC | PRN
  Administered 2019-10-02: 16:00:00 500 [IU]
  Filled 2019-10-02: qty 5

## 2019-10-02 MED ORDER — SODIUM CHLORIDE 0.9% FLUSH
10.0000 mL | INTRAVENOUS | Status: DC | PRN
  Administered 2019-10-02: 10 mL
  Filled 2019-10-02: qty 10

## 2019-10-02 NOTE — Patient Instructions (Signed)
[facility] Discharge Instructions for Patients Receiving Chemotherapy  Today you received the following chemotherapy agents :  Doxorubicin, Velban, Dacarbazine, Brentuximab.  To help prevent nausea and vomiting after your treatment, we encourage you to take your nausea medication as prescribed.   If you develop nausea and vomiting that is not controlled by your nausea medication, call the clinic.   BELOW ARE SYMPTOMS THAT SHOULD BE REPORTED IMMEDIATELY:  *FEVER GREATER THAN 100.5 F  *CHILLS WITH OR WITHOUT FEVER  NAUSEA AND VOMITING THAT IS NOT CONTROLLED WITH YOUR NAUSEA MEDICATION  *UNUSUAL SHORTNESS OF BREATH  *UNUSUAL BRUISING OR BLEEDING  TENDERNESS IN MOUTH AND THROAT WITH OR WITHOUT PRESENCE OF ULCERS  *URINARY PROBLEMS  *BOWEL PROBLEMS  UNUSUAL RASH Items with * indicate a potential emergency and should be followed up as soon as possible.  Feel free to call the clinic should you have any questions or concerns. The clinic phone number is [phone].  Please show the [facility] at check-in to the Emergency Department and triage nurse.

## 2019-10-02 NOTE — Progress Notes (Signed)
Nutrition  Provided complimentary case of ensure enlive to patient in infusion room.  Isola Mehlman B. Zenia Resides, [place], [place] Registered Dietitian [phone] (pager)

## 2019-10-03 ENCOUNTER — Telehealth: Payer: Self-pay | Admitting: Internal Medicine

## 2019-10-03 NOTE — Telephone Encounter (Signed)
Scheduled per los. Called and left msg. Mailed printout  °

## 2019-10-04 ENCOUNTER — Inpatient Hospital Stay: Payer: Medicaid Other

## 2019-10-04 ENCOUNTER — Telehealth: Payer: Self-pay

## 2019-10-05 ENCOUNTER — Other Ambulatory Visit: Payer: Self-pay

## 2019-10-05 ENCOUNTER — Inpatient Hospital Stay: Payer: Medicaid Other

## 2019-10-05 VITALS — BP 153/86 | HR 72 | Temp 98.9°F | Resp 18

## 2019-10-05 DIAGNOSIS — C8118 Nodular sclerosis classical Hodgkin lymphoma, lymph nodes of multiple sites: Secondary | ICD-10-CM

## 2019-10-05 DIAGNOSIS — Z5112 Encounter for antineoplastic immunotherapy: Secondary | ICD-10-CM | POA: Diagnosis not present

## 2019-10-05 MED ORDER — PEGFILGRASTIM-CBQV 6 MG/0.6ML ~~LOC~~ SOSY
6.0000 mg | PREFILLED_SYRINGE | Freq: Once | SUBCUTANEOUS | Status: AC
  Administered 2019-10-05: 6 mg via SUBCUTANEOUS

## 2019-10-05 MED ORDER — PEGFILGRASTIM-CBQV 6 MG/0.6ML ~~LOC~~ SOSY
PREFILLED_SYRINGE | SUBCUTANEOUS | Status: AC
  Filled 2019-10-05: qty 0.6

## 2019-10-05 NOTE — Patient Instructions (Signed)

## 2019-10-15 NOTE — Progress Notes (Signed)
[facility] OFFICE PROGRESS NOTE  Patient, No Pcp Per No address on file  DIAGNOSIS: Stage IVB Hodgkin's Lymphoma, nodular sclerosis subtype. He presented with a bulky left cervical and supraclavicular lymphadenopathy as well as involvement of the spleen, nodal disease in the porta hepatis, and extensive disease involving the marrow of the pelvis, spine, and ribs. He was diagnosed in September 2020.   IPS score: 4  PRIOR THERAPY: None  CURRENT THERAPY: Systemic chemotherapy with brentuximab vedotin 1.2 mg/kg, doxorubicin 25 mg/m2, vinblastine 6 mg/m2, dacarbazine 375 mg/m2 on days 1 and 15 every 28 days. First dose expected on 05/30/2019.Status postday 15 cycle 5.   INTERVAL HISTORY: Jesse [name] 55 y.o. male returns to the clinic for a follow up visit. The patient is feeling well today without any concerning complaints except he developed peripheral neuropathy bilaterally in his feet since his last treatment. He notes persistent tingling. He describes an episode of significant burning in his feet at night a few nights ago. He states he had to take his feet out from under the covers due to them feeling like they were burning. He denies peripheral neuropathy in his hands.  Otherwise, he still experiences myalgias/arthralgias secondary to neulasta for which he takes claritin and ibuprofen. He experiences fatigue for approximately 1-2 days following treatment.Denies any fever, chills, night sweats, or weight loss. He states he has been drinking ~2 boost/ensures daily.Denies any shortness of breath, cough, or hemoptysis. Denies any nausea, vomiting, diarrhea, or constipation. The patient is here today for evaluation prior to starting day 1 cycle 6.   MEDICAL HISTORY: Past Medical History:  Diagnosis Date  . ETOH abuse   . Lymphadenopathy    left  . Marijuana abuse     ALLERGIES:  has No Known Allergies.  MEDICATIONS:  Current Outpatient Medications   Medication Sig Dispense Refill  . acetaminophen (TYLENOL) 500 MG tablet Take 1,000 mg by mouth every 6 (six) hours as needed.    Marland Kitchen allopurinol (ZYLOPRIM) 100 MG tablet Take 1 tablet (100 mg total) by mouth 2 (two) times daily. 60 tablet 2  . gabapentin (NEURONTIN) 100 MG capsule Take 1 capsule (100 mg total) by mouth 3 (three) times daily. 90 capsule 2  . ibuprofen (ADVIL) 800 MG tablet Take 800 mg by mouth every 8 (eight) hours as needed.    . lidocaine-prilocaine (EMLA) cream Apply 1 application topically as needed. Apply to port site  45 minutes prior to port being accessed 30 g 1  . prochlorperazine (COMPAZINE) 10 MG tablet Take 1 tablet (10 mg total) by mouth every 8 (eight) hours as needed for nausea or vomiting. 30 tablet 2   No current facility-administered medications for this visit.   Facility-Administered Medications Ordered in Other Visits  Medication Dose Route Frequency Provider Last Rate Last Admin  . brentuximab vedotin (ADCETRIS) 75 mg in sodium chloride 0.9 % 100 mL chemo infusion  1.2 mg/kg (Treatment Plan Recorded) Intravenous Once Curt Bears, MD      . dacarbazine (DTIC) 650 mg in sodium chloride 0.9 % 250 mL chemo infusion  375 mg/m2 (Treatment Plan Recorded) Intravenous Once Curt Bears, MD      . DOXOrubicin (ADRIAMYCIN) chemo injection 44 mg  25 mg/m2 (Treatment Plan Recorded) Intravenous Once Curt Bears, MD      . fosaprepitant (EMEND) 150 mg, dexamethasone (DECADRON) 12 mg in sodium chloride 0.9 % 145 mL IVPB   Intravenous Once Curt Bears, MD      . heparin  lock flush 100 unit/mL  500 Units Intracatheter Once PRN Curt Bears, MD      . sodium chloride flush (NS) 0.9 % injection 10 mL  10 mL Intracatheter PRN Curt Bears, MD      . vinBLAStine (VELBAN) 10 mg in sodium chloride 0.9 % 50 mL chemo infusion  5.8 mg/m2 (Treatment Plan Recorded) Intravenous Once Curt Bears, MD        SURGICAL HISTORY:  Past Surgical History:   Procedure Laterality Date  . HERNIA REPAIR     as child  . IR IMAGING GUIDED PORT INSERTION  05/29/2019  . LYMPH NODE BIOPSY Left 05/07/2019   Procedure: LYMPH NODE BIOPSY;  Surgeon: Melida Quitter, MD;  Location: [facility];  Service: ENT;  Laterality: Left;    REVIEW OF SYSTEMS:   Review of Systems  Constitutional: Positive for fatigue and decreased appetite (1-2 days following treatment). Negative for chills, fever and unexpected weight change.  HENT: Negative for mouth sores, nosebleeds, sore throat and trouble swallowing.   Eyes: Negative for eye problems and icterus.  Respiratory: Negative for cough, hemoptysis, shortness of breath and wheezing.   Cardiovascular: Negative for chest pain and leg swelling.  Gastrointestinal: Negative for abdominal pain, constipation, diarrhea, nausea and vomiting.  Genitourinary: Negative for bladder incontinence, difficulty urinating, dysuria, frequency and hematuria.   Musculoskeletal: Positive for myalgias/arthralgias secondary to neulasta. Negative for back pain, gait problem, neck pain and neck stiffness.  Skin: Negative for itching and rash.  Neurological: Positive for neuropathy bilaterally in lower extremities. Negative for dizziness, extremity weakness, gait problem, headaches, light-headedness and seizures.  Hematological: Negative for adenopathy. Does not bruise/bleed easily.  Psychiatric/Behavioral: Negative for confusion, depression and sleep disturbance. The patient is not nervous/anxious.     PHYSICAL EXAMINATION:  Blood pressure 123/85, pulse 76, temperature 98.2 F (36.8 C), temperature source Oral, resp. rate 18, height 5\' 6"  (1.676 m), weight 140 lb 14.4 oz (63.9 kg), SpO2 100 %.  ECOG PERFORMANCE STATUS: 1 - Symptomatic but completely ambulatory  Physical Exam  Constitutional: Oriented to person, place, and time and well-developed, well-nourished, and in no distress.  HENT:  Head: Normocephalic and atraumatic.   Mouth/Throat: Oropharynx is clear and moist. No oropharyngeal exudate.  Eyes: Conjunctivae are normal. Right eye exhibits no discharge. Left eye exhibits no discharge. No scleral icterus.  Neck: Normal range of motion. Neck supple.  Cardiovascular: Normal rate, regular rhythm, normal heart sounds and intact distal pulses.   Pulmonary/Chest: Effort normal. Quiet breath sounds in all lung fields. No respiratory distress. No wheezes. No rales.  Abdominal: Soft. Bowel sounds are normal. Exhibits no distension and no mass. There is no tenderness.  Musculoskeletal: Normal range of motion. Exhibits no edema.  Lymphadenopathy:    No cervical adenopathy.  Neurological: Alert and oriented to person, place, and time. Exhibits normal muscle tone. Gait normal. Coordination normal.  Skin: Skin is warm and dry. No rash noted. Not diaphoretic. No erythema. No pallor.  Psychiatric: Mood, memory and judgment normal.  Vitals reviewed.  LABORATORY DATA: Lab Results  Component Value Date   WBC 24.9 (H) 10/17/2019   HGB 11.5 (L) 10/17/2019   HCT 35.2 (L) 10/17/2019   MCV 92.1 10/17/2019   PLT 327 10/17/2019      Chemistry      Component Value Date/Time   NA 140 10/17/2019 1019   K 4.4 10/17/2019 1019   CL 106 10/17/2019 1019   CO2 26 10/17/2019 1019   BUN 16 10/17/2019 1019  CREATININE 0.82 10/17/2019 1019      Component Value Date/Time   CALCIUM 8.6 (L) 10/17/2019 1019   ALKPHOS 151 (H) 10/17/2019 1019   AST 17 10/17/2019 1019   ALT 18 10/17/2019 1019   BILITOT 0.3 10/17/2019 1019       RADIOGRAPHIC STUDIES:  No results found.   ASSESSMENT/PLAN:  This is a very pleasant 54years old African-American male recently diagnosed with a stage IV non-Hodgkin lymphoma, nodular sclerosing subtype. He presented with a bulky left cervical and supraclavicular lymphadenopathy as well as involvement of the spleen, nodal disease in the porta hepatis, and extensive disease involving the marrow of the  pelvis, spine, and ribs. He was diagnosed in September 2020.  The patient is currently undergoing systemic chemotherapy with brentuximab Vedotin in addition to doxorubicin, vinblastine and dacarbazine. Status postday 15 cycle 5. Hehas been toleratinghis treatment fairly well but notes some peripheral neuropathy. Discussed with Dr. Julien Nordmann who recommended proceeding on the current treatment for now. The patient is in agreement.  Labs were reviewed. Recommend that he proceed with cycle #6 today as scheduled.   We will see him back for a follow up visit in 2 weeks for evaluation before starting his last cycle with day 15 cycle 6.   He will continue to use ibuprofen and Claritin for his pain secondary to neulasta.   I sent 100 mg TID of gabapentin to his pharmacy for the neuropathy.   The patient was advised to call immediately if he has any concerning symptoms in the interval. The patient voices understanding of current disease status and treatment options and is in agreement with the current care plan. All questions were answered. The patient knows to call the clinic with any problems, questions or concerns. We can certainly see the patient much sooner if necessary  Orders Placed This Encounter  Procedures  . CBC with Differential (Cancer Center Only)    Standing Status:   Future    Standing Expiration Date:   10/16/2020  . CMP ([facility] only)    Standing Status:   Future    Standing Expiration Date:   10/16/2020  . Lactate dehydrogenase (LDH)    Standing Status:   Future    Standing Expiration Date:   10/16/2020     Becket Wecker L Mahlon Gabrielle, PA-C 10/17/19

## 2019-10-17 ENCOUNTER — Other Ambulatory Visit: Payer: Self-pay

## 2019-10-17 ENCOUNTER — Inpatient Hospital Stay: Payer: Medicaid Other

## 2019-10-17 ENCOUNTER — Encounter: Payer: Self-pay | Admitting: Nutrition

## 2019-10-17 ENCOUNTER — Encounter: Payer: Self-pay | Admitting: Licensed Clinical Social Worker

## 2019-10-17 ENCOUNTER — Encounter: Payer: Self-pay | Admitting: Physician Assistant

## 2019-10-17 ENCOUNTER — Inpatient Hospital Stay: Payer: Medicaid Other | Attending: Internal Medicine | Admitting: Physician Assistant

## 2019-10-17 VITALS — BP 123/85 | HR 76 | Temp 98.2°F | Resp 18 | Ht 66.0 in | Wt 140.9 lb

## 2019-10-17 DIAGNOSIS — Z5111 Encounter for antineoplastic chemotherapy: Secondary | ICD-10-CM | POA: Insufficient documentation

## 2019-10-17 DIAGNOSIS — M2550 Pain in unspecified joint: Secondary | ICD-10-CM | POA: Insufficient documentation

## 2019-10-17 DIAGNOSIS — G629 Polyneuropathy, unspecified: Secondary | ICD-10-CM | POA: Diagnosis not present

## 2019-10-17 DIAGNOSIS — Z5189 Encounter for other specified aftercare: Secondary | ICD-10-CM | POA: Insufficient documentation

## 2019-10-17 DIAGNOSIS — R5383 Other fatigue: Secondary | ICD-10-CM | POA: Insufficient documentation

## 2019-10-17 DIAGNOSIS — C8118 Nodular sclerosis classical Hodgkin lymphoma, lymph nodes of multiple sites: Secondary | ICD-10-CM

## 2019-10-17 DIAGNOSIS — M791 Myalgia, unspecified site: Secondary | ICD-10-CM | POA: Insufficient documentation

## 2019-10-17 DIAGNOSIS — Z79899 Other long term (current) drug therapy: Secondary | ICD-10-CM | POA: Insufficient documentation

## 2019-10-17 DIAGNOSIS — Z5112 Encounter for antineoplastic immunotherapy: Secondary | ICD-10-CM | POA: Diagnosis not present

## 2019-10-17 DIAGNOSIS — G6289 Other specified polyneuropathies: Secondary | ICD-10-CM

## 2019-10-17 DIAGNOSIS — Z95828 Presence of other vascular implants and grafts: Secondary | ICD-10-CM

## 2019-10-17 LAB — CBC WITH DIFFERENTIAL (CANCER CENTER ONLY)
Abs Immature Granulocytes: 3.3 10*3/uL — ABNORMAL HIGH (ref 0.00–0.07)
Basophils Absolute: 0 10*3/uL (ref 0.0–0.1)
Basophils Relative: 0 %
Eosinophils Absolute: 0.2 10*3/uL (ref 0.0–0.5)
Eosinophils Relative: 1 %
HCT: 35.2 % — ABNORMAL LOW (ref 39.0–52.0)
Hemoglobin: 11.5 g/dL — ABNORMAL LOW (ref 13.0–17.0)
Immature Granulocytes: 13 %
Lymphocytes Relative: 6 %
Lymphs Abs: 1.6 10*3/uL (ref 0.7–4.0)
MCH: 30.1 pg (ref 26.0–34.0)
MCHC: 32.7 g/dL (ref 30.0–36.0)
MCV: 92.1 fL (ref 80.0–100.0)
Monocytes Absolute: 1.3 10*3/uL — ABNORMAL HIGH (ref 0.1–1.0)
Monocytes Relative: 5 %
Neutro Abs: 18.5 10*3/uL — ABNORMAL HIGH (ref 1.7–7.7)
Neutrophils Relative %: 75 %
Platelet Count: 327 10*3/uL (ref 150–400)
RBC: 3.82 MIL/uL — ABNORMAL LOW (ref 4.22–5.81)
RDW: 18 % — ABNORMAL HIGH (ref 11.5–15.5)
WBC Count: 24.9 10*3/uL — ABNORMAL HIGH (ref 4.0–10.5)
nRBC: 0.4 % — ABNORMAL HIGH (ref 0.0–0.2)

## 2019-10-17 LAB — LACTATE DEHYDROGENASE: LDH: 270 U/L — ABNORMAL HIGH (ref 98–192)

## 2019-10-17 LAB — CMP (CANCER CENTER ONLY)
ALT: 18 U/L (ref 0–44)
AST: 17 U/L (ref 15–41)
Albumin: 3.8 g/dL (ref 3.5–5.0)
Alkaline Phosphatase: 151 U/L — ABNORMAL HIGH (ref 38–126)
Anion gap: 8 (ref 5–15)
BUN: 16 mg/dL (ref 6–20)
CO2: 26 mmol/L (ref 22–32)
Calcium: 8.6 mg/dL — ABNORMAL LOW (ref 8.9–10.3)
Chloride: 106 mmol/L (ref 98–111)
Creatinine: 0.82 mg/dL (ref 0.61–1.24)
GFR, Est AFR Am: >60 mL/min (ref >60)
GFR, Estimated: >60 mL/min (ref >60)
Glucose, Bld: 95 mg/dL (ref 70–99)
Potassium: 4.4 mmol/L (ref 3.5–5.1)
Sodium: 140 mmol/L (ref 135–145)
Total Bilirubin: 0.3 mg/dL (ref 0.3–1.2)
Total Protein: 6.9 g/dL (ref 6.5–8.1)

## 2019-10-17 MED ORDER — HEPARIN SOD (PORK) LOCK FLUSH 100 UNIT/ML IV SOLN
500.0000 [IU] | Freq: Once | INTRAVENOUS | Status: AC | PRN
  Administered 2019-10-17: 500 [IU]
  Filled 2019-10-17: qty 5

## 2019-10-17 MED ORDER — SODIUM CHLORIDE 0.9 % IV SOLN
Freq: Once | INTRAVENOUS | Status: AC
  Filled 2019-10-17: qty 250

## 2019-10-17 MED ORDER — ACETAMINOPHEN 325 MG PO TABS
650.0000 mg | ORAL_TABLET | Freq: Once | ORAL | Status: AC
  Administered 2019-10-17: 650 mg via ORAL

## 2019-10-17 MED ORDER — DOXORUBICIN HCL CHEMO IV INJECTION 2 MG/ML
25.0000 mg/m2 | Freq: Once | INTRAVENOUS | Status: AC
  Administered 2019-10-17: 44 mg via INTRAVENOUS
  Filled 2019-10-17: qty 22

## 2019-10-17 MED ORDER — PALONOSETRON HCL INJECTION 0.25 MG/5ML
INTRAVENOUS | Status: AC
  Filled 2019-10-17: qty 5

## 2019-10-17 MED ORDER — SODIUM CHLORIDE 0.9% FLUSH
10.0000 mL | INTRAVENOUS | Status: DC | PRN
  Administered 2019-10-17: 10 mL
  Filled 2019-10-17: qty 10

## 2019-10-17 MED ORDER — DIPHENHYDRAMINE HCL 50 MG/ML IJ SOLN
INTRAMUSCULAR | Status: AC
  Filled 2019-10-17: qty 1

## 2019-10-17 MED ORDER — ACETAMINOPHEN 325 MG PO TABS
ORAL_TABLET | ORAL | Status: AC
  Filled 2019-10-17: qty 2

## 2019-10-17 MED ORDER — VINBLASTINE SULFATE CHEMO INJECTION 1 MG/ML
5.8000 mg/m2 | Freq: Once | INTRAVENOUS | Status: AC
  Administered 2019-10-17: 10 mg via INTRAVENOUS
  Filled 2019-10-17: qty 10

## 2019-10-17 MED ORDER — SODIUM CHLORIDE 0.9 % IV SOLN
375.0000 mg/m2 | Freq: Once | INTRAVENOUS | Status: AC
  Administered 2019-10-17: 650 mg via INTRAVENOUS
  Filled 2019-10-17: qty 65

## 2019-10-17 MED ORDER — SODIUM CHLORIDE 0.9 % IV SOLN
1.2000 mg/kg | Freq: Once | INTRAVENOUS | Status: AC
  Administered 2019-10-17: 75 mg via INTRAVENOUS
  Filled 2019-10-17: qty 15

## 2019-10-17 MED ORDER — DIPHENHYDRAMINE HCL 50 MG/ML IJ SOLN
50.0000 mg | Freq: Once | INTRAMUSCULAR | Status: AC
  Administered 2019-10-17: 50 mg via INTRAVENOUS

## 2019-10-17 MED ORDER — GABAPENTIN 100 MG PO CAPS: 100.0000 mg | ORAL_CAPSULE | Freq: Three times a day (TID) | ORAL | 2 refills | Status: DC

## 2019-10-17 MED ORDER — PALONOSETRON HCL INJECTION 0.25 MG/5ML
0.2500 mg | Freq: Once | INTRAVENOUS | Status: AC
  Administered 2019-10-17: 0.25 mg via INTRAVENOUS

## 2019-10-17 MED ORDER — SODIUM CHLORIDE 0.9 % IV SOLN
Freq: Once | INTRAVENOUS | Status: AC
  Filled 2019-10-17: qty 5

## 2019-10-17 MED FILL — GABAPENTIN 100 MG CAPSULE: 100 | 30 days supply | Qty: 90 | Fill #0

## 2019-10-17 NOTE — Patient Instructions (Signed)
[facility] Discharge Instructions for Patients Receiving Chemotherapy  Today you received the following chemotherapy agents :  Doxorubicin, Velban, Dacarbazine, Brentuximab.  To help prevent nausea and vomiting after your treatment, we encourage you to take your nausea medication as prescribed.   If you develop nausea and vomiting that is not controlled by your nausea medication, call the clinic.   BELOW ARE SYMPTOMS THAT SHOULD BE REPORTED IMMEDIATELY:  *FEVER GREATER THAN 100.5 F  *CHILLS WITH OR WITHOUT FEVER  NAUSEA AND VOMITING THAT IS NOT CONTROLLED WITH YOUR NAUSEA MEDICATION  *UNUSUAL SHORTNESS OF BREATH  *UNUSUAL BRUISING OR BLEEDING  TENDERNESS IN MOUTH AND THROAT WITH OR WITHOUT PRESENCE OF ULCERS  *URINARY PROBLEMS  *BOWEL PROBLEMS  UNUSUAL RASH Items with * indicate a potential emergency and should be followed up as soon as possible.  Feel free to call the clinic should you have any questions or concerns. The clinic phone number is [phone].  Please show the [facility] at check-in to the Emergency Department and triage nurse.

## 2019-10-17 NOTE — Progress Notes (Signed)
[place] Work  Clinical Social Work met with patient during infusion. Provided with 3rd installment of [employer].   Mr. Sage also had papers that were missing for his Medicaid application.  Could not remember who they needed to go to. CSW checked with L. Somers and L. White to determine if application had been started here. They had not. Per previous note, Mr. Vangilder had applied through DSS. CSW recommended he contact his case worker through [place].    Chizuko Trine, [place], [place] Worker [facility]

## 2019-10-17 NOTE — Progress Notes (Signed)
Provided 1 complementary case of Ensure Enlive. 

## 2019-10-18 ENCOUNTER — Telehealth: Payer: Self-pay | Admitting: Physician Assistant

## 2019-10-18 NOTE — Telephone Encounter (Signed)
Per 3/3 los no changes to be made to pt's schedule.

## 2019-10-19 ENCOUNTER — Inpatient Hospital Stay: Payer: Medicaid Other

## 2019-10-19 ENCOUNTER — Other Ambulatory Visit: Payer: Self-pay

## 2019-10-19 VITALS — BP 120/93 | HR 76 | Temp 97.8°F | Resp 18

## 2019-10-19 DIAGNOSIS — C8118 Nodular sclerosis classical Hodgkin lymphoma, lymph nodes of multiple sites: Secondary | ICD-10-CM

## 2019-10-19 DIAGNOSIS — Z5112 Encounter for antineoplastic immunotherapy: Secondary | ICD-10-CM | POA: Diagnosis not present

## 2019-10-19 MED ORDER — PEGFILGRASTIM-CBQV 6 MG/0.6ML ~~LOC~~ SOSY
PREFILLED_SYRINGE | SUBCUTANEOUS | Status: AC
  Filled 2019-10-19: qty 0.6

## 2019-10-19 MED ORDER — PEGFILGRASTIM-CBQV 6 MG/0.6ML ~~LOC~~ SOSY
6.0000 mg | PREFILLED_SYRINGE | Freq: Once | SUBCUTANEOUS | Status: AC
  Administered 2019-10-19: 6 mg via SUBCUTANEOUS

## 2019-10-19 NOTE — Patient Instructions (Signed)

## 2019-10-19 NOTE — Telephone Encounter (Signed)
Error

## 2019-10-30 NOTE — Progress Notes (Signed)
[facility] OFFICE PROGRESS NOTE  Patient, No Pcp Per No address on file  DIAGNOSIS: Stage IVB Hodgkin's Lymphoma, nodular sclerosis subtype. He presented with a bulky left cervical and supraclavicular lymphadenopathy as well as involvement of the spleen, nodal disease in the porta hepatis, and extensive disease involving the marrow of the pelvis, spine, and ribs. He was diagnosed in September 2020.   IPS score: 4  PRIOR THERAPY:  None  CURRENT THERAPY: Systemic chemotherapy with brentuximab vedotin 1.2 mg/kg, doxorubicin 25 mg/m2, vinblastine 6 mg/m2, dacarbazine 375 mg/m2 on days 1 and 15 every 28 days. First dose expected on 05/30/2019.Status postday 1 cycle 6.  INTERVAL HISTORY: Jesse [name] 54 y.o. male returns to the clinic for a follow up visit. The patient is feeling well without any concerning complaints. At his last appointment, he was experiencing of neuropathy in his feet bilaterally. He was started on gabapentin 100 mg TID. His neuropathy is similar to slightly improved at this time. Otherwise, he experiences myalgias and arthralgias secondary to his neulasta injection.Denies any fever, chills, night sweats, or weight loss. He states he has been drinking ~2 boost/ensures daily.A member of the nutrition team will be evaluating him while he is in infusion today. Denies any shortness of breath, cough, or hemoptysis. Denies any nausea, vomiting, diarrhea, or constipation. The patient is here today for evaluation prior to startingday 15 cycle 6.  MEDICAL HISTORY: Past Medical History:  Diagnosis Date  . ETOH abuse   . Lymphadenopathy    left  . Marijuana abuse     ALLERGIES:  has No Known Allergies.  MEDICATIONS:  Current Outpatient Medications  Medication Sig Dispense Refill  . acetaminophen (TYLENOL) 500 MG tablet Take 1,000 mg by mouth every 6 (six) hours as needed.    Marland Kitchen allopurinol (ZYLOPRIM) 100 MG tablet Take 1 tablet (100 mg total) by  mouth 2 (two) times daily. 60 tablet 2  . gabapentin (NEURONTIN) 100 MG capsule Take 1 capsule (100 mg total) by mouth 3 (three) times daily. 90 capsule 2  . ibuprofen (ADVIL) 800 MG tablet Take 800 mg by mouth every 8 (eight) hours as needed.    . lidocaine-prilocaine (EMLA) cream Apply 1 application topically as needed. Apply to port site  45 minutes prior to port being accessed 30 g 1  . prochlorperazine (COMPAZINE) 10 MG tablet Take 1 tablet (10 mg total) by mouth every 8 (eight) hours as needed for nausea or vomiting. 30 tablet 2   No current facility-administered medications for this visit.    SURGICAL HISTORY:  Past Surgical History:  Procedure Laterality Date  . HERNIA REPAIR     as child  . IR IMAGING GUIDED PORT INSERTION  05/29/2019  . LYMPH NODE BIOPSY Left 05/07/2019   Procedure: LYMPH NODE BIOPSY;  Surgeon: Melida Quitter, MD;  Location: [facility];  Service: ENT;  Laterality: Left;    REVIEW OF SYSTEMS:   Review of Systems  Constitutional: Positive for fatigue and decreased appetite (1-2 days following treatment). Negative for chills, fever and unexpected weight change.  HENT: Negative for mouth sores, nosebleeds, sore throat and trouble swallowing.   Eyes: Negative for eye problems and icterus.  Respiratory: Negative for cough, hemoptysis, shortness of breath and wheezing.  Cardiovascular: Negative for chest pain and leg swelling.  Gastrointestinal: Negative for abdominal pain, constipation, diarrhea, nausea and vomiting.  Genitourinary: Negative for bladder incontinence, difficulty urinating, dysuria, frequency and hematuria.   Musculoskeletal: Positive for myalgias/arthralgias secondary to  neulasta. Negative for back pain, gait problem, neck pain and neck stiffness.  Skin: Negative for itching and rash.  Neurological: Positive for neuropathy bilaterally in lower extremities. Negative for dizziness, extremity weakness, gait problem, headaches,  light-headedness and seizures.  Hematological: Negative for adenopathy. Does not bruise/bleed easily.  Psychiatric/Behavioral: Negative for confusion, depression and sleep disturbance. The patient is not nervous/anxious.     PHYSICAL EXAMINATION:  There were no vitals taken for this visit.  ECOG PERFORMANCE STATUS: 1 - Symptomatic but completely ambulatory  Physical Exam  Constitutional: Oriented to person, place, and time and well-developed, well-nourished, and in no distress.  HENT:  Head: Normocephalic and atraumatic.  Mouth/Throat: Oropharynx is clear and moist. No oropharyngeal exudate.  Eyes: Conjunctivae are normal. Right eye exhibits no discharge. Left eye exhibits no discharge. No scleral icterus.  Neck: Normal range of motion. Neck supple.  Cardiovascular: Normal rate, regular rhythm, normal heart sounds and intact distal pulses.   Pulmonary/Chest: Effort normal. Quiet breath sounds in all lung fields. No respiratory distress. No wheezes. No rales.  Abdominal: Soft. Bowel sounds are normal. Exhibits no distension and no mass. There is no tenderness.  Musculoskeletal: Normal range of motion. Exhibits no edema.  Lymphadenopathy:    No cervical adenopathy (improved).  Neurological: Alert and oriented to person, place, and time. Exhibits normal muscle tone. Gait normal. Coordination normal.  Skin: Skin is warm and dry. No rash noted. Not diaphoretic. No erythema. No pallor.  Psychiatric: Mood, memory and judgment normal.  Vitals reviewed.  LABORATORY DATA: Lab Results  Component Value Date   WBC 24.9 (H) 10/17/2019   HGB 11.5 (L) 10/17/2019   HCT 35.2 (L) 10/17/2019   MCV 92.1 10/17/2019   PLT 327 10/17/2019      Chemistry      Component Value Date/Time   NA 140 10/17/2019 1019   K 4.4 10/17/2019 1019   CL 106 10/17/2019 1019   CO2 26 10/17/2019 1019   BUN 16 10/17/2019 1019   CREATININE 0.82 10/17/2019 1019      Component Value Date/Time   CALCIUM 8.6 (L)  10/17/2019 1019   ALKPHOS 151 (H) 10/17/2019 1019   AST 17 10/17/2019 1019   ALT 18 10/17/2019 1019   BILITOT 0.3 10/17/2019 1019       RADIOGRAPHIC STUDIES:  No results found.   ASSESSMENT/PLAN:  This is a very pleasant 54years old African-American male recently diagnosed with a stage IV non-Hodgkin lymphoma, nodular sclerosing subtype. He presented with a bulky left cervical and supraclavicular lymphadenopathy as well as involvement of the spleen, nodal disease in the porta hepatis, and extensive disease involving the marrow of the pelvis, spine, and ribs. He was diagnosed in September 2020.  The patient is currently undergoing systemic chemotherapy with brentuximab Vedotin in addition to doxorubicin, vinblastine and dacarbazine. Status postday 1 cycle 6.Hehas been toleratinghis treatment fairly well but notes some peripheral neuropathy.   Discussed with Dr. Julien Nordmann who recommended proceeding on the current treatment for now. The patient is in agreement.  Labs were reviewed. Recommend that he proceed with his last dose day 15 cycle #6 today as scheduled.   I will arrange for a restaging PET scan to be performed prior to his next visit in 2-3 weeks.   We will see him back for a follow up visit in 2-3 weeks for evaluation and to review his scan.   He will continue to use ibuprofen and Claritin for his pain secondary to neulasta.  He will continue  to take gabapentin for his peripheral neuropathy.   The patient was advised to call immediately if he has any concerning symptoms in the interval. The patient voices understanding of current disease status and treatment options and is in agreement with the current care plan. All questions were answered. The patient knows to call the clinic with any problems, questions or concerns. We can certainly see the patient much sooner if necessary       No orders of the defined types were placed in this encounter.    Shalan Neault L  Ethyle Tiedt, PA-C 10/30/19

## 2019-10-31 ENCOUNTER — Inpatient Hospital Stay: Payer: Medicaid Other | Admitting: Nutrition

## 2019-10-31 ENCOUNTER — Inpatient Hospital Stay: Payer: Medicaid Other

## 2019-10-31 ENCOUNTER — Other Ambulatory Visit: Payer: Self-pay

## 2019-10-31 ENCOUNTER — Inpatient Hospital Stay (HOSPITAL_BASED_OUTPATIENT_CLINIC_OR_DEPARTMENT_OTHER): Payer: Medicaid Other | Admitting: Physician Assistant

## 2019-10-31 ENCOUNTER — Encounter: Payer: Self-pay | Admitting: General Practice

## 2019-10-31 VITALS — BP 126/82 | HR 85 | Temp 97.8°F | Resp 18 | Ht 66.0 in | Wt 139.3 lb

## 2019-10-31 DIAGNOSIS — C8118 Nodular sclerosis classical Hodgkin lymphoma, lymph nodes of multiple sites: Secondary | ICD-10-CM

## 2019-10-31 DIAGNOSIS — Z5111 Encounter for antineoplastic chemotherapy: Secondary | ICD-10-CM

## 2019-10-31 DIAGNOSIS — Z5112 Encounter for antineoplastic immunotherapy: Secondary | ICD-10-CM | POA: Diagnosis not present

## 2019-10-31 LAB — CMP (CANCER CENTER ONLY)
ALT: 18 U/L (ref 0–44)
AST: 19 U/L (ref 15–41)
Albumin: 4 g/dL (ref 3.5–5.0)
Alkaline Phosphatase: 141 U/L — ABNORMAL HIGH (ref 38–126)
Anion gap: 9 (ref 5–15)
BUN: 16 mg/dL (ref 6–20)
CO2: 25 mmol/L (ref 22–32)
Calcium: 8.8 mg/dL — ABNORMAL LOW (ref 8.9–10.3)
Chloride: 104 mmol/L (ref 98–111)
Creatinine: 0.71 mg/dL (ref 0.61–1.24)
GFR, Est AFR Am: >60 mL/min (ref >60)
GFR, Estimated: >60 mL/min (ref >60)
Glucose, Bld: 87 mg/dL (ref 70–99)
Potassium: 3.9 mmol/L (ref 3.5–5.1)
Sodium: 138 mmol/L (ref 135–145)
Total Bilirubin: 0.3 mg/dL (ref 0.3–1.2)
Total Protein: 7.1 g/dL (ref 6.5–8.1)

## 2019-10-31 LAB — CBC WITH DIFFERENTIAL (CANCER CENTER ONLY)
Abs Immature Granulocytes: 0.44 10*3/uL — ABNORMAL HIGH (ref 0.00–0.07)
Basophils Absolute: 0 10*3/uL (ref 0.0–0.1)
Basophils Relative: 0 %
Eosinophils Absolute: 0.1 10*3/uL (ref 0.0–0.5)
Eosinophils Relative: 1 %
HCT: 35.6 % — ABNORMAL LOW (ref 39.0–52.0)
Hemoglobin: 11.6 g/dL — ABNORMAL LOW (ref 13.0–17.0)
Immature Granulocytes: 5 %
Lymphocytes Relative: 13 %
Lymphs Abs: 1.2 10*3/uL (ref 0.7–4.0)
MCH: 30.2 pg (ref 26.0–34.0)
MCHC: 32.6 g/dL (ref 30.0–36.0)
MCV: 92.7 fL (ref 80.0–100.0)
Monocytes Absolute: 1 10*3/uL (ref 0.1–1.0)
Monocytes Relative: 10 %
Neutro Abs: 6.5 10*3/uL (ref 1.7–7.7)
Neutrophils Relative %: 71 %
Platelet Count: 324 10*3/uL (ref 150–400)
RBC: 3.84 MIL/uL — ABNORMAL LOW (ref 4.22–5.81)
RDW: 17.5 % — ABNORMAL HIGH (ref 11.5–15.5)
WBC Count: 9.3 10*3/uL (ref 4.0–10.5)
nRBC: 0 % (ref 0.0–0.2)

## 2019-10-31 LAB — LACTATE DEHYDROGENASE: LDH: 194 U/L — ABNORMAL HIGH (ref 98–192)

## 2019-10-31 MED ORDER — ACETAMINOPHEN 325 MG PO TABS
ORAL_TABLET | ORAL | Status: AC
  Filled 2019-10-31: qty 2

## 2019-10-31 MED ORDER — HEPARIN SOD (PORK) LOCK FLUSH 100 UNIT/ML IV SOLN
500.0000 [IU] | Freq: Once | INTRAVENOUS | Status: AC | PRN
  Administered 2019-10-31: 500 [IU]
  Filled 2019-10-31: qty 5

## 2019-10-31 MED ORDER — SODIUM CHLORIDE 0.9 % IV SOLN
Freq: Once | INTRAVENOUS | Status: AC
  Filled 2019-10-31: qty 250

## 2019-10-31 MED ORDER — DOXORUBICIN HCL CHEMO IV INJECTION 2 MG/ML
25.0000 mg/m2 | Freq: Once | INTRAVENOUS | Status: AC
  Administered 2019-10-31: 44 mg via INTRAVENOUS
  Filled 2019-10-31: qty 22

## 2019-10-31 MED ORDER — SODIUM CHLORIDE 0.9 % IV SOLN
375.0000 mg/m2 | Freq: Once | INTRAVENOUS | Status: AC
  Administered 2019-10-31: 650 mg via INTRAVENOUS
  Filled 2019-10-31: qty 65

## 2019-10-31 MED ORDER — PALONOSETRON HCL INJECTION 0.25 MG/5ML
INTRAVENOUS | Status: AC
  Filled 2019-10-31: qty 5

## 2019-10-31 MED ORDER — PALONOSETRON HCL INJECTION 0.25 MG/5ML
0.2500 mg | Freq: Once | INTRAVENOUS | Status: AC
  Administered 2019-10-31: 0.25 mg via INTRAVENOUS

## 2019-10-31 MED ORDER — ACETAMINOPHEN 325 MG PO TABS
650.0000 mg | ORAL_TABLET | Freq: Once | ORAL | Status: AC
  Administered 2019-10-31: 650 mg via ORAL

## 2019-10-31 MED ORDER — VINBLASTINE SULFATE CHEMO INJECTION 1 MG/ML
5.8000 mg/m2 | Freq: Once | INTRAVENOUS | Status: AC
  Administered 2019-10-31: 10 mg via INTRAVENOUS
  Filled 2019-10-31: qty 10

## 2019-10-31 MED ORDER — SODIUM CHLORIDE 0.9% FLUSH
10.0000 mL | INTRAVENOUS | Status: DC | PRN
  Administered 2019-10-31: 10 mL
  Filled 2019-10-31: qty 10

## 2019-10-31 MED ORDER — SODIUM CHLORIDE 0.9 % IV SOLN
Freq: Once | INTRAVENOUS | Status: AC
  Filled 2019-10-31: qty 5

## 2019-10-31 MED ORDER — DIPHENHYDRAMINE HCL 25 MG PO CAPS
ORAL_CAPSULE | ORAL | Status: AC
  Filled 2019-10-31: qty 2

## 2019-10-31 MED ORDER — DIPHENHYDRAMINE HCL 50 MG/ML IJ SOLN
INTRAMUSCULAR | Status: AC
  Filled 2019-10-31: qty 1

## 2019-10-31 MED ORDER — SODIUM CHLORIDE 0.9 % IV SOLN
1.2000 mg/kg | Freq: Once | INTRAVENOUS | Status: AC
  Administered 2019-10-31: 75 mg via INTRAVENOUS
  Filled 2019-10-31: qty 15

## 2019-10-31 MED ORDER — DIPHENHYDRAMINE HCL 50 MG/ML IJ SOLN
50.0000 mg | Freq: Once | INTRAMUSCULAR | Status: AC
  Administered 2019-10-31: 12:00:00 50 mg via INTRAVENOUS

## 2019-10-31 NOTE — Progress Notes (Addendum)
[facility] CSW Progress Notes  Fourth and final $50.00 disbursement provided from [employer].  Patient reports he was denied for Medicaid - applied in [facility], does not know caseworker name.  From what he remembers, caseworker told wife that "his disease might be curable" and thus he was ineligible for Medicaid which requires him to be disabled.  He also has a pending application for Social Security disability.  CSW discussed option to appeal Medicaid determination.  Also advised that he may be able to apply for [facility] financial assistance, should request application from his Estate manager/land agent.  Patient wants to return to work - he was previously employed doing brick work, yard work, grass mowing and similar.  Hopes he can regain his strength as he enjoys working.    Edwyna Shell, LCSW Clinical Social Worker Phone:  [phone] Cell:  [phone]

## 2019-10-31 NOTE — Progress Notes (Signed)
Brief nutrition follow-up completed with patient. Patient receives treatment for Hodgkin's lymphoma. Weight is stable at 139.3 pounds on March 17. Patient denies nutrition impact symptoms and has no questions or concerns.  Unintended weight loss has resolved. Encourage patient to contact me with any questions or concerns.  He has my contact information.  **Disclaimer: This note was dictated with voice recognition software. Similar sounding words can inadvertently be transcribed and this note may contain transcription errors which may not have been corrected upon publication of note.**

## 2019-10-31 NOTE — Patient Instructions (Addendum)
Jesse [name] Discharge Instructions for Patients Receiving Chemotherapy  Today you received the following chemotherapy agents Adriamycin, Velban, Dacarbazine, Brentuximab.  To help prevent nausea and vomiting after your treatment, we encourage you to take your nausea medication as directed.  If you develop nausea and vomiting that is not controlled by your nausea medication, call the clinic.   BELOW ARE SYMPTOMS THAT SHOULD BE REPORTED IMMEDIATELY:  *FEVER GREATER THAN 100.5 F  *CHILLS WITH OR WITHOUT FEVER  NAUSEA AND VOMITING THAT IS NOT CONTROLLED WITH YOUR NAUSEA MEDICATION  *UNUSUAL SHORTNESS OF BREATH  *UNUSUAL BRUISING OR BLEEDING  TENDERNESS IN MOUTH AND THROAT WITH OR WITHOUT PRESENCE OF ULCERS  *URINARY PROBLEMS  *BOWEL PROBLEMS  UNUSUAL RASH Items with * indicate a potential emergency and should be followed up as soon as possible.  Feel free to call the clinic should you have any questions or concerns. The clinic phone number is [phone].  Please show the [facility] at check-in to the Emergency Department and triage nurse.

## 2019-11-01 ENCOUNTER — Telehealth: Payer: Self-pay | Admitting: Internal Medicine

## 2019-11-01 NOTE — Telephone Encounter (Signed)
Scheduled per los. Called and spoke with patients wife. Confirmed appt  

## 2019-11-02 ENCOUNTER — Inpatient Hospital Stay: Payer: Medicaid Other

## 2019-11-02 ENCOUNTER — Other Ambulatory Visit: Payer: Self-pay

## 2019-11-02 VITALS — BP 135/95 | HR 85 | Temp 98.7°F | Resp 18

## 2019-11-02 DIAGNOSIS — Z5112 Encounter for antineoplastic immunotherapy: Secondary | ICD-10-CM | POA: Diagnosis not present

## 2019-11-02 DIAGNOSIS — C8118 Nodular sclerosis classical Hodgkin lymphoma, lymph nodes of multiple sites: Secondary | ICD-10-CM

## 2019-11-02 MED ORDER — PEGFILGRASTIM-CBQV 6 MG/0.6ML ~~LOC~~ SOSY
6.0000 mg | PREFILLED_SYRINGE | Freq: Once | SUBCUTANEOUS | Status: AC
  Administered 2019-11-02: 10:00:00 6 mg via SUBCUTANEOUS

## 2019-11-02 MED ORDER — PEGFILGRASTIM-CBQV 6 MG/0.6ML ~~LOC~~ SOSY
PREFILLED_SYRINGE | SUBCUTANEOUS | Status: AC
  Filled 2019-11-02: qty 0.6

## 2019-11-02 NOTE — Patient Instructions (Signed)

## 2019-11-07 ENCOUNTER — Encounter (HOSPITAL_COMMUNITY): Payer: Self-pay

## 2019-11-07 ENCOUNTER — Telehealth: Payer: Self-pay | Admitting: Medical Oncology

## 2019-11-07 ENCOUNTER — Emergency Department (HOSPITAL_COMMUNITY)
Admission: EM | Admit: 2019-11-07 | Discharge: 2019-11-07 | Disposition: A | Discharge status: Home / Self Care | Payer: Medicaid Other | Attending: Emergency Medicine | Admitting: Emergency Medicine

## 2019-11-07 ENCOUNTER — Other Ambulatory Visit: Payer: Self-pay

## 2019-11-07 DIAGNOSIS — R9431 Abnormal electrocardiogram [ECG] [EKG]: Secondary | ICD-10-CM | POA: Diagnosis not present

## 2019-11-07 DIAGNOSIS — C8190 Hodgkin lymphoma, unspecified, unspecified site: Secondary | ICD-10-CM | POA: Insufficient documentation

## 2019-11-07 DIAGNOSIS — Z87891 Personal history of nicotine dependence: Secondary | ICD-10-CM | POA: Diagnosis not present

## 2019-11-07 DIAGNOSIS — M791 Myalgia, unspecified site: Secondary | ICD-10-CM | POA: Diagnosis not present

## 2019-11-07 DIAGNOSIS — R52 Pain, unspecified: Secondary | ICD-10-CM | POA: Insufficient documentation

## 2019-11-07 LAB — CBC WITH DIFFERENTIAL/PLATELET
Abs Immature Granulocytes: 0.26 10*3/uL — ABNORMAL HIGH (ref 0.00–0.07)
Basophils Absolute: 0.1 10*3/uL (ref 0.0–0.1)
Basophils Relative: 0 %
Eosinophils Absolute: 0.1 10*3/uL (ref 0.0–0.5)
Eosinophils Relative: 1 %
HCT: 37.8 % — ABNORMAL LOW (ref 39.0–52.0)
Hemoglobin: 12 g/dL — ABNORMAL LOW (ref 13.0–17.0)
Immature Granulocytes: 2 %
Lymphocytes Relative: 7 %
Lymphs Abs: 0.9 10*3/uL (ref 0.7–4.0)
MCH: 30.5 pg (ref 26.0–34.0)
MCHC: 31.7 g/dL (ref 30.0–36.0)
MCV: 96.2 fL (ref 80.0–100.0)
Monocytes Absolute: 0.6 10*3/uL (ref 0.1–1.0)
Monocytes Relative: 4 %
Neutro Abs: 11.9 10*3/uL — ABNORMAL HIGH (ref 1.7–7.7)
Neutrophils Relative %: 86 %
Platelets: 215 10*3/uL (ref 150–400)
RBC: 3.93 MIL/uL — ABNORMAL LOW (ref 4.22–5.81)
RDW: 17.1 % — ABNORMAL HIGH (ref 11.5–15.5)
WBC: 13.8 10*3/uL — ABNORMAL HIGH (ref 4.0–10.5)
nRBC: 0 % (ref 0.0–0.2)

## 2019-11-07 LAB — BASIC METABOLIC PANEL
Anion gap: 9 (ref 5–15)
BUN: 17 mg/dL (ref 6–20)
CO2: 25 mmol/L (ref 22–32)
Calcium: 9.1 mg/dL (ref 8.9–10.3)
Chloride: 103 mmol/L (ref 98–111)
Creatinine, Ser: 0.68 mg/dL (ref 0.61–1.24)
GFR calc Af Amer: >60 mL/min (ref >60)
GFR calc non Af Amer: >60 mL/min (ref >60)
Glucose, Bld: 93 mg/dL (ref 70–99)
Potassium: 3.5 mmol/L (ref 3.5–5.1)
Sodium: 137 mmol/L (ref 135–145)

## 2019-11-07 MED ORDER — OXYCODONE-ACETAMINOPHEN 5-325 MG PO TABS
1.0000 | ORAL_TABLET | Freq: Once | ORAL | Status: AC
  Administered 2019-11-07: 1 via ORAL
  Filled 2019-11-07: qty 1

## 2019-11-07 MED ORDER — OXYCODONE-ACETAMINOPHEN 5-325 MG PO TABS: 1.0000 | ORAL_TABLET | Freq: Four times a day (QID) | ORAL | 0 refills | Status: DC | PRN

## 2019-11-07 MED ORDER — SODIUM CHLORIDE 0.9 % IV BOLUS
500.0000 mL | Freq: Once | INTRAVENOUS | Status: AC
  Administered 2019-11-07: 500 mL via INTRAVENOUS

## 2019-11-07 MED ORDER — LORATADINE 10 MG PO TABS
10.0000 mg | ORAL_TABLET | Freq: Once | ORAL | Status: AC
  Administered 2019-11-07: 10 mg via ORAL
  Filled 2019-11-07: qty 1

## 2019-11-07 MED ORDER — KETOROLAC TROMETHAMINE 30 MG/ML IJ SOLN
30.0000 mg | Freq: Once | INTRAMUSCULAR | Status: AC
  Administered 2019-11-07: 30 mg via INTRAVENOUS
  Filled 2019-11-07: qty 1

## 2019-11-07 MED FILL — ALLOPURINOL 100 MG TABS: 100 | 30 days supply | Qty: 60 | Fill #1

## 2019-11-07 MED FILL — OXYCODONE-ACETAMINOPHEN 5-3: 5-325 | 2 days supply | Qty: 8 | Fill #0

## 2019-11-07 NOTE — ED Provider Notes (Signed)
[facility] DEPT Provider Note   CSN: [id number] Arrival date & time: 11/07/19  [id number]     History Chief Complaint  Patient presents with  . generalized body pain  . chemo patient    Jesse [name] is a 54 y.o. male.  Jesse [name] is a 54 y.o. male with a history of stage IVb Hodgkin's lymphoma, peripheral neuropathy, alcohol abuse, who presents to the emergency department for evaluation of generalized body aches.  He reports symptoms started over the weekend after he received his Neulasta injection on Friday.  He states that he commonly has arthralgias and body aches after receiving this injection but over the past 2 days pain has been a bit worse than usual.  He states that he usually takes ibuprofen and Claritin for this.  States he has been taking Aleve's, but did not have any Claritin to take.  He states that he thinks he got some stronger pain medication from a friend but is not sure what he took, states this also provided minimal relief.  He denies any associated fevers or chills.  No night sweats or weight loss.  No focal chest pain, shortness of breath, abdominal pain, nausea, vomiting or diarrhea.  Earlier last week he received his chemo infusion, reports he usually gets some irritation around his port site after this but otherwise did not have any atypical symptoms.  He is followed by Dr. Earlie Server with oncology, has not called him regarding body aches.        Past Medical History:  Diagnosis Date  . ETOH abuse   . Lymphadenopathy    left  . Marijuana abuse     Patient Active Problem List   Diagnosis Date Noted  . Peripheral neuropathy 10/17/2019  . Port-A-Cath in place 08/08/2019  . Encounter for antineoplastic chemotherapy 05/24/2019  . Cancer associated pain 05/24/2019  . Hodgkin's lymphoma ([place]) 05/10/2019  . Goals of care, counseling/discussion 05/10/2019  . Lymphadenopathy of left cervical region 04/24/2019     Past Surgical History:  Procedure Laterality Date  . HERNIA REPAIR     as child  . IR IMAGING GUIDED PORT INSERTION  05/29/2019  . LYMPH NODE BIOPSY Left 05/07/2019   Procedure: LYMPH NODE BIOPSY;  Surgeon: Melida Quitter, MD;  Location: [facility];  Service: ENT;  Laterality: Left;       Family History  Problem Relation Age of Onset  . Cancer Mother   . Cancer Father   . Cancer Sister     Social History   Tobacco Use  . Smoking status: Former Smoker    Packs/day: 1.00    Years: 20.00    Pack years: 20.00    Quit date: 04/24/1999    Years since quitting: 20.5  . Smokeless tobacco: Never Used  Substance Use Topics  . Alcohol use: Yes    Comment: 12 beers daily  . Drug use: Yes    Types: Marijuana    Comment: daily    Home Medications Prior to Admission medications   Medication Sig Start Date End Date Taking? Authorizing Provider  acetaminophen (TYLENOL) 500 MG tablet Take 1,000 mg by mouth every 6 (six) hours as needed.    [provider]  allopurinol (ZYLOPRIM) 100 MG tablet Take 1 tablet (100 mg total) by mouth 2 (two) times daily. 05/24/19   Heilingoetter, Cassandra L, PA-C  gabapentin (NEURONTIN) 100 MG capsule Take 1 capsule (100 mg total) by mouth 3 (three) times daily. 10/17/19  Heilingoetter, Cassandra L, PA-C  ibuprofen (ADVIL) 800 MG tablet Take 800 mg by mouth every 8 (eight) hours as needed.    [provider]  lidocaine-prilocaine (EMLA) cream Apply 1 application topically as needed. Apply to port site  45 minutes prior to port being accessed 06/12/19   Heilingoetter, Cassandra L, PA-C  oxyCODONE-acetaminophen (PERCOCET) 5-325 MG tablet Take 1 tablet by mouth every 6 (six) hours as needed. 11/07/19   Jacqlyn Larsen, PA-C  prochlorperazine (COMPAZINE) 10 MG tablet Take 1 tablet (10 mg total) by mouth every 8 (eight) hours as needed for nausea or vomiting. 06/12/19   Heilingoetter, Cassandra L, PA-C    Allergies    Patient has  no known allergies.  Review of Systems   Review of Systems  Constitutional: Negative for chills and fever.  HENT: Negative.   Respiratory: Negative for cough and shortness of breath.   Cardiovascular: Negative for chest pain and leg swelling.  Gastrointestinal: Negative for abdominal pain, diarrhea, nausea and vomiting.  Genitourinary: Negative for dysuria and frequency.  Musculoskeletal: Positive for arthralgias and myalgias.  Skin: Negative for color change and rash.  Neurological: Negative for dizziness, syncope and light-headedness.    Physical Exam Updated Vital Signs BP (!) 130/102 (BP Location: Left Arm)   Pulse 72   Temp 97.6 F (36.4 C) (Oral)   Resp 16   SpO2 99%   Physical Exam Vitals and nursing note reviewed.  Constitutional:      General: He is not in acute distress.    Appearance: Normal appearance. He is well-developed. He is not ill-appearing or diaphoretic.     Comments: Well-appearing and in no distress  HENT:     Head: Normocephalic and atraumatic.  Eyes:     General:        Right eye: No discharge.        Left eye: No discharge.  Cardiovascular:     Rate and Rhythm: Normal rate and regular rhythm.     Pulses: Normal pulses.     Heart sounds: Normal heart sounds. No murmur. No friction rub. No gallop.   Pulmonary:     Effort: Pulmonary effort is normal. No respiratory distress.     Breath sounds: Normal breath sounds. No wheezing or rales.     Comments: Respirations equal and unlabored, patient able to speak in full sentences, lungs clear to auscultation bilaterally Abdominal:     General: Bowel sounds are normal. There is no distension.     Palpations: Abdomen is soft. There is no mass.     Tenderness: There is no abdominal tenderness. There is no guarding.     Comments: Abdomen soft, nondistended, nontender to palpation in all quadrants without guarding or peritoneal signs  Musculoskeletal:        General: No deformity.     Cervical back: Neck  supple.     Comments: Patient moves all extremities without difficulty, mild discomfort with movement. All joints supple and easily movable, no erythema, swelling or palpable deformity, all compartments soft.  Skin:    General: Skin is warm and dry.     Capillary Refill: Capillary refill takes less than 2 seconds.  Neurological:     Mental Status: He is alert.     Coordination: Coordination normal.     Comments: Speech is clear, able to follow commands Moves extremities without ataxia, coordination intact  Psychiatric:        Mood and Affect: Mood normal.  Behavior: Behavior normal.     ED Results / Procedures / Treatments   Labs (all labs ordered are listed, but only abnormal results are displayed) Labs Reviewed  CBC WITH DIFFERENTIAL/PLATELET - Abnormal; Notable for the following components:      Result Value   WBC 13.8 (*)    RBC 3.93 (*)    Hemoglobin 12.0 (*)    HCT 37.8 (*)    RDW 17.1 (*)    Neutro Abs 11.9 (*)    Abs Immature Granulocytes 0.26 (*)    All other components within normal limits  BASIC METABOLIC PANEL    EKG EKG Interpretation  Date/Time:  Wednesday November 07 2019 10:47:45 EDT Ventricular Rate:  72 PR Interval:    QRS Duration: 79 QT Interval:  376 QTC Calculation: 412 R Axis:   54 Text Interpretation: Sinus rhythm Minimal ST elevation, anterior leads Confirmed by Gerlene Fee [phone]) on 11/07/2019 11:35:45 AM   Radiology No results found.  Procedures Procedures (including critical care time)  Medications Ordered in ED Medications  sodium chloride 0.9 % bolus 500 mL (0 mLs Intravenous Stopped 11/07/19 1122)  oxyCODONE-acetaminophen (PERCOCET/ROXICET) 5-325 MG per tablet 1 tablet (1 tablet Oral Given 11/07/19 1042)  loratadine (CLARITIN) tablet 10 mg (10 mg Oral Given 11/07/19 1042)  ketorolac (TORADOL) 30 MG/ML injection 30 mg (30 mg Intravenous Given 11/07/19 1121)    ED Course  I have reviewed the triage vital signs and the nursing  notes.  Pertinent labs & imaging results that were available during my care of the patient were reviewed by me and considered in my medical decision making (see chart for details).    MDM Rules/Calculators/A&P                     54 year old male with stage IV Hodgkin's lymphoma presents with generalized body aches, myalgias and arthralgias.  Symptoms began over the weekend after receiving his Neulasta injection, he states he commonly gets similar pain with this, but this is a bit worse than usual.  He usually takes ibuprofen and Claritin but has only been taking Aleve for the symptoms without much relief.  He does not have any focal localized pain, no swollen joints or skin changes.  No fevers or chills.  I suspect this pain is likely from his Neulasta injection, he also has bony metastasis in the pelvis that can be painful on its own.  Will check basic labs, EKG, will give some IV fluids, and pain medication.  Patient will be also given dose of Claritin as this is usually used to help manage this discomfort.  Lab work is overall reassuring, no electrolyte derangements and normal renal function, expected leukocytosis after receiving Neulasta injection, appropriate hemoglobin.  We will also give dose of IV Toradol.  On reevaluation patient reports his pain is overall improved and he is feeling better.  Feel that patient can be discharged home will prescribe short course of pain medication but I have asked the patient follow-up closely with his oncologist and call him to discuss this pain.  He has normal vitals, has not been febrile, no concern for infection, he has no other symptoms to suggest viral etiology.  Patient expresses understanding and agreement with plan.   Final Clinical Impression(s) / ED Diagnoses Final diagnoses:  Body aches    Rx / DC Orders ED Discharge Orders         Ordered    oxyCODONE-acetaminophen (PERCOCET) 5-325 MG tablet  Every 6  hours PRN     11/07/19 1143            Jacqlyn Larsen, PA-C 11/07/19 1144    Maudie Flakes, MD 11/10/19 [phone]

## 2019-11-07 NOTE — Telephone Encounter (Signed)
Wife left voice mail  "I am taking Wolfram to the ED for severe leg pain".  Received Neulasta on 3/19.

## 2019-11-07 NOTE — ED Triage Notes (Signed)
Patient c/o generalized body pain since yesterday. patient states the pain is much worse today. Patient's last chemo treatment was last week.

## 2019-11-07 NOTE — Discharge Instructions (Addendum)
Please continue taking ibuprofen and Claritin, you can use prescribed Percocet for breakthrough pain.  Please call to schedule follow-up with your oncologist and let him know about the pain you have been having today.  Your lab work looks good.  Return if you develop fevers, worsening pain or any other new or concerning symptoms.

## 2019-11-16 ENCOUNTER — Other Ambulatory Visit: Payer: Self-pay

## 2019-11-16 ENCOUNTER — Encounter (HOSPITAL_COMMUNITY)
Admission: RE | Admit: 2019-11-16 | Discharge: 2019-11-16 | Disposition: A | Discharge status: Home / Self Care | Payer: Medicaid Other | Source: Ambulatory Visit | Attending: Physician Assistant | Admitting: Physician Assistant

## 2019-11-16 DIAGNOSIS — C8118 Nodular sclerosis classical Hodgkin lymphoma, lymph nodes of multiple sites: Secondary | ICD-10-CM | POA: Insufficient documentation

## 2019-11-16 LAB — GLUCOSE, CAPILLARY: Glucose-Capillary: 83 mg/dL (ref 70–99)

## 2019-11-16 IMAGING — CT NM PET TUM IMG RESTAG (PS) SKULL BASE T - THIGH
1 of 7 series · 1 of 25 positions shown · non-contrast
Comparison: [DATE] and [DATE]

CLINICAL DATA: Subsequent treatment strategy for Hodgkin's
lymphoma.

EXAM:
NUCLEAR MEDICINE PET SKULL BASE TO THIGH
TECHNIQUE: 6.97 mCi F-18 FDG was injected intravenously. Full-ring PET imaging
was performed from the skull base to thigh after the radiotracer. CT
data was obtained and used for attenuation correction and anatomic
localization.
Fasting blood glucose: 83 mg/dl

[Series 4: ct sk_thigh 5.0 b31f · axial · 5.0mm · 0.98mm/px · 1 of 233 slices shown]
[im 233/233  brain]
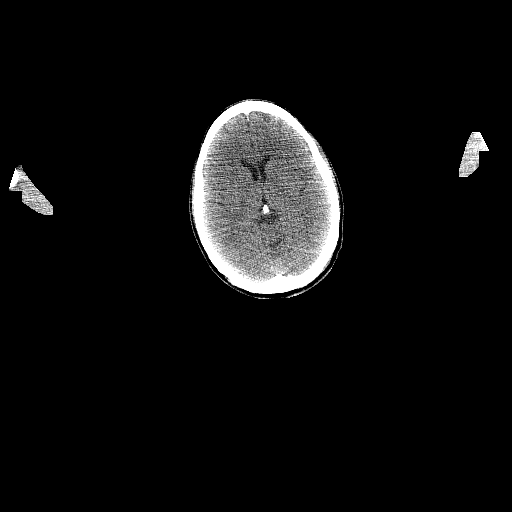

[1 of 25 positions shown; findings below may reference images not displayed]

FINDINGS: Mediastinal blood pool activity: SUV max

Liver activity: SUV max

NECK: No residual hypermetabolic lymphadenopathy. Small scattered
sub 8 mm lymph nodes are noted. There is significant artifact
involving the head and neck due to patient movement during the
examination.

Incidental CT findings: Age advanced carotid artery calcifications
are noted.

CHEST: No enlarged or hypermetabolic supraclavicular or axillary
lymph nodes. A few small scattered lymph nodes are unchanged.

No mediastinal or hilar mass or adenopathy is identified.

No significant pulmonary findings.

Incidental CT findings: The Port-A-Cath is stable. Stable age
advanced coronary artery calcifications.

ABDOMEN/PELVIS: No residual enlarged or hypermetabolic mesenteric or
retroperitoneal lymph nodes. The spleen is normal in size. No
splenic lesions or splenic hypermetabolism.

Incidental CT findings: Age advanced vascular calcifications. Stable
hepatic hemangioma.

SKELETON: Resolution of diffuse osseous hypermetabolism seen on the
prior study. I do not see any evidence of residual osseous disease.

Incidental CT findings: none
IMPRESSION: PET-CT findings consistent with a complete metabolic response. No
enlarged or hypermetabolic adenopathy in the neck, chest,
abdomen/pelvis or osseous structures. [HOSPITAL] 1.

## 2019-11-16 MED ORDER — FLUDEOXYGLUCOSE F - 18 (FDG) INJECTION
6.9700 | Freq: Once | INTRAVENOUS | Status: AC | PRN
  Administered 2019-11-16: 6.97 via INTRAVENOUS

## 2019-11-19 ENCOUNTER — Other Ambulatory Visit: Payer: Self-pay | Admitting: Medical Oncology

## 2019-11-19 DIAGNOSIS — C8118 Nodular sclerosis classical Hodgkin lymphoma, lymph nodes of multiple sites: Secondary | ICD-10-CM

## 2019-11-20 ENCOUNTER — Inpatient Hospital Stay: Payer: Medicaid Other

## 2019-11-20 ENCOUNTER — Other Ambulatory Visit: Payer: Self-pay

## 2019-11-20 ENCOUNTER — Encounter: Payer: Self-pay | Admitting: Internal Medicine

## 2019-11-20 ENCOUNTER — Inpatient Hospital Stay: Payer: Medicaid Other | Attending: Internal Medicine | Admitting: Internal Medicine

## 2019-11-20 VITALS — BP 111/76 | HR 77 | Temp 98.9°F | Resp 17 | Ht 66.0 in | Wt 139.6 lb

## 2019-11-20 DIAGNOSIS — D1803 Hemangioma of intra-abdominal structures: Secondary | ICD-10-CM | POA: Insufficient documentation

## 2019-11-20 DIAGNOSIS — Z5111 Encounter for antineoplastic chemotherapy: Secondary | ICD-10-CM

## 2019-11-20 DIAGNOSIS — C8118 Nodular sclerosis classical Hodgkin lymphoma, lymph nodes of multiple sites: Secondary | ICD-10-CM

## 2019-11-20 DIAGNOSIS — M2550 Pain in unspecified joint: Secondary | ICD-10-CM | POA: Diagnosis not present

## 2019-11-20 DIAGNOSIS — Z79899 Other long term (current) drug therapy: Secondary | ICD-10-CM | POA: Diagnosis not present

## 2019-11-20 DIAGNOSIS — R531 Weakness: Secondary | ICD-10-CM | POA: Diagnosis not present

## 2019-11-20 DIAGNOSIS — R5383 Other fatigue: Secondary | ICD-10-CM | POA: Diagnosis not present

## 2019-11-20 DIAGNOSIS — Z7189 Other specified counseling: Secondary | ICD-10-CM

## 2019-11-20 LAB — CMP (CANCER CENTER ONLY)
ALT: 16 U/L (ref 0–44)
AST: 19 U/L (ref 15–41)
Albumin: 3.8 g/dL (ref 3.5–5.0)
Alkaline Phosphatase: 107 U/L (ref 38–126)
Anion gap: 8 (ref 5–15)
BUN: 15 mg/dL (ref 6–20)
CO2: 25 mmol/L (ref 22–32)
Calcium: 9.3 mg/dL (ref 8.9–10.3)
Chloride: 105 mmol/L (ref 98–111)
Creatinine: 0.81 mg/dL (ref 0.61–1.24)
GFR, Est AFR Am: >60 mL/min (ref >60)
GFR, Estimated: >60 mL/min (ref >60)
Glucose, Bld: 79 mg/dL (ref 70–99)
Potassium: 4 mmol/L (ref 3.5–5.1)
Sodium: 138 mmol/L (ref 135–145)
Total Bilirubin: 0.3 mg/dL (ref 0.3–1.2)
Total Protein: 7.3 g/dL (ref 6.5–8.1)

## 2019-11-20 LAB — CBC WITH DIFFERENTIAL (CANCER CENTER ONLY)
Abs Immature Granulocytes: 0.02 10*3/uL (ref 0.00–0.07)
Basophils Absolute: 0 10*3/uL (ref 0.0–0.1)
Basophils Relative: 0 %
Eosinophils Absolute: 0 10*3/uL (ref 0.0–0.5)
Eosinophils Relative: 1 %
HCT: 36.7 % — ABNORMAL LOW (ref 39.0–52.0)
Hemoglobin: 11.7 g/dL — ABNORMAL LOW (ref 13.0–17.0)
Immature Granulocytes: 1 %
Lymphocytes Relative: 27 %
Lymphs Abs: 1.1 10*3/uL (ref 0.7–4.0)
MCH: 29.6 pg (ref 26.0–34.0)
MCHC: 31.9 g/dL (ref 30.0–36.0)
MCV: 92.9 fL (ref 80.0–100.0)
Monocytes Absolute: 0.7 10*3/uL (ref 0.1–1.0)
Monocytes Relative: 16 %
Neutro Abs: 2.3 10*3/uL (ref 1.7–7.7)
Neutrophils Relative %: 55 %
Platelet Count: 319 10*3/uL (ref 150–400)
RBC: 3.95 MIL/uL — ABNORMAL LOW (ref 4.22–5.81)
RDW: 17.3 % — ABNORMAL HIGH (ref 11.5–15.5)
WBC Count: 4.2 10*3/uL (ref 4.0–10.5)
nRBC: 0 % (ref 0.0–0.2)

## 2019-11-20 LAB — LACTATE DEHYDROGENASE: LDH: 169 U/L (ref 98–192)

## 2019-11-20 NOTE — Progress Notes (Signed)
[facility] Telephone:([phone]) [phone]   [phone]  OFFICE PROGRESS NOTE  Patient, No Pcp Per No address on file  DIAGNOSIS: Stage IVB Hodgkin's Lymphoma, nodular sclerosis subtype. He presented with a bulky left cervical and supraclavicular lymphadenopathy as well as involvement of the spleen, nodal disease in the porta hepatis, and extensive disease involving the marrow of the pelvis, spine, and ribs. He was diagnosed in September 2020.   IPS score: 4   PRIOR THERAPY: Systemic chemotherapy with brentuximab vedotin 1.2 mg/kg, doxorubicin 25 mg/m2, vinblastine 6 mg/m2, dacarbazine 375 mg/m2 on days 1 and 15 every 28 days. First dose expected on 05/30/2019.   Status post 6 cycles.    CURRENT THERAPY:  Observation.  INTERVAL HISTORY: Jesse [name] 54 y.o. male returns to the clinic today for follow-up visit.  The patient is feeling fine today with no concerning complaints except for some weakness in the lower extremities.  He also has some arthralgia and was seen at the emergency department and giving some pain medication.  He denied having any palpable lymphadenopathy.  He denied having any chest pain, shortness of breath, cough or hemoptysis.  He denied having any fever or chills.  He has no nausea, vomiting, diarrhea or constipation.  He has no significant weight loss or night sweats.  The patient completed 6 cycles of systemic chemotherapy.  He had repeat PET scan performed few days ago and he is here for evaluation and discussion of his scan results and recommendation regarding treatment of his condition.  MEDICAL HISTORY: Past Medical History:  Diagnosis Date  . ETOH abuse   . Lymphadenopathy    left  . Marijuana abuse     ALLERGIES:  has No Known Allergies.  MEDICATIONS:  Current Outpatient Medications  Medication Sig Dispense Refill  . acetaminophen (TYLENOL) 500 MG tablet Take 1,000 mg by mouth every 6 (six) hours as needed.    Marland Kitchen  allopurinol (ZYLOPRIM) 100 MG tablet Take 1 tablet (100 mg total) by mouth 2 (two) times daily. 60 tablet 2  . gabapentin (NEURONTIN) 100 MG capsule Take 1 capsule (100 mg total) by mouth 3 (three) times daily. 90 capsule 2  . ibuprofen (ADVIL) 800 MG tablet Take 800 mg by mouth every 8 (eight) hours as needed.    . lidocaine-prilocaine (EMLA) cream Apply 1 application topically as needed. Apply to port site  45 minutes prior to port being accessed 30 g 1  . oxyCODONE-acetaminophen (PERCOCET) 5-325 MG tablet Take 1 tablet by mouth every 6 (six) hours as needed. 8 tablet 0  . prochlorperazine (COMPAZINE) 10 MG tablet Take 1 tablet (10 mg total) by mouth every 8 (eight) hours as needed for nausea or vomiting. 30 tablet 2   No current facility-administered medications for this visit.    SURGICAL HISTORY:  Past Surgical History:  Procedure Laterality Date  . HERNIA REPAIR     as child  . IR IMAGING GUIDED PORT INSERTION  05/29/2019  . LYMPH NODE BIOPSY Left 05/07/2019   Procedure: LYMPH NODE BIOPSY;  Surgeon: Melida Quitter, MD;  Location: [facility];  Service: ENT;  Laterality: Left;    REVIEW OF SYSTEMS:  Constitutional: positive for fatigue Eyes: negative Ears, nose, mouth, throat, and face: negative Respiratory: negative Cardiovascular: negative Gastrointestinal: negative Genitourinary:negative Integument/breast: negative Hematologic/lymphatic: negative Musculoskeletal:positive for arthralgias Neurological: negative Behavioral/Psych: negative Endocrine: negative Allergic/Immunologic: negative   PHYSICAL EXAMINATION: General appearance: alert, cooperative, fatigued and no distress Head: Normocephalic,  without obvious abnormality, atraumatic Neck: no adenopathy, no JVD, supple, symmetrical, trachea midline and thyroid not enlarged, symmetric, no tenderness/mass/nodules Lymph nodes: Cervical, supraclavicular, and axillary nodes normal. Resp: clear to auscultation  bilaterally Back: symmetric, no curvature. ROM normal. No CVA tenderness. Cardio: regular rate and rhythm, S1, S2 normal, no murmur, click, rub or gallop GI: soft, non-tender; bowel sounds normal; no masses,  no organomegaly Extremities: extremities normal, atraumatic, no cyanosis or edema Neurologic: Alert and oriented X 3, normal strength and tone. Normal symmetric reflexes. Normal coordination and gait  ECOG PERFORMANCE STATUS: 1 - Symptomatic but completely ambulatory  Blood pressure 111/76, pulse 77, temperature 98.9 F (37.2 C), temperature source Temporal, resp. rate 17, height 5\' 6"  (1.676 m), weight 139 lb 9.6 oz (63.3 kg), SpO2 100 %.  LABORATORY DATA: Lab Results  Component Value Date   WBC 13.8 (H) 11/07/2019   HGB 12.0 (L) 11/07/2019   HCT 37.8 (L) 11/07/2019   MCV 96.2 11/07/2019   PLT 215 11/07/2019      Chemistry      Component Value Date/Time   NA 137 11/07/2019 1017   K 3.5 11/07/2019 1017   CL 103 11/07/2019 1017   CO2 25 11/07/2019 1017   BUN 17 11/07/2019 1017   CREATININE 0.68 11/07/2019 1017   CREATININE 0.71 10/31/2019 1030      Component Value Date/Time   CALCIUM 9.1 11/07/2019 1017   ALKPHOS 141 (H) 10/31/2019 1030   AST 19 10/31/2019 1030   ALT 18 10/31/2019 1030   BILITOT 0.3 10/31/2019 1030       RADIOGRAPHIC STUDIES: NM PET Image Restag (PS) Skull Base To Thigh  Result Date: 11/16/2019 CLINICAL DATA:  Subsequent treatment strategy for Hodgkin's lymphoma. EXAM: NUCLEAR MEDICINE PET SKULL BASE TO THIGH TECHNIQUE: 6.97 mCi F-18 FDG was injected intravenously. Full-ring PET imaging was performed from the skull base to thigh after the radiotracer. CT data was obtained and used for attenuation correction and anatomic localization. Fasting blood glucose: 83 mg/dl COMPARISON:  08/13/2019 and 05/21/2019 FINDINGS: Mediastinal blood pool activity: SUV max 1.74 Liver activity: SUV max 2.20 NECK: No residual hypermetabolic lymphadenopathy. Small scattered  sub 8 mm lymph nodes are noted. There is significant artifact involving the head and neck due to patient movement during the examination. Incidental CT findings: Age advanced carotid artery calcifications are noted. CHEST: No enlarged or hypermetabolic supraclavicular or axillary lymph nodes. A few small scattered lymph nodes are unchanged. No mediastinal or hilar mass or adenopathy is identified. No significant pulmonary findings. Incidental CT findings: The Port-A-Cath is stable. Stable age advanced coronary artery calcifications. ABDOMEN/PELVIS: No residual enlarged or hypermetabolic mesenteric or retroperitoneal lymph nodes. The spleen is normal in size. No splenic lesions or splenic hypermetabolism. Incidental CT findings: Age advanced vascular calcifications. Stable hepatic hemangioma. SKELETON: Resolution of diffuse osseous hypermetabolism seen on the prior study. I do not see any evidence of residual osseous disease. Incidental CT findings: none IMPRESSION: PET-CT findings consistent with a complete metabolic response. No enlarged or hypermetabolic adenopathy in the neck, chest, abdomen/pelvis or osseous structures. Deauville 1. Electronically Signed   By: Marijo Sanes M.D.   On: 11/16/2019 17:11    ASSESSMENT AND PLAN: This is a very pleasant 55 years old African-American male recently diagnosed with a stage IV non-Hodgkin lymphoma, nodular sclerosing subtype.  The patient is currently undergoing systemic chemotherapy with brentuximab Vedotin in addition to doxorubicin, vinblastine and dacarbazine.  Status post 6 cycles.  The patient tolerated his treatment fairly  well with no concerning adverse effects except for fatigue.  He had repeat PET scan performed recently.  I personally and independently reviewed the PET scan images and discussed the results with the patient today. His PET scan showed almost complete resolution of his disease with no hypermetabolic activity in the neck, chest, abdomen pelvis  or the osseous structures.  He has Deauville1. I recommended for the patient to continue on observation.  I will see him back for follow-up visit in 3 months with repeat blood work. He was advised to call immediately if he has any other concerning symptoms in the interval. The patient voices understanding of current disease status and treatment options and is in agreement with the current care plan. All questions were answered. The patient knows to call the clinic with any problems, questions or concerns. We can certainly see the patient much sooner if necessary.  Disclaimer: This note was dictated with voice recognition software. Similar sounding words can inadvertently be transcribed and may not be corrected upon review.

## 2019-12-04 ENCOUNTER — Telehealth: Payer: Self-pay | Admitting: Medical Oncology

## 2019-12-04 NOTE — Telephone Encounter (Signed)
Called Jesse [name] and told her his first treatment was 05/31/2019 and 24 treatments.

## 2019-12-15 MED FILL — GABAPENTIN 100 MG CAPSULE: 100 | 30 days supply | Qty: 90 | Fill #1

## 2020-01-28 MED FILL — GABAPENTIN 100 MG CAPSULE: 100 | 30 days supply | Qty: 90 | Fill #2

## 2020-02-21 ENCOUNTER — Other Ambulatory Visit: Payer: Self-pay

## 2020-02-21 ENCOUNTER — Inpatient Hospital Stay: Payer: Medicaid Other

## 2020-02-21 ENCOUNTER — Encounter: Payer: Self-pay | Admitting: Internal Medicine

## 2020-02-21 ENCOUNTER — Inpatient Hospital Stay: Payer: Medicaid Other | Attending: Internal Medicine | Admitting: Internal Medicine

## 2020-02-21 VITALS — BP 135/99 | HR 69 | Temp 97.5°F | Resp 18 | Ht 66.0 in | Wt 134.9 lb

## 2020-02-21 DIAGNOSIS — D72819 Decreased white blood cell count, unspecified: Secondary | ICD-10-CM | POA: Diagnosis not present

## 2020-02-21 DIAGNOSIS — C8118 Nodular sclerosis classical Hodgkin lymphoma, lymph nodes of multiple sites: Secondary | ICD-10-CM | POA: Insufficient documentation

## 2020-02-21 DIAGNOSIS — C8110 Nodular sclerosis classical Hodgkin lymphoma, unspecified site: Secondary | ICD-10-CM

## 2020-02-21 DIAGNOSIS — M6281 Muscle weakness (generalized): Secondary | ICD-10-CM | POA: Diagnosis not present

## 2020-02-21 DIAGNOSIS — R5383 Other fatigue: Secondary | ICD-10-CM | POA: Insufficient documentation

## 2020-02-21 DIAGNOSIS — Z79899 Other long term (current) drug therapy: Secondary | ICD-10-CM | POA: Insufficient documentation

## 2020-02-21 LAB — CBC WITH DIFFERENTIAL (CANCER CENTER ONLY)
Abs Immature Granulocytes: 0 10*3/uL (ref 0.00–0.07)
Basophils Absolute: 0 10*3/uL (ref 0.0–0.1)
Basophils Relative: 1 %
Eosinophils Absolute: 0 10*3/uL (ref 0.0–0.5)
Eosinophils Relative: 1 %
HCT: 41.6 % (ref 39.0–52.0)
Hemoglobin: 13.8 g/dL (ref 13.0–17.0)
Immature Granulocytes: 0 %
Lymphocytes Relative: 32 %
Lymphs Abs: 0.9 10*3/uL (ref 0.7–4.0)
MCH: 30.1 pg (ref 26.0–34.0)
MCHC: 33.2 g/dL (ref 30.0–36.0)
MCV: 90.8 fL (ref 80.0–100.0)
Monocytes Absolute: 0.3 10*3/uL (ref 0.1–1.0)
Monocytes Relative: 11 %
Neutro Abs: 1.6 10*3/uL — ABNORMAL LOW (ref 1.7–7.7)
Neutrophils Relative %: 55 %
Platelet Count: 238 10*3/uL (ref 150–400)
RBC: 4.58 MIL/uL (ref 4.22–5.81)
RDW: 13.4 % (ref 11.5–15.5)
WBC Count: 2.9 10*3/uL — ABNORMAL LOW (ref 4.0–10.5)
nRBC: 0 % (ref 0.0–0.2)

## 2020-02-21 LAB — CMP (CANCER CENTER ONLY)
ALT: 28 U/L (ref 0–44)
AST: 29 U/L (ref 15–41)
Albumin: 4 g/dL (ref 3.5–5.0)
Alkaline Phosphatase: 95 U/L (ref 38–126)
Anion gap: 8 (ref 5–15)
BUN: 16 mg/dL (ref 6–20)
CO2: 27 mmol/L (ref 22–32)
Calcium: 9.1 mg/dL (ref 8.9–10.3)
Chloride: 104 mmol/L (ref 98–111)
Creatinine: 0.86 mg/dL (ref 0.61–1.24)
GFR, Est AFR Am: >60 mL/min (ref >60)
GFR, Estimated: >60 mL/min (ref >60)
Glucose, Bld: 95 mg/dL (ref 70–99)
Potassium: 4 mmol/L (ref 3.5–5.1)
Sodium: 139 mmol/L (ref 135–145)
Total Bilirubin: 0.5 mg/dL (ref 0.3–1.2)
Total Protein: 7.4 g/dL (ref 6.5–8.1)

## 2020-02-21 LAB — LACTATE DEHYDROGENASE: LDH: 215 U/L — ABNORMAL HIGH (ref 98–192)

## 2020-02-21 NOTE — Progress Notes (Signed)
[facility] Telephone:([phone]   Fax:(336) [phone]  OFFICE PROGRESS NOTE  Patient, No Pcp Per No address on file  DIAGNOSIS: Stage IVB Hodgkin's Lymphoma, nodular sclerosis subtype. He presented with a bulky left cervical and supraclavicular lymphadenopathy as well as involvement of the spleen, nodal disease in the porta hepatis, and extensive disease involving the marrow of the pelvis, spine, and ribs. He was diagnosed in September 2020.   IPS score: 4   PRIOR THERAPY: Systemic chemotherapy with brentuximab vedotin 1.2 mg/kg, doxorubicin 25 mg/m2, vinblastine 6 mg/m2, dacarbazine 375 mg/m2 on days 1 and 15 every 28 days. First dose expected on 05/30/2019.   Status post 6 cycles.    CURRENT THERAPY:  Observation.  INTERVAL HISTORY: Jesse [name] 54 y.o. male returns to the clinic today for 3 months follow-up visit.  The patient is feeling fine today with no concerning complaints.  He denied having any chest pain, shortness of breath, cough or hemoptysis.  He denied having any fever or chills.  He has no nausea, vomiting, diarrhea or constipation.  He has some weakness on the right hand.  The patient is here today for evaluation and repeat blood work.  MEDICAL HISTORY: Past Medical History:  Diagnosis Date  . ETOH abuse   . Lymphadenopathy    left  . Marijuana abuse     ALLERGIES:  has No Known Allergies.  MEDICATIONS:  Current Outpatient Medications  Medication Sig Dispense Refill  . acetaminophen (TYLENOL) 500 MG tablet Take 1,000 mg by mouth every 6 (six) hours as needed.    . gabapentin (NEURONTIN) 100 MG capsule Take 1 capsule (100 mg total) by mouth 3 (three) times daily. 90 capsule 2  . lidocaine-prilocaine (EMLA) cream Apply 1 application topically as needed. Apply to port site  45 minutes prior to port being accessed 30 g 1  . ibuprofen (ADVIL) 800 MG tablet Take 800 mg by mouth every 8 (eight) hours as needed. (Patient not taking:  Reported on 02/21/2020)    . prochlorperazine (COMPAZINE) 10 MG tablet Take 1 tablet (10 mg total) by mouth every 8 (eight) hours as needed for nausea or vomiting. (Patient not taking: Reported on 02/21/2020) 30 tablet 2   No current facility-administered medications for this visit.    SURGICAL HISTORY:  Past Surgical History:  Procedure Laterality Date  . HERNIA REPAIR     as child  . IR IMAGING GUIDED PORT INSERTION  05/29/2019  . LYMPH NODE BIOPSY Left 05/07/2019   Procedure: LYMPH NODE BIOPSY;  Surgeon: Melida Quitter, MD;  Location: [facility];  Service: ENT;  Laterality: Left;    REVIEW OF SYSTEMS:  A comprehensive review of systems was negative except for: Musculoskeletal: positive for muscle weakness   PHYSICAL EXAMINATION: General appearance: alert, cooperative, fatigued and no distress Head: Normocephalic, without obvious abnormality, atraumatic Neck: no adenopathy, no JVD, supple, symmetrical, trachea midline and thyroid not enlarged, symmetric, no tenderness/mass/nodules Lymph nodes: Cervical, supraclavicular, and axillary nodes normal. Resp: clear to auscultation bilaterally Back: symmetric, no curvature. ROM normal. No CVA tenderness. Cardio: regular rate and rhythm, S1, S2 normal, no murmur, click, rub or gallop GI: soft, non-tender; bowel sounds normal; no masses,  no organomegaly Extremities: extremities normal, atraumatic, no cyanosis or edema  ECOG PERFORMANCE STATUS: 1 - Symptomatic but completely ambulatory  Blood pressure (!) 135/99, pulse 69, temperature (!) 97.5 F (36.4 C), temperature source Temporal, resp. rate 18, height 5\' 6"  (1.676 m), weight  134 lb 14.4 oz (61.2 kg), SpO2 100 %.  LABORATORY DATA: Lab Results  Component Value Date   WBC 2.9 (L) 02/21/2020   HGB 13.8 02/21/2020   HCT 41.6 02/21/2020   MCV 90.8 02/21/2020   PLT 238 02/21/2020      Chemistry      Component Value Date/Time   NA 138 11/20/2019 1207   K 4.0 11/20/2019  1207   CL 105 11/20/2019 1207   CO2 25 11/20/2019 1207   BUN 15 11/20/2019 1207   CREATININE 0.81 11/20/2019 1207      Component Value Date/Time   CALCIUM 9.3 11/20/2019 1207   ALKPHOS 107 11/20/2019 1207   AST 19 11/20/2019 1207   ALT 16 11/20/2019 1207   BILITOT 0.3 11/20/2019 1207       RADIOGRAPHIC STUDIES: No results found.  ASSESSMENT AND PLAN: This is a very pleasant 54 years old African-American male recently diagnosed with a stage IV non-Hodgkin lymphoma, nodular sclerosing subtype.  The patient is currently undergoing systemic chemotherapy with brentuximab Vedotin in addition to doxorubicin, vinblastine and dacarbazine.  Status post 6 cycles.  The patient tolerated his treatment fairly well with no concerning adverse effects except for fatigue.  He had repeat PET scan performed recently.  I personally and independently reviewed the PET scan images and discussed the results with the patient today. His last PET scan after the treatment showed almost complete resolution of his disease with no hypermetabolic activity in the neck, chest, abdomen pelvis or the osseous structures.  He has Deauville1. The patient is currently on observation and he is feeling fine with no concerning complaints.  His blood work is unremarkable except for mild leukocytopenia. I recommended for him to continue on observation with repeat CT scan of the chest, abdomen pelvis in 3 months. He will have a Port-A-Cath flush every 6 weeks. The patient was advised to call immediately if he has any concerning symptoms in the interval. The patient voices understanding of current disease status and treatment options and is in agreement with the current care plan. All questions were answered. The patient knows to call the clinic with any problems, questions or concerns. We can certainly see the patient much sooner if necessary.  Disclaimer: This note was dictated with voice recognition software. Similar sounding words  can inadvertently be transcribed and may not be corrected upon review.

## 2020-02-28 ENCOUNTER — Encounter: Payer: Self-pay | Admitting: Pharmacy Technician

## 2020-02-28 NOTE — Progress Notes (Signed)
Patient no longer getting Adcetris/SeaGen and Udenyca/Coherus based on treatment complete. Last DOS covered is 11/02/19.

## 2020-03-18 ENCOUNTER — Other Ambulatory Visit: Payer: Self-pay | Admitting: Physician Assistant

## 2020-03-18 DIAGNOSIS — C8118 Nodular sclerosis classical Hodgkin lymphoma, lymph nodes of multiple sites: Secondary | ICD-10-CM

## 2020-03-18 MED FILL — GABAPENTIN 100 MG CAP: 100 | 30 days supply | Qty: 90 | Fill #0

## 2020-04-03 ENCOUNTER — Other Ambulatory Visit: Payer: Self-pay

## 2020-04-03 ENCOUNTER — Inpatient Hospital Stay: Payer: Medicaid Other | Attending: Internal Medicine

## 2020-04-03 DIAGNOSIS — Z95828 Presence of other vascular implants and grafts: Secondary | ICD-10-CM

## 2020-04-03 DIAGNOSIS — C8118 Nodular sclerosis classical Hodgkin lymphoma, lymph nodes of multiple sites: Secondary | ICD-10-CM | POA: Insufficient documentation

## 2020-04-03 DIAGNOSIS — Z452 Encounter for adjustment and management of vascular access device: Secondary | ICD-10-CM | POA: Insufficient documentation

## 2020-04-03 MED ORDER — HEPARIN SOD (PORK) LOCK FLUSH 100 UNIT/ML IV SOLN
500.0000 [IU] | Freq: Once | INTRAVENOUS | Status: AC | PRN
  Administered 2020-04-03: 500 [IU]
  Filled 2020-04-03: qty 5

## 2020-04-03 MED ORDER — SODIUM CHLORIDE 0.9% FLUSH
10.0000 mL | INTRAVENOUS | Status: DC | PRN
  Administered 2020-04-03: 10 mL
  Filled 2020-04-03: qty 10

## 2020-04-03 NOTE — Patient Instructions (Signed)
\[id number] NLZJ Home Guide An implanted port is a device that is placed under the skin. It is usually placed in the chest. The device can be used to give IV medicine, to take blood, or for dialysis. You may have an implanted port if:  You need IV medicine that would be irritating to the small veins in your hands or arms.  You need IV medicines, such as antibiotics, for a long period of time.  You need IV nutrition for a long period of time.  You need dialysis. Having a port means that your health care provider will not need to use the veins in your arms for these procedures. You may have fewer limitations when using a port than you would if you used other types of long-term IVs, and you will likely be able to return to normal activities after your incision heals. An implanted port has two main parts:  Reservoir. The reservoir is the part where a needle is inserted to give medicines or draw blood. The reservoir is round. After it is placed, it appears as a small, raised area under your skin.  Catheter. The catheter is a thin, flexible tube that connects the reservoir to a vein. Medicine that is inserted into the reservoir goes into the catheter and then into the vein. How is my port accessed? To access your port:  A numbing cream may be placed on the skin over the port site.  Your health care provider will put on a mask and sterile gloves.  The skin over your port will be cleaned carefully with a germ-killing soap and allowed to dry.  Your health care provider will gently pinch the port and insert a needle into it.  Your health care provider will check for a blood return to make sure the port is in the vein and is not clogged.  If your port needs to remain accessed to get medicine continuously (constant infusion), your health care provider will place a clear bandage (dressing) over the needle site. The dressing and needle will need to be changed every week,  or as told by your health care provider. What is flushing? Flushing helps keep the port from getting clogged. Follow instructions from your health care provider about how and when to flush the port. Ports are usually flushed with saline solution or a medicine called heparin. The need for flushing will depend on how the port is used:  If the port is only used from time to time to give medicines or draw blood, the port may need to be flushed: ? Before and after medicines have been given. ? Before and after blood has been drawn. ? As part of routine maintenance. Flushing may be recommended every 4-6 weeks.  If a constant infusion is running, the port may not need to be flushed.  Throw away any syringes in a disposal container that is meant for sharp items (sharps container). You can buy a sharps container from a pharmacy, or you can make one by using an empty hard plastic bottle with a cover. How long will my port stay implanted? The port can stay in for as long as your health care provider thinks it is needed. When it is time for the port to come out, a surgery will be done to remove it. The surgery will be similar to the procedure that was done to put the port in. Follow these instructions at home:   Flush your port as told by your health care provider.  If you need an infusion over several days, follow instructions from your health care provider about how to take care of your port site. Make sure you: ? Wash your hands with soap and water before you change your dressing. If soap and water are not available, use alcohol-based hand sanitizer. ? Change your dressing as told by your health care provider. ? Place any used dressings or infusion bags into a plastic bag. Throw that bag in the trash. ? Keep the dressing that covers the needle clean and dry. Do not get it wet. ? Do not use scissors or sharp objects near the tube. ? Keep the tube clamped, unless it is being used.  Check your port  site every day for signs of infection. Check for: ? Redness, swelling, or pain. ? Fluid or blood. ? Pus or a bad smell.  Protect the skin around the port site. ? Avoid wearing bra straps that rub or irritate the site. ? Protect the skin around your port from seat belts. Place a soft pad over your chest if needed.  Bathe or shower as told by your health care provider. The site may get wet as long as you are not actively receiving an infusion.  Return to your normal activities as told by your health care provider. Ask your health care provider what activities are safe for you.  Carry a medical alert card or wear a medical alert bracelet at all times. This will let health care providers know that you have an implanted port in case of an emergency. Get help right away if:  You have redness, swelling, or pain at the port site.  You have fluid or blood coming from your port site.  You have pus or a bad smell coming from the port site.  You have a fever. Summary  Implanted ports are usually placed in the chest for long-term IV access.  Follow instructions from your health care provider about flushing the port and changing bandages (dressings).  Take care of the area around your port by avoiding clothing that puts pressure on the area, and by watching for signs of infection.  Protect the skin around your port from seat belts. Place a soft pad over your chest if needed.  Get help right away if you have a fever or you have redness, swelling, pain, drainage, or a bad smell at the port site. This information is not intended to replace advice given to you by your health care provider. Make sure you discuss any questions you have with your health care provider. Document Revised: 11/24/2018 Document Reviewed: 09/04/2016 Elsevier Patient Education  2020 [employer].

## 2020-04-22 NOTE — Telephone Encounter (Signed)
Error. Biannca Scantlin M Shaquana Buel, RN  

## 2020-05-15 ENCOUNTER — Inpatient Hospital Stay: Payer: Medicaid Other | Attending: Internal Medicine

## 2020-05-23 ENCOUNTER — Inpatient Hospital Stay: Payer: Medicaid Other

## 2020-05-23 ENCOUNTER — Ambulatory Visit (HOSPITAL_COMMUNITY): Payer: Medicaid Other

## 2020-05-26 ENCOUNTER — Inpatient Hospital Stay: Payer: Medicaid Other | Admitting: Internal Medicine

## 2020-06-03 ENCOUNTER — Other Ambulatory Visit: Payer: Self-pay

## 2020-06-03 ENCOUNTER — Encounter (HOSPITAL_COMMUNITY): Payer: Self-pay

## 2020-06-03 ENCOUNTER — Ambulatory Visit (HOSPITAL_COMMUNITY)
Admission: RE | Admit: 2020-06-03 | Discharge: 2020-06-03 | Disposition: A | Discharge status: Home / Self Care | Payer: Medicaid Other | Source: Ambulatory Visit | Attending: Internal Medicine | Admitting: Internal Medicine

## 2020-06-03 DIAGNOSIS — C8110 Nodular sclerosis classical Hodgkin lymphoma, unspecified site: Secondary | ICD-10-CM | POA: Diagnosis not present

## 2020-06-03 DIAGNOSIS — D1809 Hemangioma of other sites: Secondary | ICD-10-CM | POA: Diagnosis not present

## 2020-06-03 DIAGNOSIS — R918 Other nonspecific abnormal finding of lung field: Secondary | ICD-10-CM | POA: Diagnosis not present

## 2020-06-03 HISTORY — DX: Malignant (primary) neoplasm, unspecified: C80.1

## 2020-06-03 IMAGING — CT CT ABD-PELV W/ CM
3 of 5 series · 14 of 36 positions shown, 17 images · IV contrast (APPLIED)
Comparison: [DATE] and [DATE]

CLINICAL DATA: History of lymphoma, diagnosed in [4J] post
chemotherapy

EXAM:
CT CHEST, ABDOMEN, AND PELVIS WITH CONTRAST
TECHNIQUE: Multidetector CT imaging of the chest, abdomen and pelvis was
performed following the standard protocol during bolus
administration of intravenous contrast.
CONTRAST:  100mL OMNIPAQUE IOHEXOL 300 MG/ML  SOLN

[Series 2: cap with · axial · 0.75mm/px · z∈[-487,+18]mm · 9 of 127 slices shown, 12 images]
[im 13/127  mediastinal]
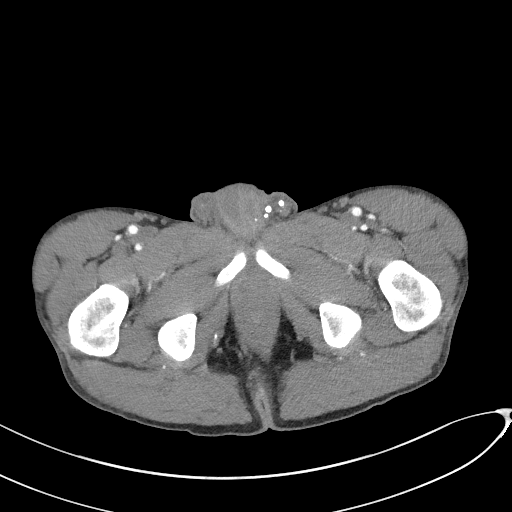
[im 13/127  lung]
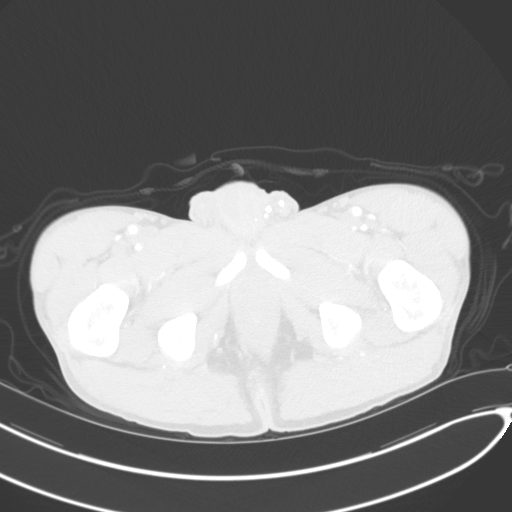
[im 26/127  lung]
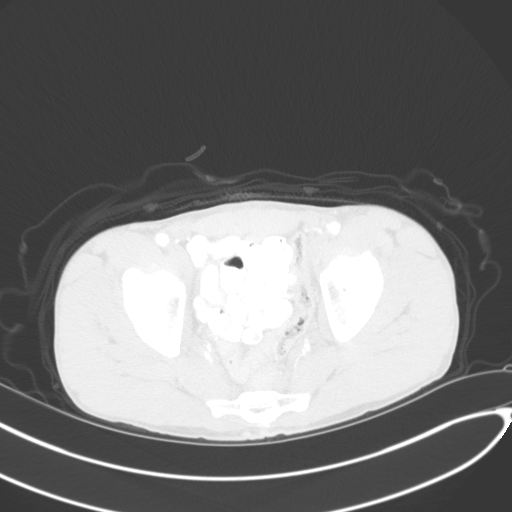
[im 38/127  lung]
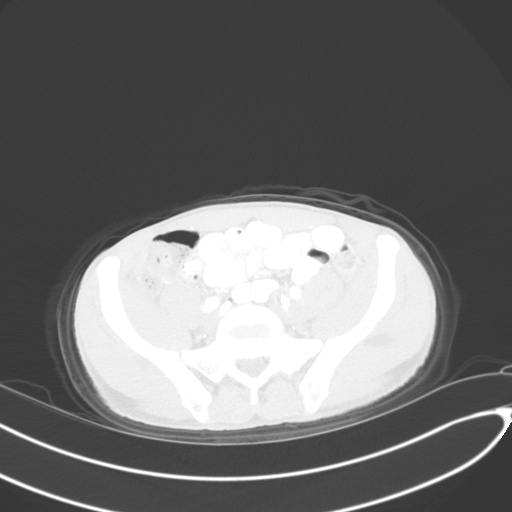
[im 51/127  lung]
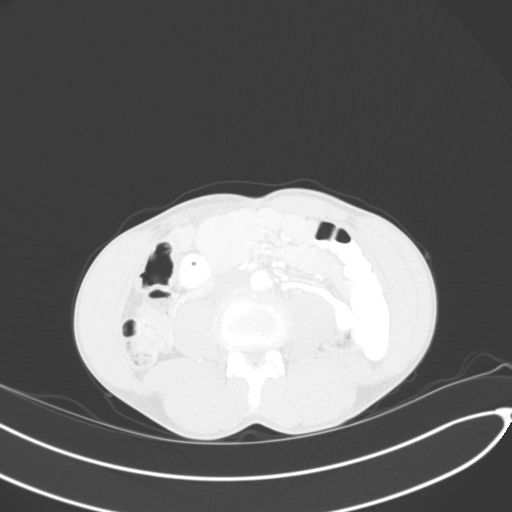
[im 64/127  mediastinal]
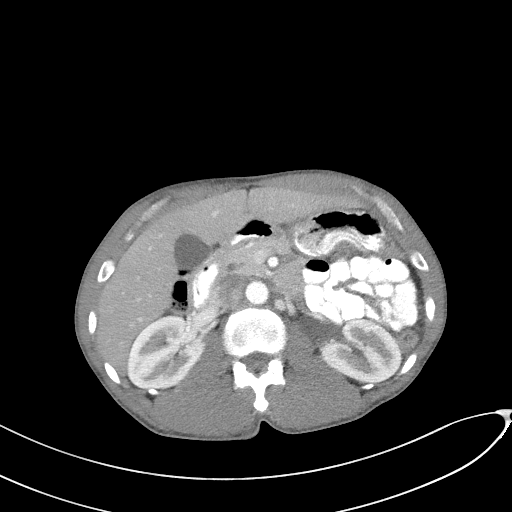
[im 64/127  lung]
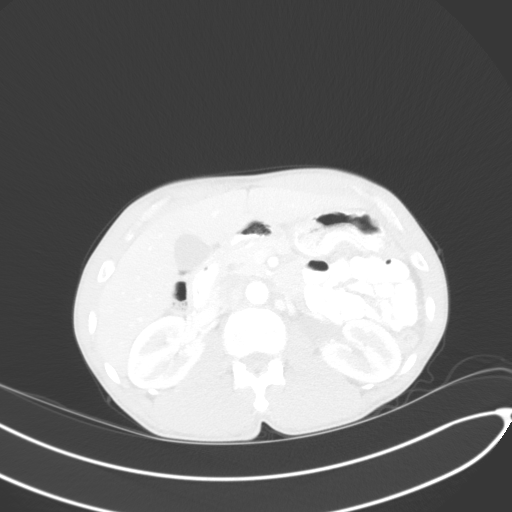
[im 76/127  lung]
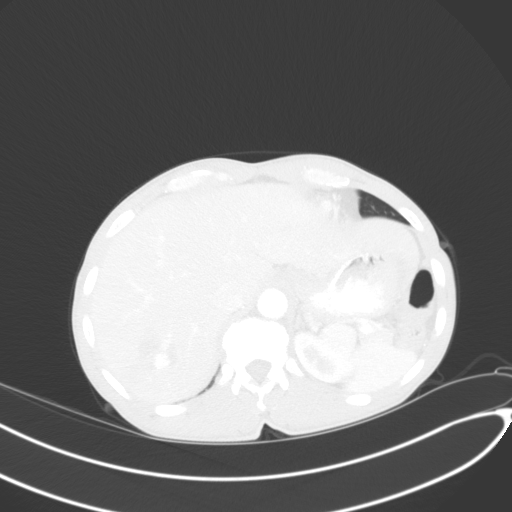
[im 89/127  lung]
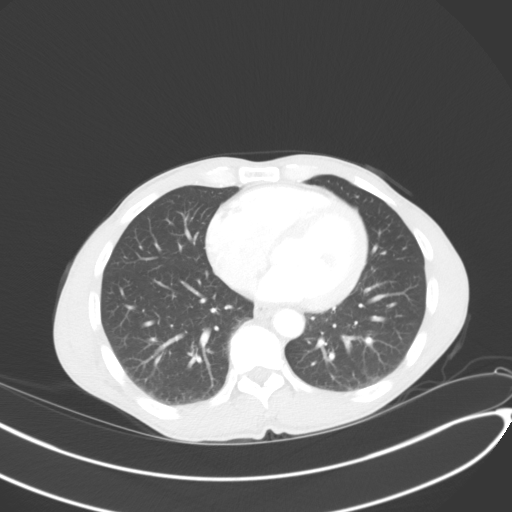
[im 101/127  lung]
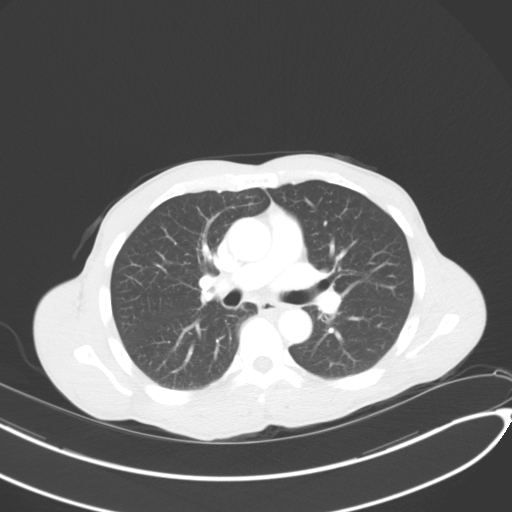
[im 114/127  mediastinal]
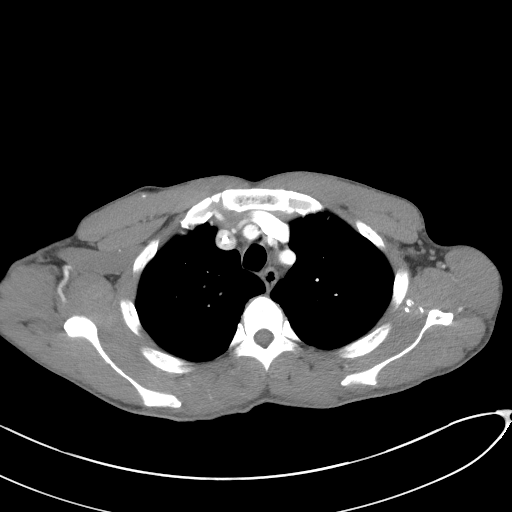
[im 114/127  lung]
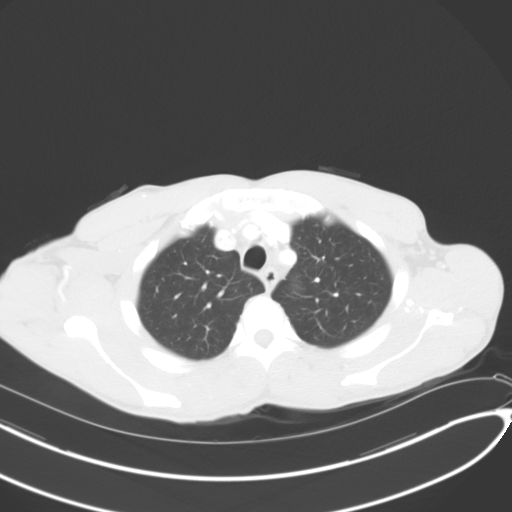

[Series 4: lung · axial · 0.75mm/px · z∈[-187,-139]mm · 2 of 148 slices shown]
[im 13/148  lung]
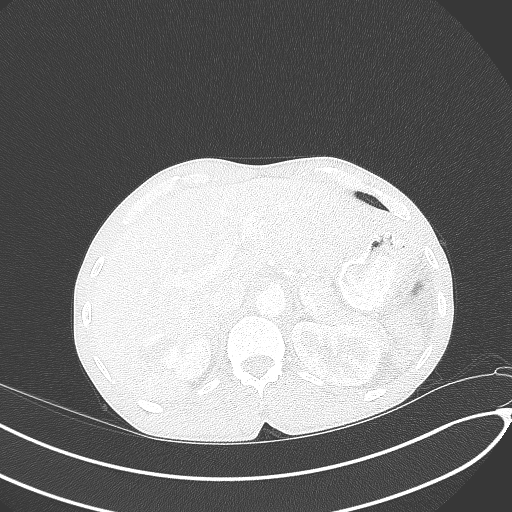
[im 37/148  lung]
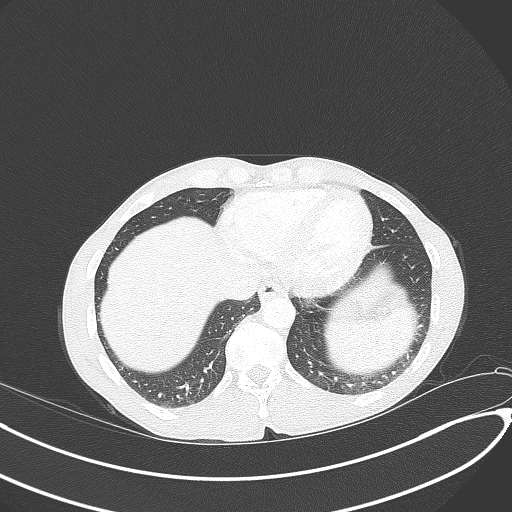

[Series 5: coronals · coronal · 0.82mm/px · 3 of 126 slices shown]
[im 26/126  lung]
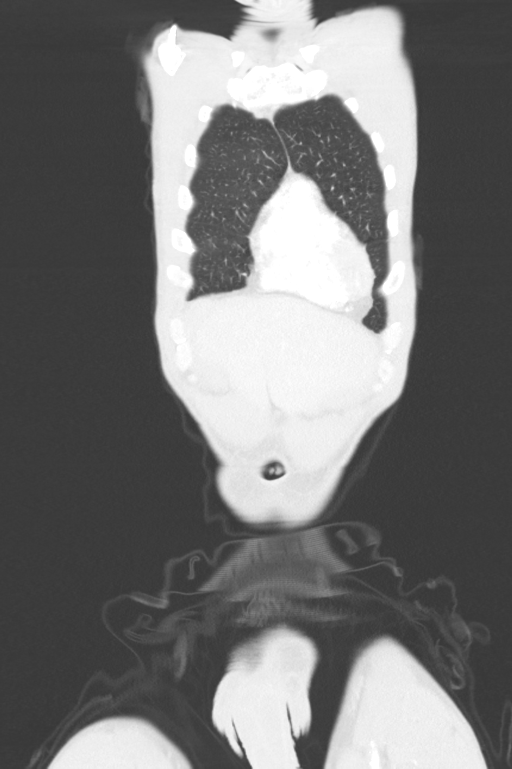
[im 51/126  lung]
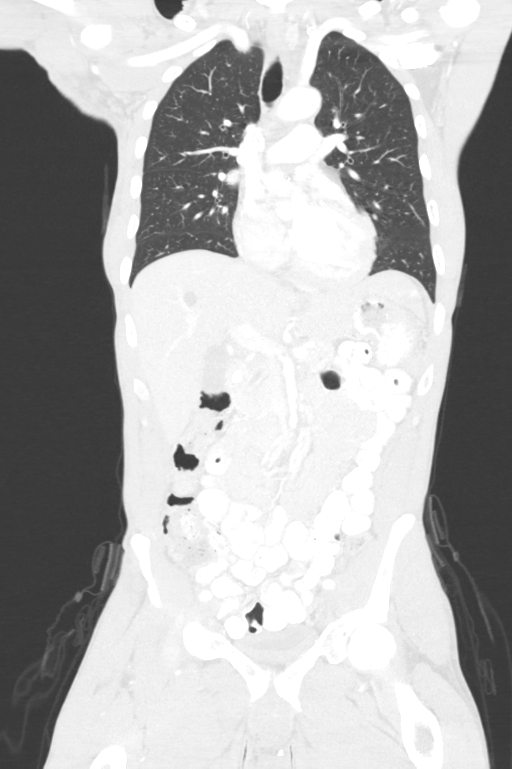
[im 76/126  lung]
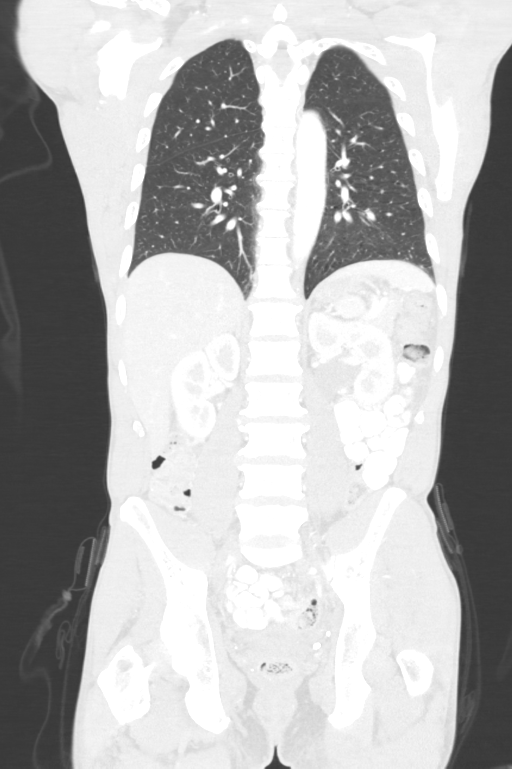

[14 of 36 positions shown; findings below may reference images not displayed]

FINDINGS: CT CHEST FINDINGS

Cardiovascular: RIGHT-sided Port-A-Cath terminates at the caval to
atrial junction. Heart size is normal without pericardial effusion.
Aortic caliber is normal. Scattered atheromatous changes with
calcification in the thoracic aorta. Central pulmonary vasculature
is unremarkable on venous phase assessment.

Mediastinum/Nodes: Esophagus is normal. No thoracic inlet, axillary,
mediastinal or hilar lymphadenopathy.

Lungs/Pleura: No consolidation. No pleural effusion. Airways are
patent. No pulmonary mass or nodule.

Musculoskeletal: No chest wall lesion. No acute musculoskeletal
process of the bony thorax. See below for full musculoskeletal
details.

CT ABDOMEN PELVIS FINDINGS

Hepatobiliary: LEFT hepatic hemangioma and scattered hemangiomata in
the RIGHT hepatic lobe with similar appearance to the prior study.
Small cysts in the RIGHT hepatic lobe are unchanged. The portal vein
is patent. No pericholecystic stranding or biliary duct dilation.

Pancreas: Pancreas unremarkable, no ductal dilation or focal lesion.

Spleen: Spleen normal in size and contour.

Adrenals/Urinary Tract: Adrenal glands are normal. Symmetric renal
enhancement. No hydronephrosis.

Urinary bladder under distended limiting assessment.

Stomach/Bowel: No acute gastrointestinal process. Administered oral
contrast passes into mid and distal small bowel.

Vascular/Lymphatic: Calcified and noncalcified atheromatous plaque
of the abdominal aorta. No aneurysmal dilation. There is no
gastrohepatic or hepatoduodenal ligament lymphadenopathy. No
retroperitoneal or mesenteric lymphadenopathy.

No pelvic sidewall lymphadenopathy.

Reproductive: Prostate unremarkable by CT.

Other: No ascites

Musculoskeletal: No acute or destructive bone process. Spinal
degenerative changes
IMPRESSION: 1. No findings to suggest recurrent disease in the chest, abdomen or
pelvis.
2. Hepatic hemangiomas and cysts as before.
3. Aortic atherosclerosis.

Aortic Atherosclerosis ([4J]-[4J]).

## 2020-06-03 MED ORDER — IOHEXOL 300 MG/ML  SOLN
100.0000 mL | Freq: Once | INTRAMUSCULAR | Status: AC | PRN
  Administered 2020-06-03: 100 mL via INTRAVENOUS

## 2020-06-09 ENCOUNTER — Other Ambulatory Visit: Payer: Self-pay

## 2020-06-09 ENCOUNTER — Inpatient Hospital Stay: Payer: Medicaid Other | Attending: Internal Medicine | Admitting: Internal Medicine

## 2020-06-09 ENCOUNTER — Encounter: Payer: Self-pay | Admitting: Internal Medicine

## 2020-06-09 VITALS — BP 119/85 | HR 65 | Temp 98.5°F | Resp 18 | Ht 66.0 in | Wt 132.5 lb

## 2020-06-09 DIAGNOSIS — R630 Anorexia: Secondary | ICD-10-CM | POA: Insufficient documentation

## 2020-06-09 DIAGNOSIS — R5383 Other fatigue: Secondary | ICD-10-CM | POA: Insufficient documentation

## 2020-06-09 DIAGNOSIS — C8118 Nodular sclerosis classical Hodgkin lymphoma, lymph nodes of multiple sites: Secondary | ICD-10-CM

## 2020-06-09 DIAGNOSIS — F1210 Cannabis abuse, uncomplicated: Secondary | ICD-10-CM | POA: Diagnosis not present

## 2020-06-09 DIAGNOSIS — R634 Abnormal weight loss: Secondary | ICD-10-CM | POA: Insufficient documentation

## 2020-06-09 DIAGNOSIS — I700 Atherosclerosis of aorta: Secondary | ICD-10-CM | POA: Insufficient documentation

## 2020-06-09 DIAGNOSIS — Z79899 Other long term (current) drug therapy: Secondary | ICD-10-CM | POA: Insufficient documentation

## 2020-06-09 DIAGNOSIS — D72819 Decreased white blood cell count, unspecified: Secondary | ICD-10-CM | POA: Insufficient documentation

## 2020-06-09 DIAGNOSIS — K7689 Other specified diseases of liver: Secondary | ICD-10-CM | POA: Diagnosis not present

## 2020-06-09 DIAGNOSIS — D1803 Hemangioma of intra-abdominal structures: Secondary | ICD-10-CM | POA: Diagnosis not present

## 2020-06-09 DIAGNOSIS — R635 Abnormal weight gain: Secondary | ICD-10-CM | POA: Insufficient documentation

## 2020-06-09 NOTE — Progress Notes (Signed)
[place] [phone]   [phone]  OFFICE PROGRESS NOTE  Patient, No Pcp Per No address on file  DIAGNOSIS: Stage IVB Hodgkin's Lymphoma, nodular sclerosis subtype. He presented with a bulky left cervical and supraclavicular lymphadenopathy as well as involvement of the spleen, nodal disease in the porta hepatis, and extensive disease involving the marrow of the pelvis, spine, and ribs. He was diagnosed in September 2020.   IPS score: 4   PRIOR THERAPY: Systemic chemotherapy with brentuximab vedotin 1.2 mg/kg, doxorubicin 25 mg/m2, vinblastine 6 mg/m2, dacarbazine 375 mg/m2 on days 1 and 15 every 28 days. First dose expected on 05/30/2019.   Status post 6 cycles.    CURRENT THERAPY:  Observation.  INTERVAL HISTORY: Jesse [name] 54 y.o. male returns to the clinic today for 6 months follow-up visit.  The patient is feeling fine today with no concerning complaints except for a few pounds of weight loss.  He had lack of appetite few weeks ago but he is now gaining weight again.  He denied having any current chest pain, shortness of breath, cough or hemoptysis.  He has no nausea, vomiting, diarrhea or constipation.  He has no palpable lymphadenopathy.  He has no bleeding, bruises or ecchymosis.  The patient is currently on observation.  He had repeat CT scan of the chest, abdomen pelvis performed recently and he is here for evaluation and discussion of his scan results.  MEDICAL HISTORY: Past Medical History:  Diagnosis Date  . Cancer ([place])   . ETOH abuse   . Lymphadenopathy    left  . Marijuana abuse     ALLERGIES:  has No Known Allergies.  MEDICATIONS:  Current Outpatient Medications  Medication Sig Dispense Refill  . acetaminophen (TYLENOL) 500 MG tablet Take 1,000 mg by mouth every 6 (six) hours as needed.    . gabapentin (NEURONTIN) 100 MG capsule TAKE 1 CAPSULE BY MOUTH 3 TIMES DAILY 90 capsule 2  . ibuprofen (ADVIL) 800  MG tablet Take 800 mg by mouth every 8 (eight) hours as needed. (Patient not taking: Reported on 02/21/2020)    . lidocaine-prilocaine (EMLA) cream Apply 1 application topically as needed. Apply to port site  45 minutes prior to port being accessed 30 g 1  . prochlorperazine (COMPAZINE) 10 MG tablet Take 1 tablet (10 mg total) by mouth every 8 (eight) hours as needed for nausea or vomiting. (Patient not taking: Reported on 02/21/2020) 30 tablet 2   No current facility-administered medications for this visit.    SURGICAL HISTORY:  Past Surgical History:  Procedure Laterality Date  . HERNIA REPAIR     as child  . IR IMAGING GUIDED PORT INSERTION  05/29/2019  . LYMPH NODE BIOPSY Left 05/07/2019   Procedure: LYMPH NODE BIOPSY;  Surgeon: Melida Quitter, MD;  Location: [place];  Service: ENT;  Laterality: Left;    REVIEW OF SYSTEMS:  A comprehensive review of systems was negative except for: Constitutional: positive for fatigue   PHYSICAL EXAMINATION: General appearance: alert, cooperative, fatigued and no distress Head: Normocephalic, without obvious abnormality, atraumatic Neck: no adenopathy, no JVD, supple, symmetrical, trachea midline and thyroid not enlarged, symmetric, no tenderness/mass/nodules Lymph nodes: Cervical, supraclavicular, and axillary nodes normal. Resp: clear to auscultation bilaterally Back: symmetric, no curvature. ROM normal. No CVA tenderness. Cardio: regular rate and rhythm, S1, S2 normal, no murmur, click, rub or gallop GI: soft, non-tender; bowel sounds normal; no masses,  no organomegaly Extremities:  extremities normal, atraumatic, no cyanosis or edema  ECOG PERFORMANCE STATUS: 1 - Symptomatic but completely ambulatory  Blood pressure 119/85, pulse 65, temperature 98.5 F (36.9 C), temperature source Tympanic, resp. rate 18, height 5\' 6"  (1.676 m), weight 132 lb 8 oz (60.1 kg), SpO2 100 %.  LABORATORY DATA: Lab Results  Component Value Date    WBC 2.9 (L) 02/21/2020   HGB 13.8 02/21/2020   HCT 41.6 02/21/2020   MCV 90.8 02/21/2020   PLT 238 02/21/2020      Chemistry      Component Value Date/Time   NA 139 02/21/2020 1006   K 4.0 02/21/2020 1006   CL 104 02/21/2020 1006   CO2 27 02/21/2020 1006   BUN 16 02/21/2020 1006   CREATININE 0.86 02/21/2020 1006      Component Value Date/Time   CALCIUM 9.1 02/21/2020 1006   ALKPHOS 95 02/21/2020 1006   AST 29 02/21/2020 1006   ALT 28 02/21/2020 1006   BILITOT 0.5 02/21/2020 1006       RADIOGRAPHIC STUDIES: CT Chest W Contrast  Result Date: 06/03/2020 CLINICAL DATA:  History of lymphoma, diagnosed in 2020 post chemotherapy EXAM: CT CHEST, ABDOMEN, AND PELVIS WITH CONTRAST TECHNIQUE: Multidetector CT imaging of the chest, abdomen and pelvis was performed following the standard protocol during bolus administration of intravenous contrast. CONTRAST:  138mL OMNIPAQUE IOHEXOL 300 MG/ML  SOLN COMPARISON:  04/18/2019 and November 16, 2019 FINDINGS: CT CHEST FINDINGS Cardiovascular: RIGHT-sided Port-A-Cath terminates at the caval to atrial junction. Heart size is normal without pericardial effusion. Aortic caliber is normal. Scattered atheromatous changes with calcification in the thoracic aorta. Central pulmonary vasculature is unremarkable on venous phase assessment. Mediastinum/Nodes: Esophagus is normal. No thoracic inlet, axillary, mediastinal or hilar lymphadenopathy. Lungs/Pleura: No consolidation. No pleural effusion. Airways are patent. No pulmonary mass or nodule. Musculoskeletal: No chest wall lesion. No acute musculoskeletal process of the bony thorax. See below for full musculoskeletal details. CT ABDOMEN PELVIS FINDINGS Hepatobiliary: LEFT hepatic hemangioma and scattered hemangiomata in the RIGHT hepatic lobe with similar appearance to the prior study. Small cysts in the RIGHT hepatic lobe are unchanged. The portal vein is patent. No pericholecystic stranding or biliary duct  dilation. Pancreas: Pancreas unremarkable, no ductal dilation or focal lesion. Spleen: Spleen normal in size and contour. Adrenals/Urinary Tract: Adrenal glands are normal. Symmetric renal enhancement. No hydronephrosis. Urinary bladder under distended limiting assessment. Stomach/Bowel: No acute gastrointestinal process. Administered oral contrast passes into mid and distal small bowel. Vascular/Lymphatic: Calcified and noncalcified atheromatous plaque of the abdominal aorta. No aneurysmal dilation. There is no gastrohepatic or hepatoduodenal ligament lymphadenopathy. No retroperitoneal or mesenteric lymphadenopathy. No pelvic sidewall lymphadenopathy. Reproductive: Prostate unremarkable by CT. Other: No ascites Musculoskeletal: No acute or destructive bone process. Spinal degenerative changes IMPRESSION: 1. No findings to suggest recurrent disease in the chest, abdomen or pelvis. 2. Hepatic hemangiomas and cysts as before. 3. Aortic atherosclerosis. Aortic Atherosclerosis (ICD10-I70.0). Electronically Signed   By: Zetta Bills M.D.   On: 06/03/2020 12:26   CT Abdomen Pelvis W Contrast  Result Date: 06/03/2020 CLINICAL DATA:  History of lymphoma, diagnosed in 2020 post chemotherapy EXAM: CT CHEST, ABDOMEN, AND PELVIS WITH CONTRAST TECHNIQUE: Multidetector CT imaging of the chest, abdomen and pelvis was performed following the standard protocol during bolus administration of intravenous contrast. CONTRAST:  160mL OMNIPAQUE IOHEXOL 300 MG/ML  SOLN COMPARISON:  04/18/2019 and November 16, 2019 FINDINGS: CT CHEST FINDINGS Cardiovascular: RIGHT-sided Port-A-Cath terminates at the caval to atrial junction. Heart size  is normal without pericardial effusion. Aortic caliber is normal. Scattered atheromatous changes with calcification in the thoracic aorta. Central pulmonary vasculature is unremarkable on venous phase assessment. Mediastinum/Nodes: Esophagus is normal. No thoracic inlet, axillary, mediastinal or hilar  lymphadenopathy. Lungs/Pleura: No consolidation. No pleural effusion. Airways are patent. No pulmonary mass or nodule. Musculoskeletal: No chest wall lesion. No acute musculoskeletal process of the bony thorax. See below for full musculoskeletal details. CT ABDOMEN PELVIS FINDINGS Hepatobiliary: LEFT hepatic hemangioma and scattered hemangiomata in the RIGHT hepatic lobe with similar appearance to the prior study. Small cysts in the RIGHT hepatic lobe are unchanged. The portal vein is patent. No pericholecystic stranding or biliary duct dilation. Pancreas: Pancreas unremarkable, no ductal dilation or focal lesion. Spleen: Spleen normal in size and contour. Adrenals/Urinary Tract: Adrenal glands are normal. Symmetric renal enhancement. No hydronephrosis. Urinary bladder under distended limiting assessment. Stomach/Bowel: No acute gastrointestinal process. Administered oral contrast passes into mid and distal small bowel. Vascular/Lymphatic: Calcified and noncalcified atheromatous plaque of the abdominal aorta. No aneurysmal dilation. There is no gastrohepatic or hepatoduodenal ligament lymphadenopathy. No retroperitoneal or mesenteric lymphadenopathy. No pelvic sidewall lymphadenopathy. Reproductive: Prostate unremarkable by CT. Other: No ascites Musculoskeletal: No acute or destructive bone process. Spinal degenerative changes IMPRESSION: 1. No findings to suggest recurrent disease in the chest, abdomen or pelvis. 2. Hepatic hemangiomas and cysts as before. 3. Aortic atherosclerosis. Aortic Atherosclerosis (ICD10-I70.0). Electronically Signed   By: Zetta Bills M.D.   On: 06/03/2020 12:26    ASSESSMENT AND PLAN: This is a very pleasant 54 years old African-American male recently diagnosed with a stage IV non-Hodgkin lymphoma, nodular sclerosing subtype.  The patient is currently undergoing systemic chemotherapy with brentuximab Vedotin in addition to doxorubicin, vinblastine and dacarbazine.  Status post 6  cycles.  The patient tolerated his treatment fairly well with no concerning adverse effects except for fatigue.  He had repeat PET scan performed recently.  I personally and independently reviewed the PET scan images and discussed the results with the patient today. His last PET scan after the treatment showed almost complete resolution of his disease with no hypermetabolic activity in the neck, chest, abdomen pelvis or the osseous structures.  He has Deauville1. The patient is currently on observation and he is feeling fine today with no concerning complaints. He had repeat CT scan of the chest, abdomen pelvis performed recently.  I personally and independently reviewed the scans and discussed the results with the patient today. His scan showed no concerning findings for disease recurrence or progression. I recommended for him to continue on observation with repeat CT scan of the chest, abdomen pelvis and 6 months. For the leukocytopenia, I advised the patient to avoid any NSAIDs. He was advised to call immediately if he has any other concerning symptoms in the interval. The patient voices understanding of current disease status and treatment options and is in agreement with the current care plan. All questions were answered. The patient knows to call the clinic with any problems, questions or concerns. We can certainly see the patient much sooner if necessary.  Disclaimer: This note was dictated with voice recognition software. Similar sounding words can inadvertently be transcribed and may not be corrected upon review.

## 2020-06-11 ENCOUNTER — Telehealth: Payer: Self-pay | Admitting: Internal Medicine

## 2020-06-11 NOTE — Telephone Encounter (Signed)
Scheduled per los. Called and left msg. Mailed printout  °

## 2020-06-26 ENCOUNTER — Telehealth: Payer: Self-pay | Admitting: Internal Medicine

## 2020-06-26 ENCOUNTER — Inpatient Hospital Stay: Payer: Medicaid Other

## 2020-06-26 NOTE — Telephone Encounter (Signed)
Called pt per 11/11 sch msg to reschedule flush - pt not in , wife said he will call back to reschedule.

## 2020-06-30 ENCOUNTER — Telehealth: Payer: Self-pay | Admitting: Internal Medicine

## 2020-06-30 NOTE — Telephone Encounter (Signed)
Scheduled appt per sch msg. Called and spoke with patient. Confirmed appt  

## 2020-07-04 ENCOUNTER — Other Ambulatory Visit: Payer: Self-pay

## 2020-07-04 ENCOUNTER — Inpatient Hospital Stay: Payer: Medicaid Other | Attending: Internal Medicine

## 2020-07-04 DIAGNOSIS — C8118 Nodular sclerosis classical Hodgkin lymphoma, lymph nodes of multiple sites: Secondary | ICD-10-CM | POA: Diagnosis present

## 2020-07-04 DIAGNOSIS — Z95828 Presence of other vascular implants and grafts: Secondary | ICD-10-CM

## 2020-07-04 DIAGNOSIS — Z452 Encounter for adjustment and management of vascular access device: Secondary | ICD-10-CM | POA: Insufficient documentation

## 2020-07-04 MED ORDER — HEPARIN SOD (PORK) LOCK FLUSH 100 UNIT/ML IV SOLN
500.0000 [IU] | Freq: Once | INTRAVENOUS | Status: AC | PRN
  Administered 2020-07-04: 500 [IU]
  Filled 2020-07-04: qty 5

## 2020-07-04 MED ORDER — SODIUM CHLORIDE 0.9% FLUSH
10.0000 mL | INTRAVENOUS | Status: DC | PRN
  Administered 2020-07-04: 10 mL
  Filled 2020-07-04: qty 10

## 2020-07-18 MED FILL — GABAPENTIN 100 MG CAPSULE: 100 | 30 days supply | Qty: 90 | Fill #1

## 2020-10-07 ENCOUNTER — Telehealth: Payer: Self-pay | Admitting: Medical Oncology

## 2020-10-07 NOTE — Telephone Encounter (Signed)
"  New knot under right jaw " for 1 week. Size of quarter , non tender. Per Dr Julien Nordmann schedule pt for [facility]. Schedule message sent.

## 2020-10-08 ENCOUNTER — Inpatient Hospital Stay: Payer: Medicaid Other | Attending: Medical | Admitting: Medical

## 2020-10-08 ENCOUNTER — Other Ambulatory Visit: Payer: Self-pay

## 2020-10-08 VITALS — BP 123/87 | HR 68 | Temp 97.2°F | Resp 16 | Ht 66.0 in | Wt 138.3 lb

## 2020-10-08 DIAGNOSIS — Z79899 Other long term (current) drug therapy: Secondary | ICD-10-CM | POA: Diagnosis not present

## 2020-10-08 DIAGNOSIS — Z87891 Personal history of nicotine dependence: Secondary | ICD-10-CM | POA: Diagnosis not present

## 2020-10-08 DIAGNOSIS — R590 Localized enlarged lymph nodes: Secondary | ICD-10-CM

## 2020-10-08 DIAGNOSIS — G629 Polyneuropathy, unspecified: Secondary | ICD-10-CM | POA: Diagnosis not present

## 2020-10-08 DIAGNOSIS — C8111 Nodular sclerosis classical Hodgkin lymphoma, lymph nodes of head, face, and neck: Secondary | ICD-10-CM | POA: Insufficient documentation

## 2020-10-08 DIAGNOSIS — F1290 Cannabis use, unspecified, uncomplicated: Secondary | ICD-10-CM | POA: Insufficient documentation

## 2020-10-08 DIAGNOSIS — Z809 Family history of malignant neoplasm, unspecified: Secondary | ICD-10-CM | POA: Diagnosis not present

## 2020-10-08 MED ORDER — AMOXICILLIN-POT CLAVULANATE 875-125 MG PO TABS: 1.0000 | ORAL_TABLET | Freq: Two times a day (BID) | ORAL | 0 refills | Status: DC

## 2020-10-10 NOTE — Progress Notes (Signed)
[facility] Progress Note   Jesse [name] [id number] [birth year] 55 y.o.  Jesse [name] is managed by Dr. Fanny Bien. Jesse [name]  Actively treated with chemotherapy/immunotherapy/hormonal therapy: no  Current therapy: Active surveillance  Next scheduled appointment with provider: 12/08/2020  Assessment: Plan:    Nodular sclerosis Hodgkin lymphoma of lymph nodes of neck ([place]) - Plan: CT Soft Tissue Neck W Contrast  Lymphadenopathy of right cervical region - Plan: amoxicillin-clavulanate (AUGMENTIN) 875-125 MG tablet, CT Soft Tissue Neck W Contrast  Please see After Visit Summary for patient specific instructions.  Future Appointments  Date Time Provider [place]  12/05/2020  9:30 AM [facility] None  12/08/2020 10:30 AM Curt Bears, MD [facility] None    Orders Placed This Encounter  Procedures  . CT Soft Tissue Neck W Contrast       Subjective:   Patient ID:  Jesse [name] is a 55 y.o. (DOB [birth year]) male.  Chief Complaint: No chief complaint on file.   HPI Jesse [name]  is a 55 y.o. male with a diagnosis of a stage IVB Hodgkin's Lymphoma, nodular sclerosis subtype. He originally presented with a bulky left cervical and supraclavicular lymphadenopathy as well as involvement of the spleen, nodal disease in the porta hepatis, and extensive disease involving the marrow of the pelvis, spine, and ribs at his diagnosis in September 2020.  He is followed by Dr. Julien Nordmann and was treated with systemic chemotherapy with brentuximab vedotin 1.2 mg/kg, doxorubicin 25 mg/m2, vinblastine 6 mg/m2, dacarbazine 375 mg/m2 on days 1 and 15 every 28 days.  He was first dosed on 05/30/2019 and is status post 6 cycles.  He was last seen by Dr. Julien Nordmann on 06/09/2020.  He presents to the clinic today with an enlarged left cervical lymph node.  He also reports having some worsening neuropathy.  He is scheduled to be  seen in follow-up by Dr. Julien Nordmann on 12/08/2020 with restaging CT scans completed prior to that.  He reports having some poor dentition and is in need of having a decayed right upper back tooth extracted.  He denies any other issues of concern.  He denies fevers, chills, sweats, nausea, vomiting, constipation, or diarrhea.  He has had no recent injuries.  His appetite is good.  His weight is better.  Medications: I have reviewed the patient's current medications.  Allergies: No Known Allergies  Past Medical History:  Diagnosis Date  . Cancer ([place])   . ETOH abuse   . Lymphadenopathy    left  . Marijuana abuse     Past Surgical History:  Procedure Laterality Date  . HERNIA REPAIR     as child  . IR IMAGING GUIDED PORT INSERTION  05/29/2019  . LYMPH NODE BIOPSY Left 05/07/2019   Procedure: LYMPH NODE BIOPSY;  Surgeon: Melida Quitter, MD;  Location: [place];  Service: ENT;  Laterality: Left;    Family History  Problem Relation Age of Onset  . Cancer Mother   . Cancer Father   . Cancer Sister     Social History   Socioeconomic History  . Marital status: Married    Spouse name: Not on file  . Number of children: Not on file  . Years of education: Not on file  . Highest education level: Not on file  Occupational History  . Not on file  Tobacco Use  . Smoking status: Former Smoker    Packs/day: 1.00    Years: 20.00  Pack years: 20.00    Quit date: 04/24/1999    Years since quitting: 21.4  . Smokeless tobacco: Never Used  Vaping Use  . Vaping Use: Never used  Substance and Sexual Activity  . Alcohol use: Yes    Comment: 12 beers daily  . Drug use: Yes    Types: Marijuana    Comment: daily  . Sexual activity: Yes  Other Topics Concern  . Not on file  Social History Narrative  . Not on file   Social Determinants of Health   Financial Resource Strain: Not on file  Food Insecurity: Not on file  Transportation Needs: Not on file  Physical Activity:  Not on file  Stress: Not on file  Social Connections: Not on file  Intimate Partner Violence: Not on file    Past Medical History, Surgical history, Social history, and Family history were reviewed and updated as appropriate.   Please see review of systems for further details on the patient's review from today.   Review of Systems:  Review of Systems  Constitutional: Negative for chills, diaphoresis and fever.  HENT: Negative for trouble swallowing and voice change.   Respiratory: Negative for cough, chest tightness, shortness of breath and wheezing.   Cardiovascular: Negative for chest pain and palpitations.  Gastrointestinal: Negative for abdominal pain, constipation, diarrhea, nausea and vomiting.  Musculoskeletal: Negative for back pain and myalgias.  Neurological: Negative for dizziness, light-headedness and headaches.  Hematological: Positive for adenopathy.    Objective:   Physical Exam:  BP 123/87 (BP Location: Left Arm, Patient Position: Sitting)   Pulse 68   Temp (!) 97.2 F (36.2 C) (Tympanic)   Resp 16   Ht 5\' 6"  (1.676 m)   Wt 138 lb 4.8 oz (62.7 kg)   SpO2 100%   BMI 22.32 kg/m  ECOG: 0  Physical Exam Constitutional:      General: He is not in acute distress.    Appearance: He is not diaphoretic.  HENT:     Head: Normocephalic and atraumatic.  Eyes:     General: No scleral icterus.       Right eye: No discharge.        Left eye: No discharge.     Conjunctiva/sclera: Conjunctivae normal.  Cardiovascular:     Rate and Rhythm: Normal rate and regular rhythm.     Heart sounds: Normal heart sounds. No murmur heard. No friction rub. No gallop.   Pulmonary:     Effort: Pulmonary effort is normal. No respiratory distress.     Breath sounds: Normal breath sounds. No wheezing or rales.  Chest:  Breasts:     Right: No supraclavicular adenopathy.     Left: No supraclavicular adenopathy.    Abdominal:     General: Bowel sounds are normal. There is no  distension.     Palpations: There is no mass.     Tenderness: There is no abdominal tenderness. There is no guarding.  Musculoskeletal:     Cervical back: Normal range of motion and neck supple. No rigidity or tenderness.     Right lower leg: No edema.     Left lower leg: No edema.  Lymphadenopathy:     Cervical: Cervical adenopathy present.     Right cervical: Superficial cervical adenopathy present. No deep or posterior cervical adenopathy.    Left cervical: No superficial, deep or posterior cervical adenopathy.     Upper Body:     Right upper body: No supraclavicular adenopathy.  Left upper body: No supraclavicular adenopathy.     Comments: Borderline bilateral axillary and inguinal lymph nodes were noted.  These were soft and mobile.  There is an enlarged right anterior cervical lymph node noted.  The lymph node is rubbery and mobile.  Skin:    General: Skin is warm and dry.     Findings: No erythema or rash.  Neurological:     Mental Status: He is alert.     Coordination: Coordination normal.     Gait: Gait normal.  Psychiatric:        Mood and Affect: Mood normal.        Behavior: Behavior normal.        Thought Content: Thought content normal.        Judgment: Judgment normal.     Lab Review:     Component Value Date/Time   NA 139 02/21/2020 1006   K 4.0 02/21/2020 1006   CL 104 02/21/2020 1006   CO2 27 02/21/2020 1006   GLUCOSE 95 02/21/2020 1006   BUN 16 02/21/2020 1006   CREATININE 0.86 02/21/2020 1006   CALCIUM 9.1 02/21/2020 1006   PROT 7.4 02/21/2020 1006   ALBUMIN 4.0 02/21/2020 1006   AST 29 02/21/2020 1006   ALT 28 02/21/2020 1006   ALKPHOS 95 02/21/2020 1006   BILITOT 0.5 02/21/2020 1006   GFRNONAA >60 02/21/2020 1006   GFRAA >60 02/21/2020 1006       Component Value Date/Time   WBC 2.9 (L) 02/21/2020 1006   WBC 13.8 (H) 11/07/2019 1017   RBC 4.58 02/21/2020 1006   HGB 13.8 02/21/2020 1006   HCT 41.6 02/21/2020 1006   PLT 238 02/21/2020  1006   MCV 90.8 02/21/2020 1006   MCH 30.1 02/21/2020 1006   MCHC 33.2 02/21/2020 1006   RDW 13.4 02/21/2020 1006   LYMPHSABS 0.9 02/21/2020 1006   MONOABS 0.3 02/21/2020 1006   EOSABS 0.0 02/21/2020 1006   BASOSABS 0.0 02/21/2020 1006   -------------------------------  Imaging from last 24 hours (if applicable):  Radiology interpretation: No results found.      This patient was seen with Dr. Julien Nordmann with my treatment plan reviewed with him. He expressed agreement with my medical management of this patient.  ADDENDUM: Hematology/Oncology Attending: I had a face-to-face encounter with the patient.  I reviewed his record performed physical exam and recommended his care plan.  This is a very pleasant 55 years old African-American male with history of Hodgkin lymphoma status post 6 cycles of treatment and currently on observation.  He noticed a suspicious lymph node in the right neck area and he presented to the clinic today for evaluation and recommendation regarding this area.  The patient has some dental problem and this likely to be related to his dental infection. We assured the patient about this a small nodule but we will include CT scan of the neck with the upcoming CT of the chest, abdomen pelvis expected in less than 2 months. He was advised to call immediately if there is any further enlargement this lymph node or any other concerning abnormalities.  Disclaimer: This note was dictated with voice recognition software. Similar sounding words can inadvertently be transcribed and may be missed upon review. Eilleen Kempf, MD 10/13/20

## 2020-11-07 ENCOUNTER — Other Ambulatory Visit: Payer: Self-pay

## 2020-11-07 ENCOUNTER — Encounter (HOSPITAL_COMMUNITY): Payer: Self-pay

## 2020-11-07 ENCOUNTER — Ambulatory Visit (HOSPITAL_COMMUNITY)
Admission: RE | Admit: 2020-11-07 | Discharge: 2020-11-07 | Disposition: A | Discharge status: Home / Self Care | Payer: Medicaid Other | Source: Ambulatory Visit | Attending: Medical | Admitting: Medical

## 2020-11-07 DIAGNOSIS — R590 Localized enlarged lymph nodes: Secondary | ICD-10-CM | POA: Diagnosis not present

## 2020-11-07 DIAGNOSIS — C8111 Nodular sclerosis classical Hodgkin lymphoma, lymph nodes of head, face, and neck: Secondary | ICD-10-CM | POA: Diagnosis not present

## 2020-11-07 DIAGNOSIS — Z8571 Personal history of Hodgkin lymphoma: Secondary | ICD-10-CM | POA: Diagnosis not present

## 2020-11-07 DIAGNOSIS — K029 Dental caries, unspecified: Secondary | ICD-10-CM | POA: Diagnosis not present

## 2020-11-07 IMAGING — CT CT NECK W/ CM
4 of 5 series · 14 of 33 positions shown, 16 images · IV contrast (omnipaque)
Comparison: PET-CT [DATE].  CT neck [DATE]

CLINICAL DATA: History of Hodgkin's disease. New lump in the right
neck.

EXAM:
CT NECK WITH CONTRAST
TECHNIQUE: Multidetector CT imaging of the neck was performed using the
standard protocol following the bolus administration of intravenous
contrast.
CONTRAST:  75mL OMNIPAQUE IOHEXOL 300 MG/ML  SOLN

[Series 5: axial bone · axial · 0.56mm/px · z∈[-193,-77]mm · 3 of 117 slices shown, 4 images]
[im 30/117  soft-tissue]
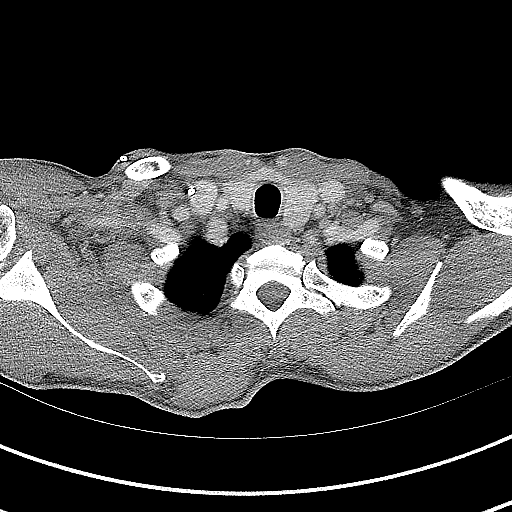
[im 30/117  bone]
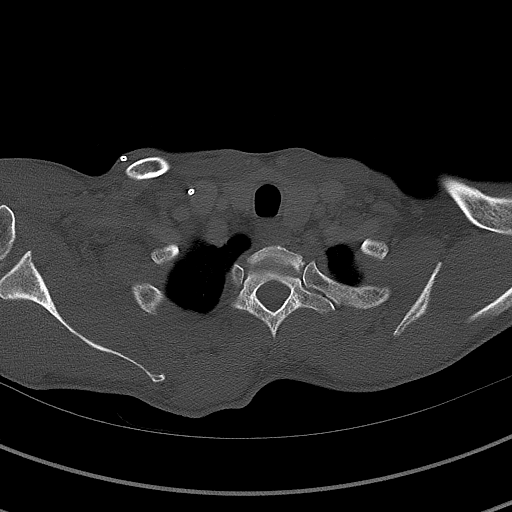
[im 59/117  bone]
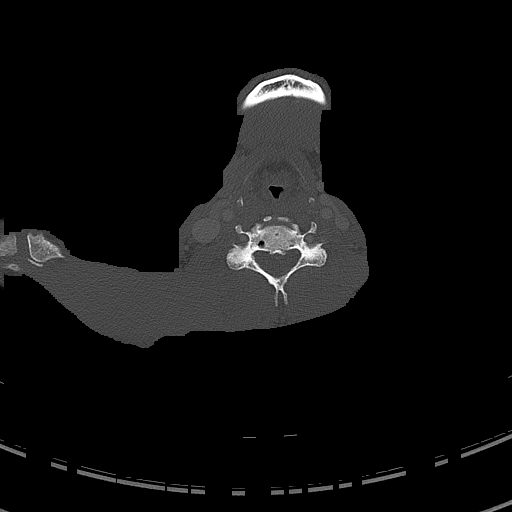
[im 88/117  bone]
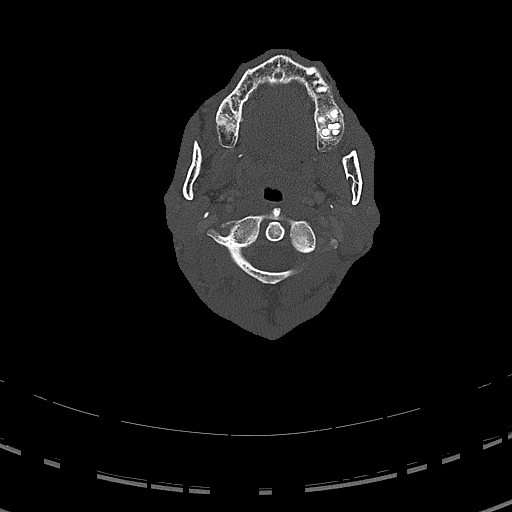

[Series 6: orthogonal (person_name) · axial · 0.39mm/px · z∈[-193,-77]mm · 3 of 118 slices shown]
[im 30/118  bone]
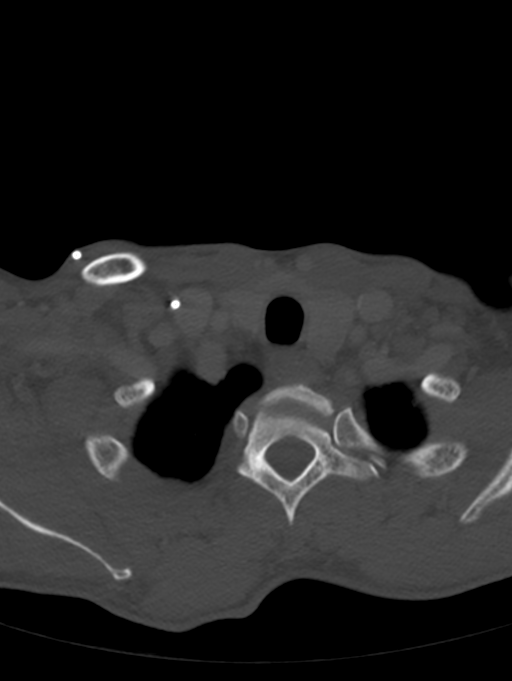
[im 59/118  bone]
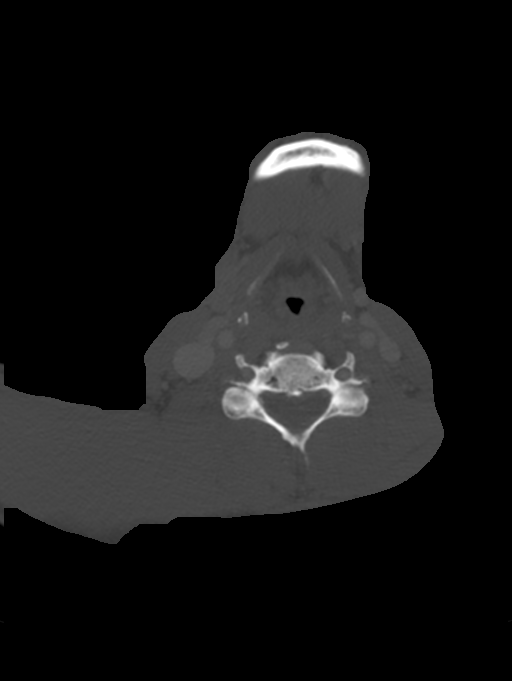
[im 88/118  bone]
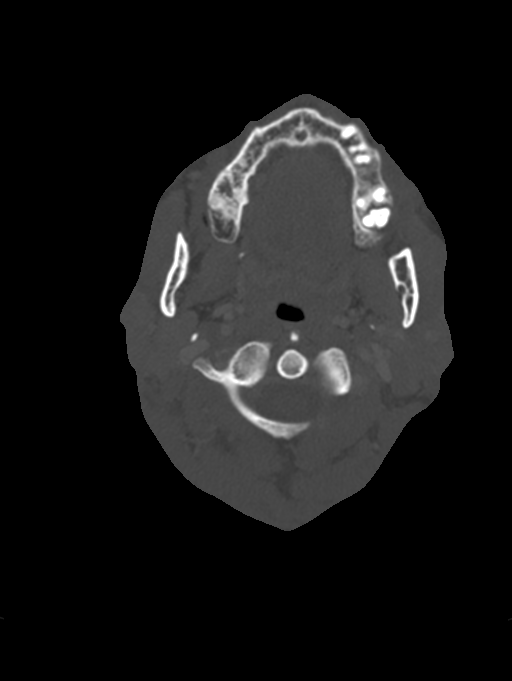

[Series 7: cor neck · coronal · 0.42mm/px · 3 of 129 slices shown]
[im 26/129  bone]
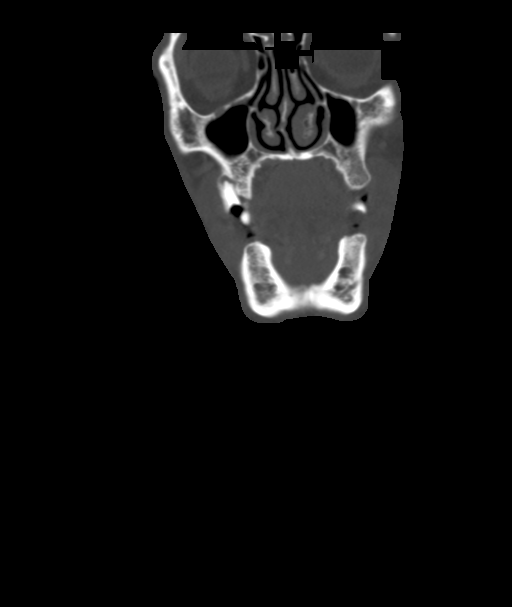
[im 52/129  bone]
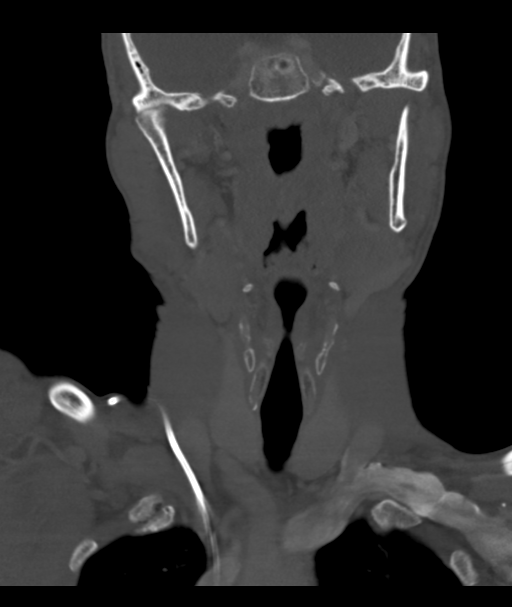
[im 77/129  bone]
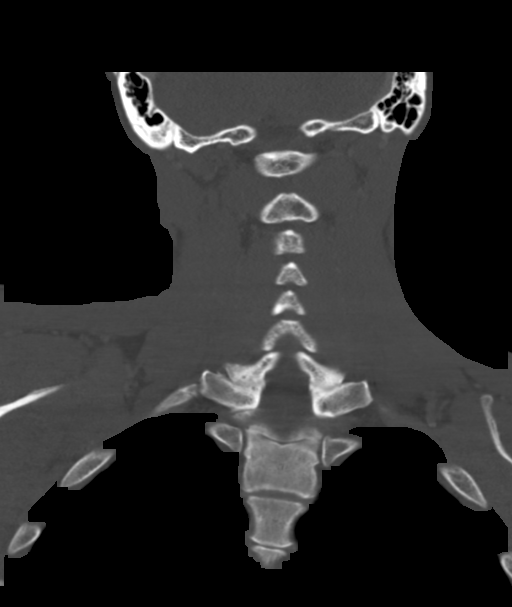

[Series 8: sag neck · sagittal · 0.46mm/px · 5 of 101 slices shown, 6 images]
[im 34/101  bone]
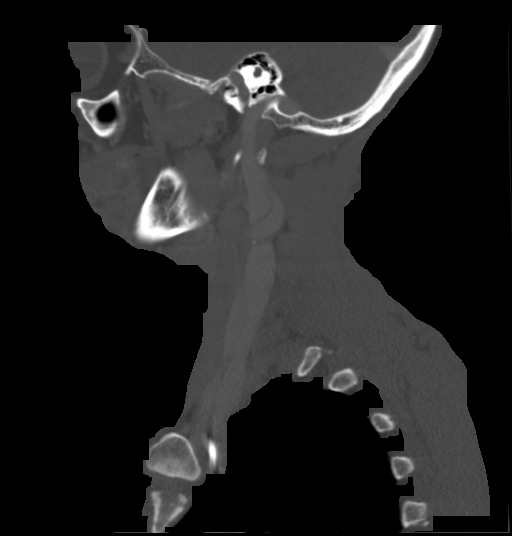
[im 42/101  bone]
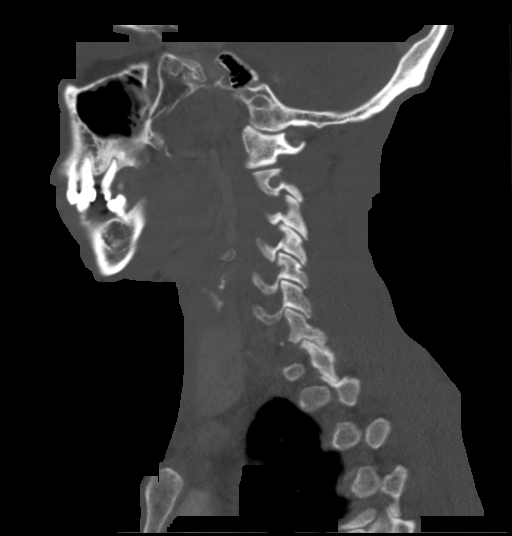
[im 51/101  soft-tissue]
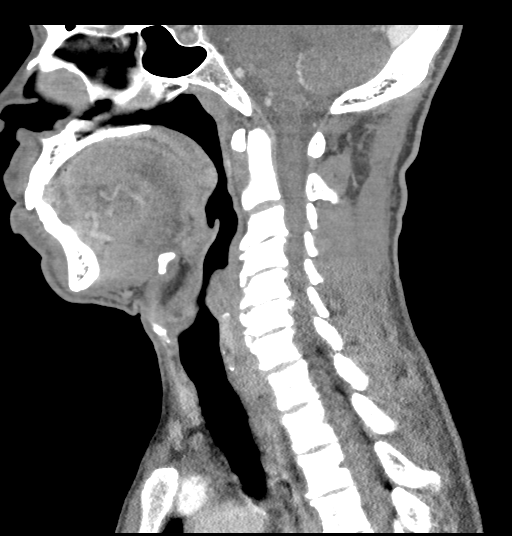
[im 51/101  bone]
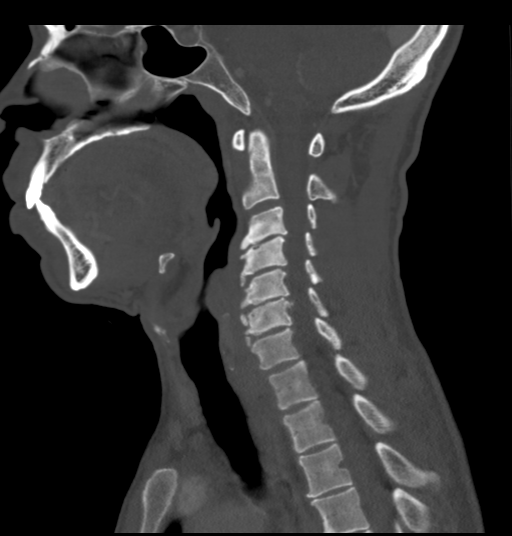
[im 59/101  bone]
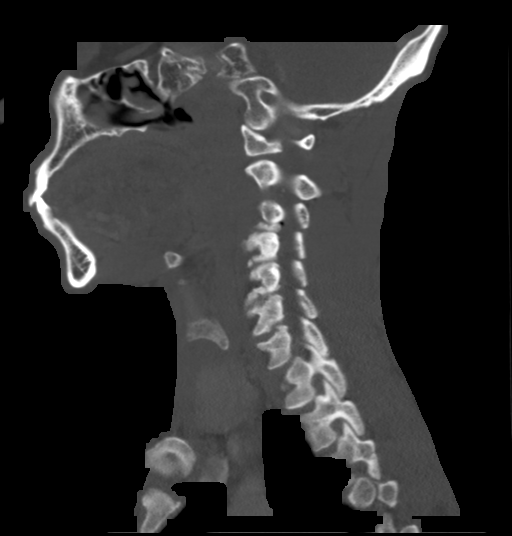
[im 67/101  bone]
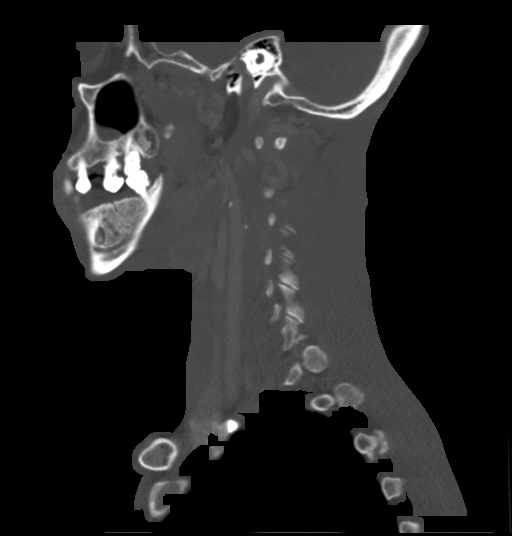

[14 of 33 positions shown; findings below may reference images not displayed]

FINDINGS: Pharynx and larynx: Normal. No mass or swelling.

Salivary glands: No inflammation, mass, or stone.

Thyroid: Negative

Lymph nodes: Previously noted bulky lymphadenopathy in the left neck
has largely resolved. Scattered small subcentimeter lymph nodes are
present posterior to the sternocleidomastoid muscle on the left.

Cystic lymph node in the right neck below the right parotid measures
17 mm and is new since the prior PET-CT. This is most compatible
with a necrotic lymph node. No other enlarged lymph nodes in the
right neck.

Vascular: Right-sided Port-A-Cath noted. Normal vascular
enhancement.

Limited intracranial: Negative

Visualized orbits: Negative

Mastoids and visualized paranasal sinuses: Mild mucosal edema
paranasal sinuses. Mastoid clear bilaterally.

Skeleton: Cervical spondylosis. No acute skeletal abnormality.
Dental caries. Periapical lucency around right lower pre molar

Upper chest: Lung apices clear bilaterally.

Other: None
IMPRESSION: Interval development of necrotic lymph node in the right mid neck
measuring 17 mm. This is most compatible with recurrent tumor.

Resolution of bulky adenopathy in the left neck without recurrent
adenopathy on the left.

Poor dentition

## 2020-11-07 MED ORDER — IOHEXOL 300 MG/ML  SOLN
75.0000 mL | Freq: Once | INTRAMUSCULAR | Status: AC | PRN
  Administered 2020-11-07: 75 mL via INTRAVENOUS

## 2020-11-10 ENCOUNTER — Telehealth: Payer: Self-pay | Admitting: Internal Medicine

## 2020-11-10 NOTE — Telephone Encounter (Signed)
Scheduled appts per 3/28 sch msg. Pt's wife is aware.  

## 2020-11-13 NOTE — Progress Notes (Signed)
FYI, seeing you on 4/5.  Jesse [name]

## 2020-11-14 NOTE — Progress Notes (Signed)
Jesse OFFICE PROGRESS NOTE  Patient, No Pcp Per (Inactive) No address on file  DIAGNOSIS: Stage IVB Hodgkin's Lymphoma, nodular sclerosis subtype. He presented with a bulky left cervical and supraclavicular lymphadenopathy as well as involvement of the spleen, nodal disease in the porta hepatis, and extensive disease involving the marrow of the pelvis, spine, and ribs. He was diagnosed in September 2020.   IPS score: 4  PRIOR THERAPY: Systemic chemotherapy with brentuximab vedotin 1.2 mg/kg, doxorubicin 25 mg/m2, vinblastine 6 mg/m2, dacarbazine 375 mg/m2 on days 1 and 15 every 28 days. First dose expected on 05/30/2019.  Status post 6 cycles.   CURRENT THERAPY: Observation  INTERVAL HISTORY: Jesse [name] 54 y.o. male returns to the clinic today for a follow up visit. In February 2022, the patient noticed a new non-tender right cervical lymph node enlargement. He also had been having some dental concerns. He was placed on Augmentin and had a restaging CT of the chest and neck performed on 11/07/20.   Otherwise, he denies fevers, chills, night sweats, or weight loss. Denies any signs or symptoms infection except for his dental concerns with the right upper back teeth. A few weeks ago he had some issues with diarrhea/constipation which has since resolved. Denies early satiety or abdominal fullness. Denies bleeding or bruising. Denies other lymphadenopathy. He is here for evaluation and to review his scan results and for a more detailed discussion about his current condition and recommended treatment options.    MEDICAL HISTORY: Past Medical History:  Diagnosis Date  . Cancer ([place])   . ETOH abuse   . Lymphadenopathy    left  . Marijuana abuse     ALLERGIES:  has No Known Allergies.  MEDICATIONS:  Current Outpatient Medications  Medication Sig Dispense Refill  . acetaminophen (TYLENOL) 500 MG tablet Take 1,000 mg by mouth every 6 (six) hours as needed.     Marland Kitchen amoxicillin-clavulanate (AUGMENTIN) 875-125 MG tablet Take 1 tablet by mouth 2 (two) times daily. 14 tablet 0  . gabapentin (NEURONTIN) 100 MG capsule TAKE 1 CAPSULE BY MOUTH 3 TIMES DAILY 90 capsule 2  . ibuprofen (ADVIL) 800 MG tablet Take 800 mg by mouth every 8 (eight) hours as needed. (Patient not taking: Reported on 02/21/2020)    . lidocaine-prilocaine (EMLA) cream Apply 1 application topically as needed. Apply to port site  45 minutes prior to port being accessed 30 g 1  . prochlorperazine (COMPAZINE) 10 MG tablet Take 1 tablet (10 mg total) by mouth every 8 (eight) hours as needed for nausea or vomiting. (Patient not taking: Reported on 02/21/2020) 30 tablet 2   No current facility-administered medications for this visit.    SURGICAL HISTORY:  Past Surgical History:  Procedure Laterality Date  . HERNIA REPAIR     as child  . IR IMAGING GUIDED PORT INSERTION  05/29/2019  . LYMPH NODE BIOPSY Left 05/07/2019   Procedure: LYMPH NODE BIOPSY;  Surgeon: Melida Quitter, MD;  Location: [facility];  Service: ENT;  Laterality: Left;    REVIEW OF SYSTEMS:   Review of Systems  Constitutional: Negative for appetite change, chills, fatigue, fever and unexpected weight change.  HENT: Negative for mouth sores, nosebleeds, sore throat and trouble swallowing.   Eyes: Negative for eye problems and icterus.  Respiratory: Negative for cough, hemoptysis, shortness of breath and wheezing.   Cardiovascular: Negative for chest pain and leg swelling.  Gastrointestinal: Negative for abdominal pain, constipation, diarrhea, nausea and vomiting.  Genitourinary: Negative for bladder incontinence, difficulty urinating, dysuria, frequency and hematuria.   Musculoskeletal: Negative for back pain, gait problem, neck pain and neck stiffness.  Skin: Negative for itching and rash.  Neurological: Negative for dizziness, extremity weakness, gait problem, headaches, light-headedness and seizures.   Hematological: Positive for right cervical lymph node enlargement. Does not bruise/bleed easily.  Psychiatric/Behavioral: Negative for confusion, depression and sleep disturbance. The patient is not nervous/anxious.     PHYSICAL EXAMINATION:  There were no vitals taken for this visit.  ECOG PERFORMANCE STATUS: 1 - Symptomatic but completely ambulatory  Physical Exam  Constitutional: Oriented to person, place, and time and well-developed, well-nourished, and in no distress.  HENT:  Head: Normocephalic and atraumatic.  Mouth/Throat: Oropharynx is clear and moist. No oropharyngeal exudate.  Eyes: Conjunctivae are normal. Right eye exhibits no discharge. Left eye exhibits no discharge. No scleral icterus.  Neck: Normal range of motion. Neck supple.  Cardiovascular: Normal rate, regular rhythm, normal heart sounds and intact distal pulses.   Pulmonary/Chest: Effort normal and breath sounds normal. No respiratory distress. No wheezes. No rales.  Abdominal: Soft. Bowel sounds are normal. Exhibits no distension and no mass. There is no tenderness.  Musculoskeletal: Normal range of motion. Exhibits no edema.  Lymphadenopathy:    Positive for right cervical lymph node enlargement.  Neurological: Alert and oriented to person, place, and time. Exhibits normal muscle tone. Gait normal. Coordination normal.  Skin: Skin is warm and dry. No rash noted. Not diaphoretic. No erythema. No pallor.  Psychiatric: Mood, memory and judgment normal.  Vitals reviewed.  LABORATORY DATA: Lab Results  Component Value Date   WBC 2.9 (L) 02/21/2020   HGB 13.8 02/21/2020   HCT 41.6 02/21/2020   MCV 90.8 02/21/2020   PLT 238 02/21/2020      Chemistry      Component Value Date/Time   NA 139 02/21/2020 1006   K 4.0 02/21/2020 1006   CL 104 02/21/2020 1006   CO2 27 02/21/2020 1006   BUN 16 02/21/2020 1006   CREATININE 0.86 02/21/2020 1006      Component Value Date/Time   CALCIUM 9.1 02/21/2020 1006    ALKPHOS 95 02/21/2020 1006   AST 29 02/21/2020 1006   ALT 28 02/21/2020 1006   BILITOT 0.5 02/21/2020 1006       RADIOGRAPHIC STUDIES:  CT Soft Tissue Neck W Contrast  Result Date: 11/07/2020 CLINICAL DATA:  History of Hodgkin's disease. New lump in the right neck. EXAM: CT NECK WITH CONTRAST TECHNIQUE: Multidetector CT imaging of the neck was performed using the standard protocol following the bolus administration of intravenous contrast. CONTRAST:  49mL OMNIPAQUE IOHEXOL 300 MG/ML  SOLN COMPARISON:  PET-CT 11/16/2019.  CT neck 04/18/2019 FINDINGS: Pharynx and larynx: Normal. No mass or swelling. Salivary glands: No inflammation, mass, or stone. Thyroid: Negative Lymph nodes: Previously noted bulky lymphadenopathy in the left neck has largely resolved. Scattered small subcentimeter lymph nodes are present posterior to the sternocleidomastoid muscle on the left. Cystic lymph node in the right neck below the right parotid measures 17 mm and is new since the prior PET-CT. This is most compatible with a necrotic lymph node. No other enlarged lymph nodes in the right neck. Vascular: Right-sided Port-A-Cath noted. Normal vascular enhancement. Limited intracranial: Negative Visualized orbits: Negative Mastoids and visualized paranasal sinuses: Mild mucosal edema paranasal sinuses. Mastoid clear bilaterally. Skeleton: Cervical spondylosis. No acute skeletal abnormality. Dental caries. Periapical lucency around right lower pre molar Upper chest: Lung apices clear  bilaterally. Other: None IMPRESSION: Interval development of necrotic lymph node in the right mid neck measuring 17 mm. This is most compatible with recurrent tumor. Resolution of bulky adenopathy in the left neck without recurrent adenopathy on the left. Poor dentition Electronically Signed   By: Franchot Gallo M.D.   On: 11/07/2020 15:58     ASSESSMENT/PLAN:  This is a very pleasant 55years old African-American male diagnosed with a stage IV  non-Hodgkin lymphoma, nodular sclerosing subtype. He presented with a bulky left cervical and supraclavicular lymphadenopathy as well as involvement of the spleen, nodal disease in the porta hepatis, and extensive disease involving the marrow of the pelvis, spine, and ribs. He was diagnosed in September 2020.  He underwent systemic chemotherapy with brentuximab Vedotin in addition to doxorubicin, vinblastine and dacarbazine.  Status post 6 cycles. His last dose was in October 2021.   His last PET scan after the treatment showed almost complete resolution of his disease with no hypermetabolic activity in the neck, chest, abdomen pelvis or the osseous structures.  He has Deauville1. The patient is currently on observation and he is feeling fine today with no concerning complaints.  In February 2022, the patient noted a new non-tender right cervical lymph node. He recently had a CT chest and neck to further evaluate this. The scan showed Interval development of necrotic lymph node in the right mid neck measuring 17 mm. This is most compatible with recurrent tumor.   The patient was seen with Dr. Julien Nordmann. Dr. Julien Nordmann personally and independently reviewed the scan results and discussed the results with the patient. Dr. Julien Nordmann recommends arranging for a PET scan to see if this lesion is hypermetabolic. Additionally, this will let us know if there are other regions suspicious for disease recurrence. If suspicious for disease recurrence, Dr. Julien Nordmann discussed the next steps would likely involve a biopsy. If the PET scan is negative, then the patient will continue on observation with a restaging CT in a few months.   I will arrange for the PET scan to be done in the next 1-2 weeks  We will see the patient back for a follow up visit in 2-3 weeks to review the results of the PET scan and for a more detailed discussion about his current condition and next steps.   The patient was advised to call immediately  if she has any concerning symptoms in the interval. The patient voices understanding of current disease status and treatment options and is in agreement with the current care plan. All questions were answered. The patient knows to call the clinic with any problems, questions or concerns. We can certainly see the patient much sooner if necessary  No orders of the defined types were placed in this encounter.     Jesse Careaga L Sundra Haddix, PA-C 11/14/20  ADDENDUM: Hematology/Oncology Attending: I had a face-to-face encounter with the patient today.  I reviewed his scan and recommended his care plan.  This is a very pleasant 55 years old African-American male diagnosed with stage IV Hodgkin's lymphoma, nodular sclerosing subtype presented with bulky left supraclavicular as well as cervical lymphadenopathy and involvement of the spleen, nodal disease in the porta hepatis and extensive disease involving the marrow of the pelvis, spine and ribs in September 2020. The patient underwent systemic chemotherapy with brentuximab without an in addition to AVD for 6 cycles with almost complete response.  He has been on observation since that time and feeling fine. He presented few weeks ago with right neck  lump.  We treated him with a course of antibiotics but we will also order CT scan of the neck for further evaluation of this lesion. The patient is here today for discussion of his discuss results and recommendation regarding his condition.  The lymph node is smaller after the treatment with antibiotic but the CT of the neck showed persistent necrotic lymph node in the right cervical area highly suspicious for recurrent disease. I recommended for the patient to have a PET scan for reevaluation of his disease and to rule out any disease recurrence. We will see him back for follow-up visit in around 2-3 weeks for discussion of his PET scan results and recommendation regarding his condition. The patient was  advised to call immediately if he has any other concerning symptoms in the interval. The total time spent in the appointment was 30 minutes.  Disclaimer: This note was dictated with voice recognition software. Similar sounding words can inadvertently be transcribed and may be missed upon review. Eilleen Kempf, MD 11/18/20

## 2020-11-17 ENCOUNTER — Telehealth: Payer: Self-pay | Admitting: Internal Medicine

## 2020-11-17 NOTE — Telephone Encounter (Signed)
Moved 4/25 appt to 5/2 per provider PAL. Called, not able to leave msg. Mailed printout

## 2020-11-18 ENCOUNTER — Inpatient Hospital Stay: Payer: Medicaid Other | Attending: Medical

## 2020-11-18 ENCOUNTER — Other Ambulatory Visit: Payer: Self-pay

## 2020-11-18 ENCOUNTER — Encounter: Payer: Self-pay | Admitting: Physician Assistant

## 2020-11-18 ENCOUNTER — Inpatient Hospital Stay (HOSPITAL_BASED_OUTPATIENT_CLINIC_OR_DEPARTMENT_OTHER): Payer: Medicaid Other | Admitting: Physician Assistant

## 2020-11-18 VITALS — BP 132/86 | HR 73 | Temp 97.9°F | Resp 14 | Ht 66.0 in | Wt 135.3 lb

## 2020-11-18 DIAGNOSIS — C8111 Nodular sclerosis classical Hodgkin lymphoma, lymph nodes of head, face, and neck: Secondary | ICD-10-CM | POA: Diagnosis not present

## 2020-11-18 DIAGNOSIS — M47812 Spondylosis without myelopathy or radiculopathy, cervical region: Secondary | ICD-10-CM | POA: Diagnosis not present

## 2020-11-18 DIAGNOSIS — R918 Other nonspecific abnormal finding of lung field: Secondary | ICD-10-CM | POA: Diagnosis not present

## 2020-11-18 DIAGNOSIS — R600 Localized edema: Secondary | ICD-10-CM | POA: Diagnosis not present

## 2020-11-18 DIAGNOSIS — Z79899 Other long term (current) drug therapy: Secondary | ICD-10-CM | POA: Insufficient documentation

## 2020-11-18 DIAGNOSIS — K029 Dental caries, unspecified: Secondary | ICD-10-CM | POA: Diagnosis not present

## 2020-11-18 DIAGNOSIS — C8110 Nodular sclerosis classical Hodgkin lymphoma, unspecified site: Secondary | ICD-10-CM

## 2020-11-18 DIAGNOSIS — R590 Localized enlarged lymph nodes: Secondary | ICD-10-CM | POA: Diagnosis not present

## 2020-11-18 LAB — CMP (CANCER CENTER ONLY)
ALT: 21 U/L (ref 0–44)
AST: 17 U/L (ref 15–41)
Albumin: 3.9 g/dL (ref 3.5–5.0)
Alkaline Phosphatase: 105 U/L (ref 38–126)
Anion gap: 12 (ref 5–15)
BUN: 19 mg/dL (ref 6–20)
CO2: 25 mmol/L (ref 22–32)
Calcium: 8.6 mg/dL — ABNORMAL LOW (ref 8.9–10.3)
Chloride: 104 mmol/L (ref 98–111)
Creatinine: 0.91 mg/dL (ref 0.61–1.24)
GFR, Estimated: >60 mL/min (ref >60)
Glucose, Bld: 149 mg/dL — ABNORMAL HIGH (ref 70–99)
Potassium: 4.3 mmol/L (ref 3.5–5.1)
Sodium: 141 mmol/L (ref 135–145)
Total Bilirubin: 0.5 mg/dL (ref 0.3–1.2)
Total Protein: 7.4 g/dL (ref 6.5–8.1)

## 2020-11-18 LAB — CBC WITH DIFFERENTIAL (CANCER CENTER ONLY)
Abs Immature Granulocytes: 0.03 10*3/uL (ref 0.00–0.07)
Basophils Absolute: 0 10*3/uL (ref 0.0–0.1)
Basophils Relative: 1 %
Eosinophils Absolute: 0.1 10*3/uL (ref 0.0–0.5)
Eosinophils Relative: 2 %
HCT: 40.9 % (ref 39.0–52.0)
Hemoglobin: 13.5 g/dL (ref 13.0–17.0)
Immature Granulocytes: 1 %
Lymphocytes Relative: 35 %
Lymphs Abs: 1.6 10*3/uL (ref 0.7–4.0)
MCH: 30.3 pg (ref 26.0–34.0)
MCHC: 33 g/dL (ref 30.0–36.0)
MCV: 91.9 fL (ref 80.0–100.0)
Monocytes Absolute: 0.5 10*3/uL (ref 0.1–1.0)
Monocytes Relative: 11 %
Neutro Abs: 2.3 10*3/uL (ref 1.7–7.7)
Neutrophils Relative %: 50 %
Platelet Count: 330 10*3/uL (ref 150–400)
RBC: 4.45 MIL/uL (ref 4.22–5.81)
RDW: 12.9 % (ref 11.5–15.5)
WBC Count: 4.5 10*3/uL (ref 4.0–10.5)
nRBC: 0 % (ref 0.0–0.2)

## 2020-11-18 LAB — LACTATE DEHYDROGENASE: LDH: 166 U/L (ref 98–192)

## 2020-11-21 ENCOUNTER — Telehealth: Payer: Self-pay | Admitting: Internal Medicine

## 2020-11-21 NOTE — Telephone Encounter (Signed)
Scheduled per los. Called and spoke with patients wife. Confirmed appt  

## 2020-11-27 ENCOUNTER — Telehealth: Payer: Self-pay | Admitting: Physician Assistant

## 2020-11-27 NOTE — Telephone Encounter (Signed)
I called the patient and spoke to his wife. The patient's PET scan was authorized yesterday. It has not been scheduled at this time. His follow up is scheduled for 12/04/20. I gave the patient's wife the phone number to radiology scheduling and instructed them to call and schedule the scan before the appointment on 12/04/20. She expressed understanding.

## 2020-11-28 ENCOUNTER — Telehealth: Payer: Self-pay | Admitting: Physician Assistant

## 2020-11-28 ENCOUNTER — Telehealth: Payer: Self-pay

## 2020-11-28 NOTE — Telephone Encounter (Signed)
Scheduled appt per 4/14 sch msg. Called pt, no answer. Left msg with appt date and time.

## 2020-11-28 NOTE — Telephone Encounter (Signed)
Pt has called indicating he missed a call. I advised the pt the scheduler left him a voicemail that his appt was moved to 4/28 with an 8:45am arrival.  Then pts wife also LM. I called her back and left a VM with this same information.  Pts wife called back again indicating he missed a call. I have called her back again and left this information again.

## 2020-12-01 ENCOUNTER — Emergency Department (HOSPITAL_COMMUNITY)
Admission: EM | Admit: 2020-12-01 | Discharge: 2020-12-01 | Disposition: A | Discharge status: Home / Self Care | Payer: Medicaid Other | Attending: Emergency Medicine | Admitting: Emergency Medicine

## 2020-12-01 ENCOUNTER — Emergency Department (HOSPITAL_COMMUNITY): Payer: Medicaid Other

## 2020-12-01 ENCOUNTER — Other Ambulatory Visit: Payer: Self-pay

## 2020-12-01 DIAGNOSIS — R1031 Right lower quadrant pain: Secondary | ICD-10-CM | POA: Diagnosis not present

## 2020-12-01 DIAGNOSIS — M545 Low back pain, unspecified: Secondary | ICD-10-CM | POA: Diagnosis not present

## 2020-12-01 DIAGNOSIS — M5416 Radiculopathy, lumbar region: Secondary | ICD-10-CM

## 2020-12-01 DIAGNOSIS — M79651 Pain in right thigh: Secondary | ICD-10-CM | POA: Insufficient documentation

## 2020-12-01 DIAGNOSIS — M48061 Spinal stenosis, lumbar region without neurogenic claudication: Secondary | ICD-10-CM

## 2020-12-01 DIAGNOSIS — Z859 Personal history of malignant neoplasm, unspecified: Secondary | ICD-10-CM | POA: Insufficient documentation

## 2020-12-01 DIAGNOSIS — C8590 Non-Hodgkin lymphoma, unspecified, unspecified site: Secondary | ICD-10-CM | POA: Diagnosis not present

## 2020-12-01 DIAGNOSIS — Z87891 Personal history of nicotine dependence: Secondary | ICD-10-CM | POA: Diagnosis not present

## 2020-12-01 LAB — CBC WITH DIFFERENTIAL/PLATELET
Abs Immature Granulocytes: 0 10*3/uL (ref 0.00–0.07)
Basophils Absolute: 0 10*3/uL (ref 0.0–0.1)
Basophils Relative: 1 %
Eosinophils Absolute: 0.1 10*3/uL (ref 0.0–0.5)
Eosinophils Relative: 2 %
HCT: 38.1 % — ABNORMAL LOW (ref 39.0–52.0)
Hemoglobin: 12.6 g/dL — ABNORMAL LOW (ref 13.0–17.0)
Immature Granulocytes: 0 %
Lymphocytes Relative: 43 %
Lymphs Abs: 1.2 10*3/uL (ref 0.7–4.0)
MCH: 31.3 pg (ref 26.0–34.0)
MCHC: 33.1 g/dL (ref 30.0–36.0)
MCV: 94.8 fL (ref 80.0–100.0)
Monocytes Absolute: 0.4 10*3/uL (ref 0.1–1.0)
Monocytes Relative: 15 %
Neutro Abs: 1.1 10*3/uL — ABNORMAL LOW (ref 1.7–7.7)
Neutrophils Relative %: 39 %
Platelets: 269 10*3/uL (ref 150–400)
RBC: 4.02 MIL/uL — ABNORMAL LOW (ref 4.22–5.81)
RDW: 12.9 % (ref 11.5–15.5)
WBC: 2.8 10*3/uL — ABNORMAL LOW (ref 4.0–10.5)
nRBC: 0 % (ref 0.0–0.2)

## 2020-12-01 LAB — URINALYSIS, ROUTINE W REFLEX MICROSCOPIC
Bilirubin Urine: NEGATIVE
Glucose, UA: NEGATIVE mg/dL
Hgb urine dipstick: NEGATIVE
Ketones, ur: NEGATIVE mg/dL
Leukocytes,Ua: NEGATIVE
Nitrite: NEGATIVE
Protein, ur: NEGATIVE mg/dL
Specific Gravity, Urine: 1.008 (ref 1.005–1.030)
pH: 8 (ref 5.0–8.0)

## 2020-12-01 LAB — COMPREHENSIVE METABOLIC PANEL
ALT: 19 U/L (ref 0–44)
AST: 22 U/L (ref 15–41)
Albumin: 3.5 g/dL (ref 3.5–5.0)
Alkaline Phosphatase: 69 U/L (ref 38–126)
Anion gap: 5 (ref 5–15)
BUN: 13 mg/dL (ref 6–20)
CO2: 24 mmol/L (ref 22–32)
Calcium: 8.9 mg/dL (ref 8.9–10.3)
Chloride: 106 mmol/L (ref 98–111)
Creatinine, Ser: 0.79 mg/dL (ref 0.61–1.24)
GFR, Estimated: >60 mL/min (ref >60)
Glucose, Bld: 121 mg/dL — ABNORMAL HIGH (ref 70–99)
Potassium: 3.8 mmol/L (ref 3.5–5.1)
Sodium: 135 mmol/L (ref 135–145)
Total Bilirubin: 0.8 mg/dL (ref 0.3–1.2)
Total Protein: 6.1 g/dL — ABNORMAL LOW (ref 6.5–8.1)

## 2020-12-01 IMAGING — MR MR LUMBAR SPINE WO/W CM
4 of 7 series · 24 of 48 positions shown · IV contrast (gadavist)
Comparison: Abdominal CT [DATE]

CLINICAL DATA: Bone neoplasm with recurrence suspected. History of
lymphoma in [EB].

Patient reports low back pain radiating down the right leg and
beginning last week
EXAM:
MRI LUMBAR SPINE WITHOUT AND WITH CONTRAST
TECHNIQUE: Multiplanar and multiecho pulse sequences of the lumbar spine were
obtained without and with intravenous contrast.
CONTRAST:  6mL GADAVIST GADOBUTROL 1 MMOL/ML IV SOLN

[Series 7: T2 · sagittal · 4.0mm · 0.73mm/px · 4 of 16 slices shown (1 of 2)]
[im 1/16]
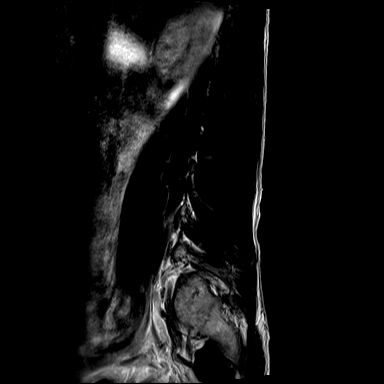
[im 6/16]
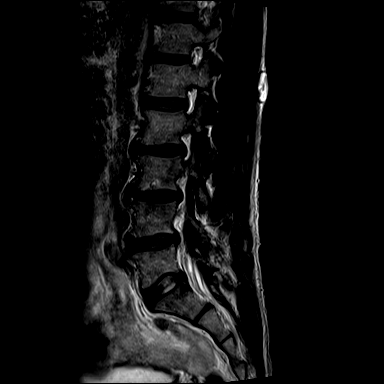
[im 11/16]
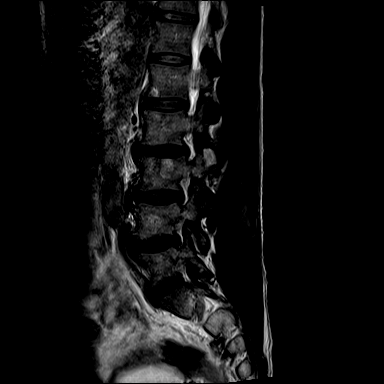
[im 16/16]
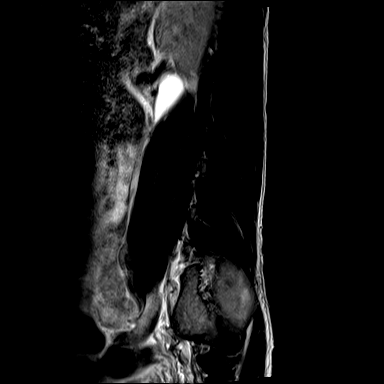

[Series 9: T1 · sagittal · 4.0mm · 0.88mm/px · 5 of 16 slices shown (1 of 2)]
[im 1/16]
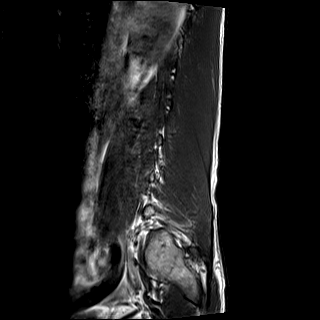
[im 4/16]
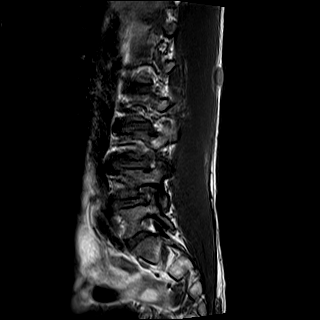
[im 8/16]
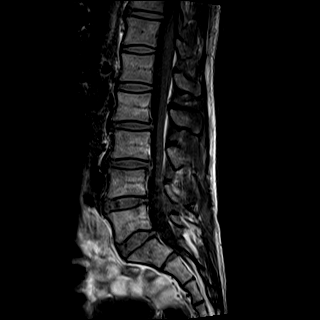
[im 12/16]
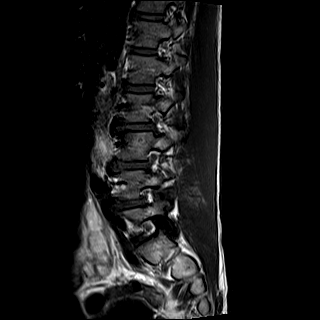
[im 16/16]
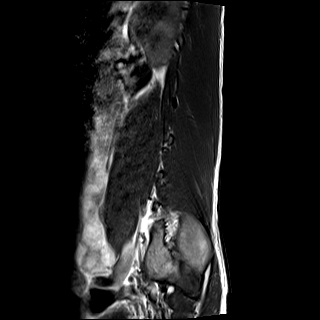

[Series 10: T2 · axial · 5.0mm · 0.57mm/px · z∈[-172,+71]mm · 8 of 32 slices shown (2 of 2)]
[im 1/32]
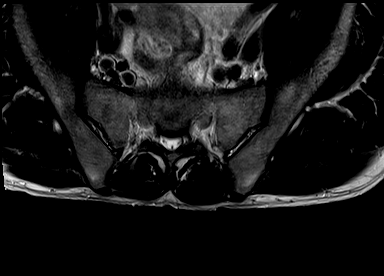
[im 4/32]
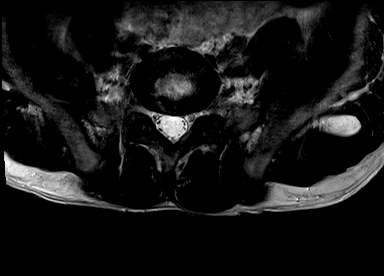
[im 11/32]
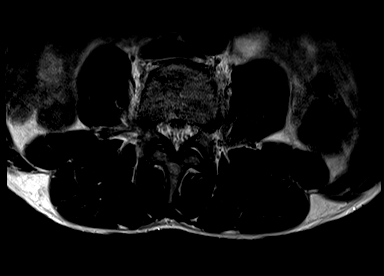
[im 14/32]
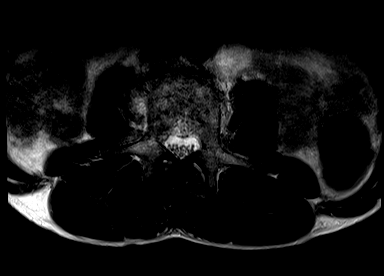
[im 18/32]
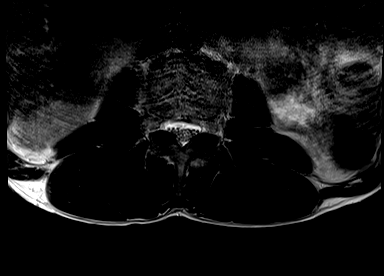
[im 21/32]
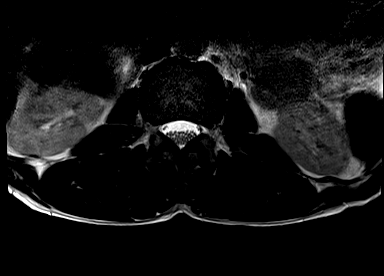
[im 28/32]
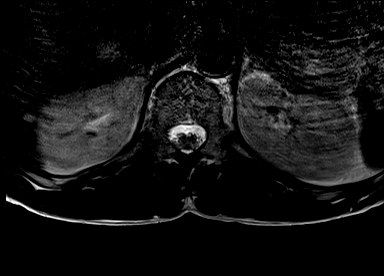
[im 32/32]
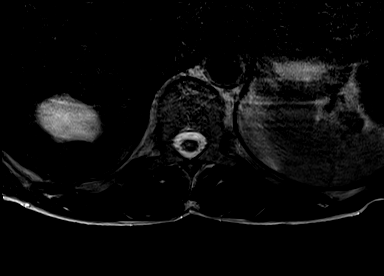

[Series 11: T1 · axial · 5.0mm · 0.34mm/px · z∈[-180,+42]mm · 7 of 32 slices shown (2 of 2)]
[im 1/32]
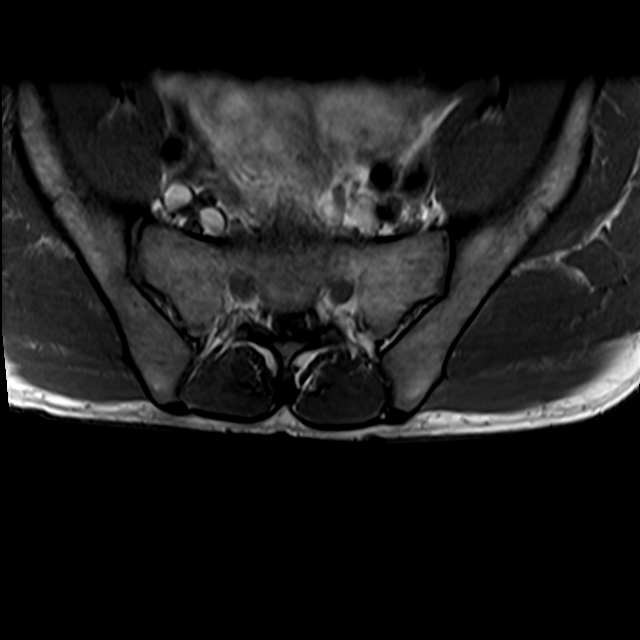
[im 4/32]
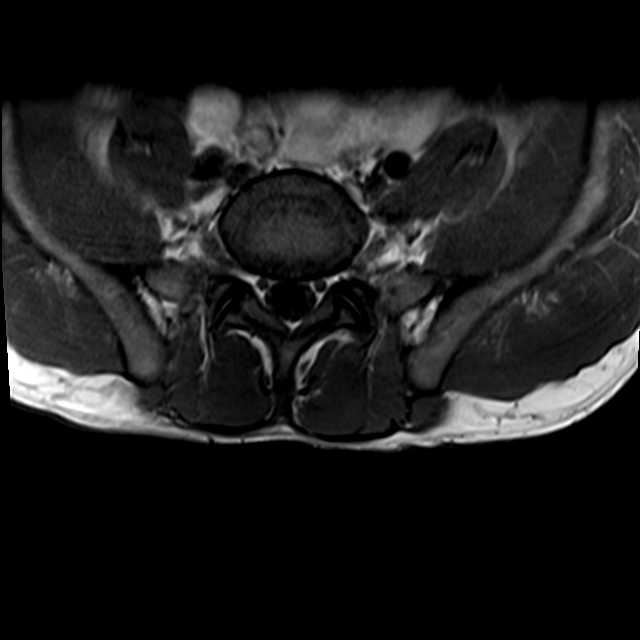
[im 11/32]
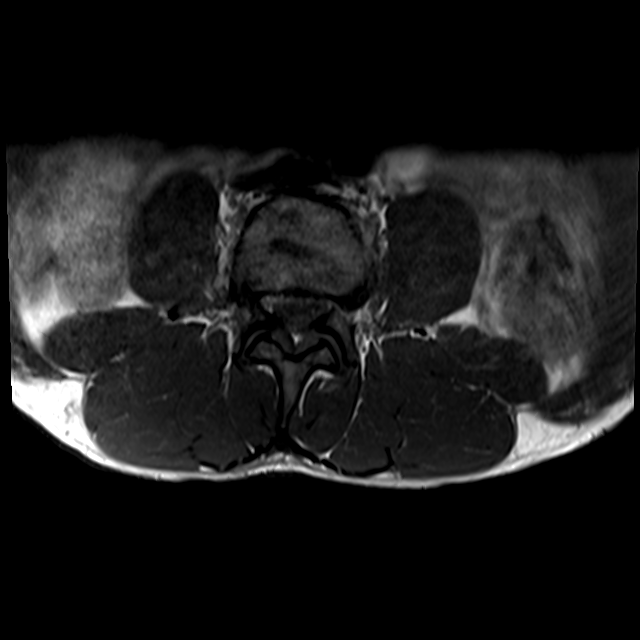
[im 14/32]
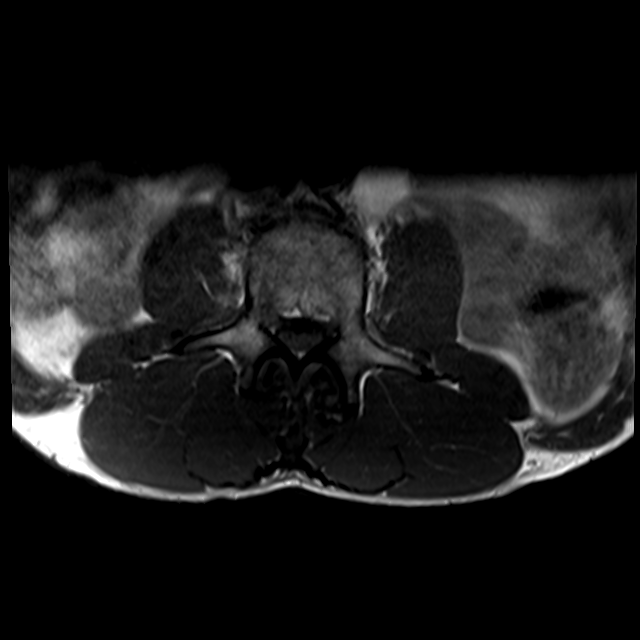
[im 18/32]
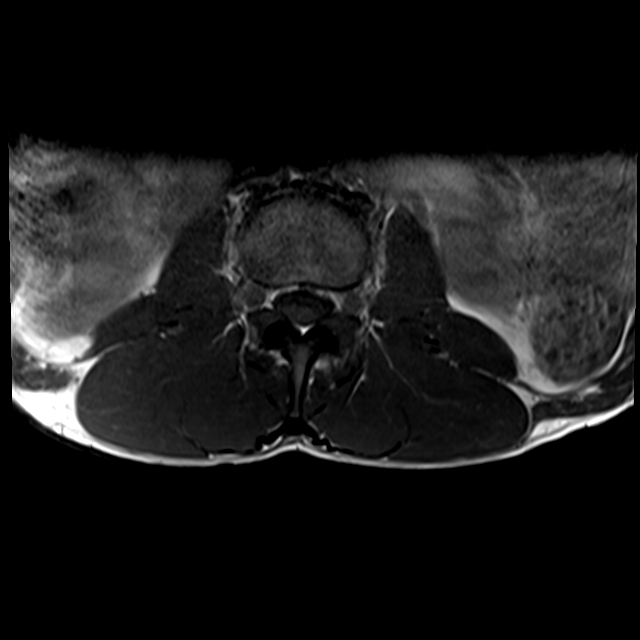
[im 21/32]
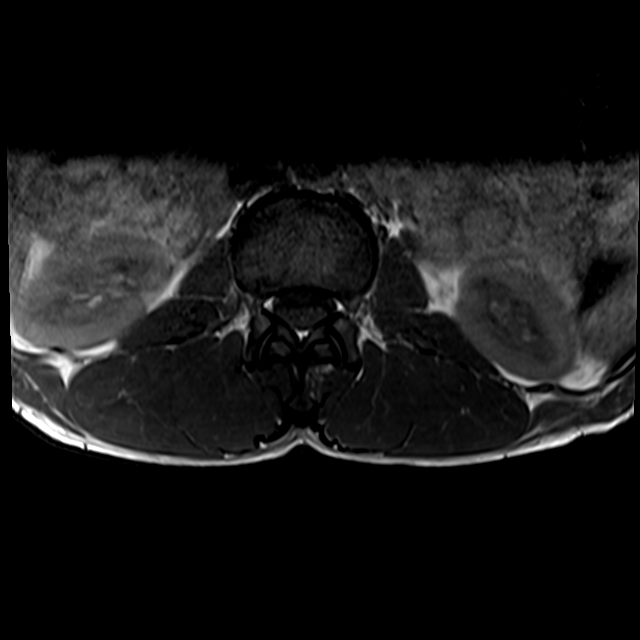
[im 28/32]
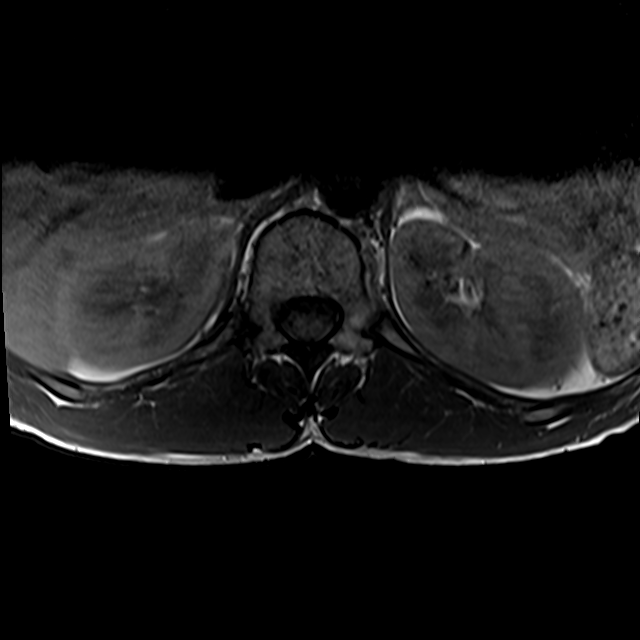

[24 of 48 positions shown; findings below may reference images not displayed]

FINDINGS: Segmentation:  5 lumbar type vertebrae

Alignment: Reversal of lumbar lordosis with mild L2-3
anterolisthesis.

Vertebrae:  No fracture, evidence of discitis, or bone lesion.

Conus medullaris and cauda equina: Conus extends to the L1 level.
Conus and cauda equina appear normal.

Paraspinal and other soft tissues: Dermal inclusion cyst appearance
in the right para median subcutaneous back, located at L1.

Ovoid enhancing nodules in the bilateral gluteus maximus, better
assessed on pelvis MRI performed at the same time.

T2 hyperintensity in the posterior right liver which is a hemangioma
by prior CT.

Disc levels:

T12- L1: Unremarkable.

L1-L2: Unremarkable.

L2-L3: Disc narrowing and bulging. Degenerative facet spurring and
ligamentum flavum thickening. Moderate spinal stenosis. Mild
bilateral foraminal narrowing

L3-L4: Disc narrowing and circumferential bulging with right
foraminal protrusion. Mild facet spurring. Right foraminal
impingement with L3 root flattening on sagittal images. Spinal
stenosis is moderate.

L4-L5: Disc narrowing and bulging. Mild facet spurring. Mild to
moderate right foraminal narrowing. Patent spinal canal

L5-S1:Mild facet spurring.
IMPRESSION: 1. Negative for lumbar spine lesion.
2. Disc and facet degeneration with mild L2-3 anterolisthesis.
3. Moderate spinal stenosis at L2-3 and L3-4.
4. Foraminal impingement on the symptomatic right side at L3-4.
5. Masses in the bilateral gluteus maximus, please correlate with
dedicated pelvis MRI.

## 2020-12-01 IMAGING — MR MR SACRUM / SI JOINTS WO/W CM
7 of 12 series · 28 of 48 positions shown · IV contrast (Gadavist)
Comparison: Prior PET CTs [QP] and [QP].

CLINICAL DATA: Lymphoma.  Possible recurrence.

EXAM:
MRI SACRUM WITHOUT CONTRAST
TECHNIQUE: Multiplanar multi-sequence MR imaging of the sacrum was performed.
No intravenous contrast was administered.

[Series 4: T1 · axial · 4.0mm · 0.43mm/px · z∈[-296,-162]mm · 3 of 32 slices shown (1 of 2)]
[im 1/32]
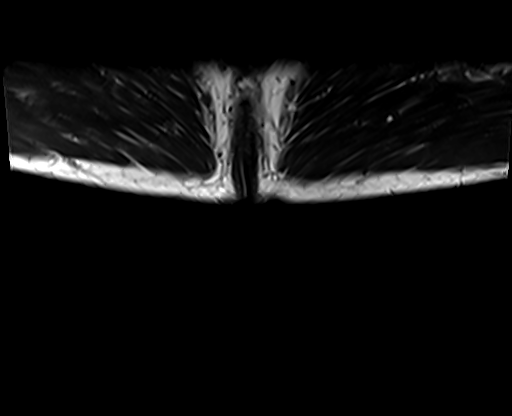
[im 16/32]
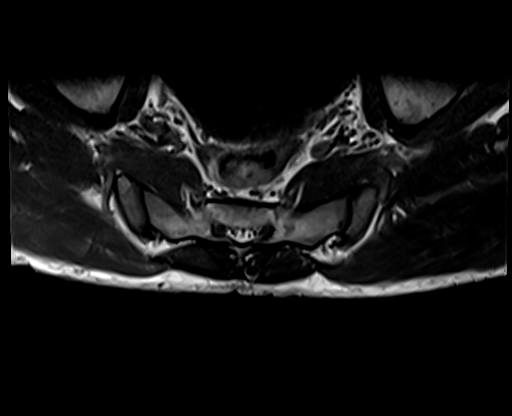
[im 32/32]
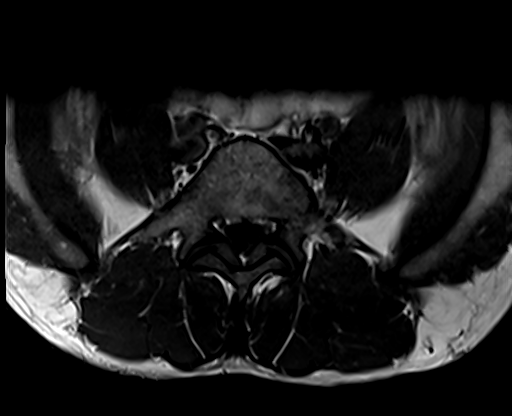

[Series 7: T1 · coronal · 3.0mm · 0.39mm/px · 3 of 25 slices shown (2 of 2)]
[im 1/25]
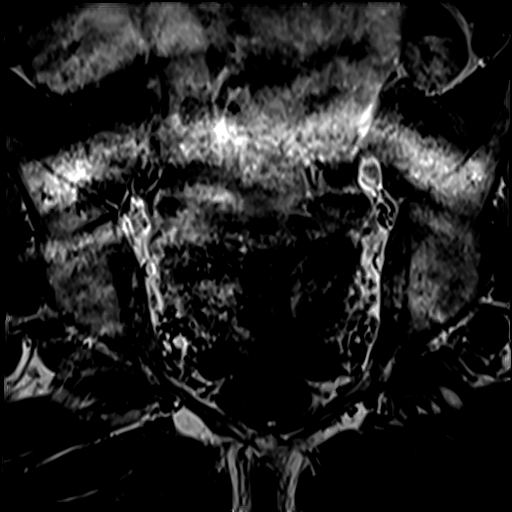
[im 13/25]
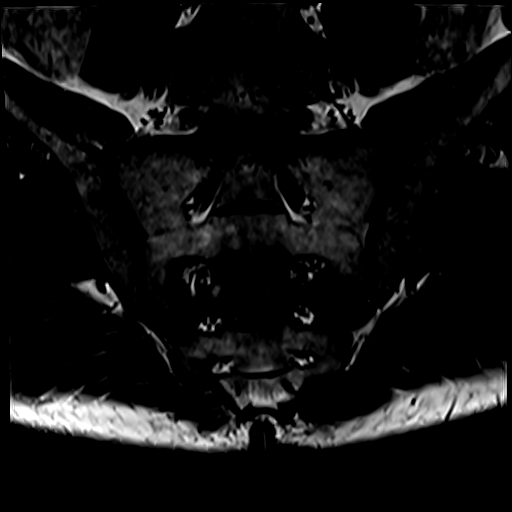
[im 25/25]
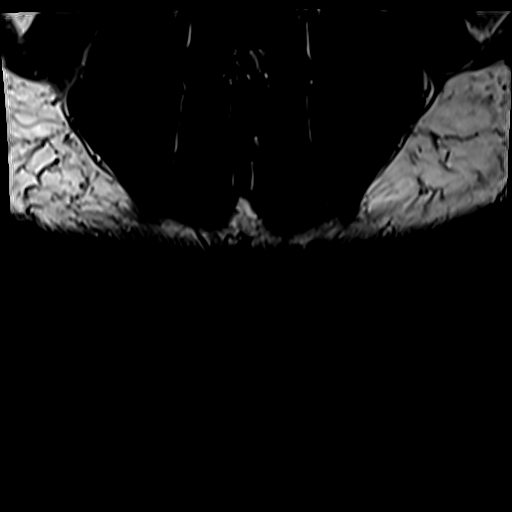

[Series 8: T1 fat-sat · axial · non-contrast · 4.0mm · 0.43mm/px · z∈[-300,-157]mm · 4 of 34 slices shown]
[im 1/34]
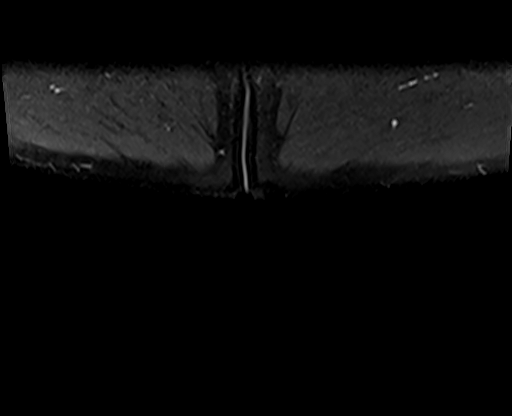
[im 12/34]
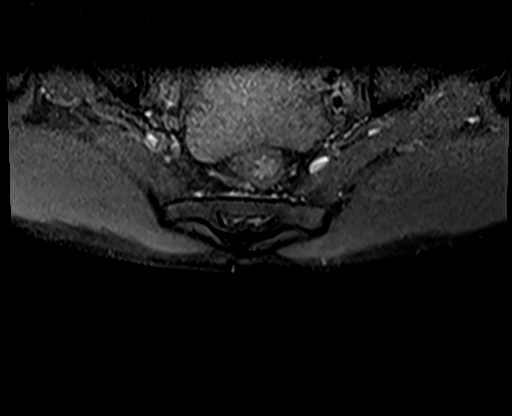
[im 23/34]
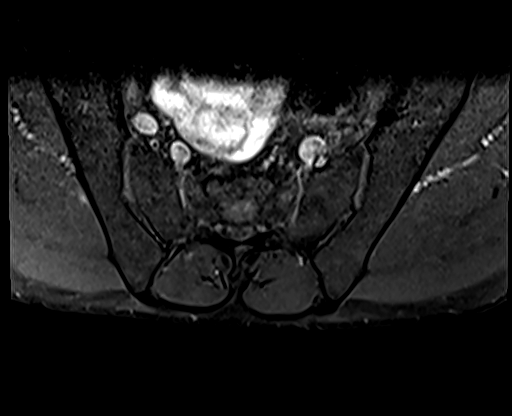
[im 34/34]
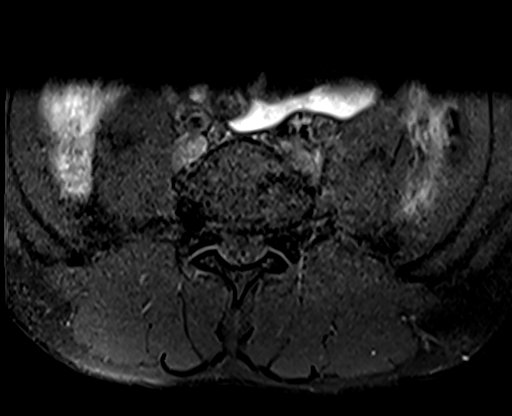

[Series 10: T1 fat-sat post-contrast · axial · non-contrast · 3.5mm · 0.99mm/px · z∈[-298,-75]mm · 6 of 52 slices shown (1 of 4)]
[im 1/52]
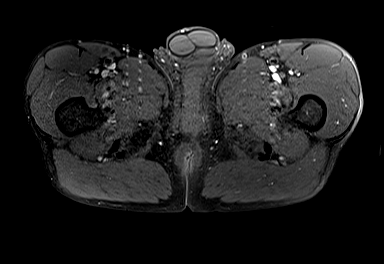
[im 11/52]
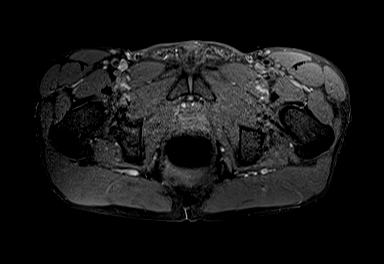
[im 21/52]
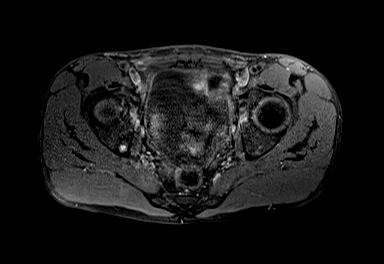
[im 31/52]
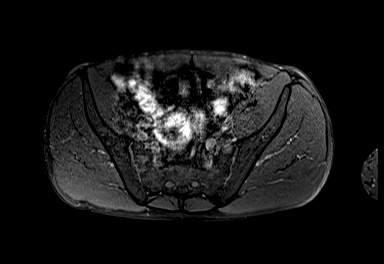
[im 41/52]
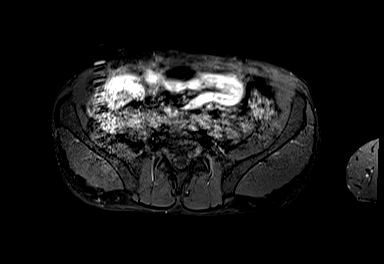
[im 52/52]
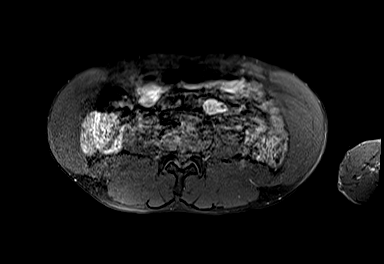

[Series 16: T1 fat-sat post-contrast · axial · 3.5mm · 0.99mm/px · z∈[-508,-285]mm · 6 of 52 slices shown (2 of 4)]
[im 1/52]
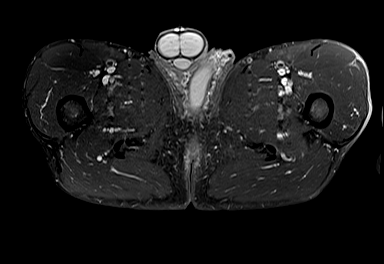
[im 11/52]
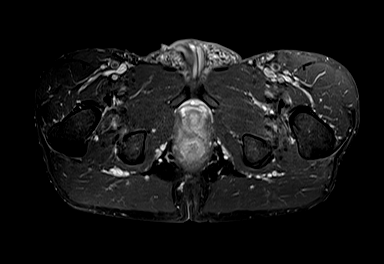
[im 21/52]
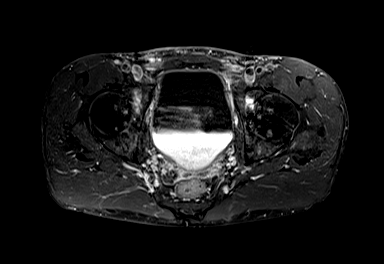
[im 31/52]
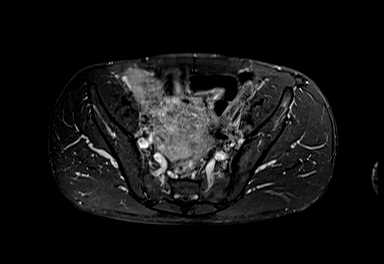
[im 41/52]
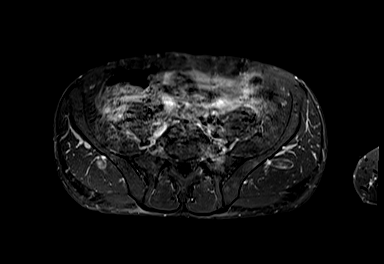
[im 52/52]
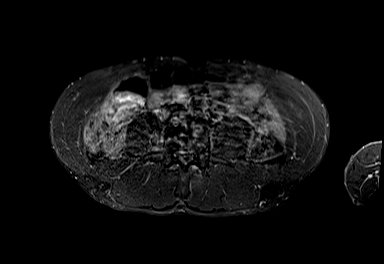

[Series 17: T1 fat-sat post-contrast · axial · 4.0mm · 0.43mm/px · z∈[-516,-373]mm · 4 of 34 slices shown (3 of 4)]
[im 1/34]
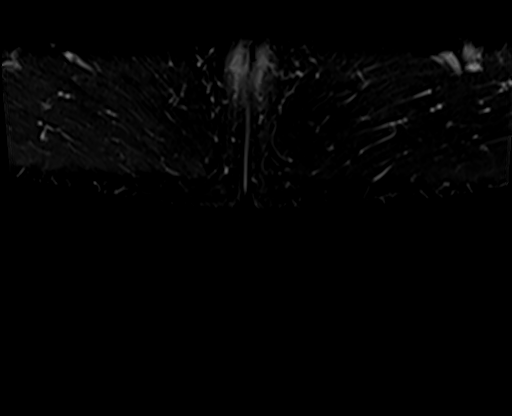
[im 12/34]
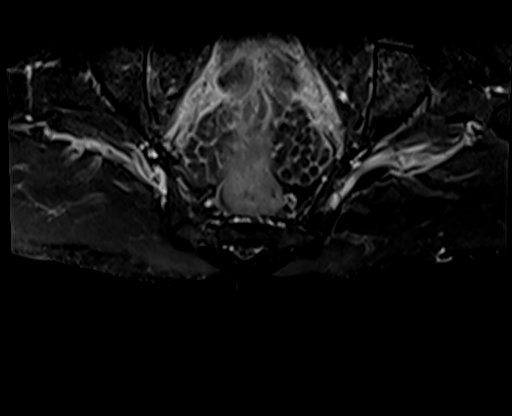
[im 23/34]
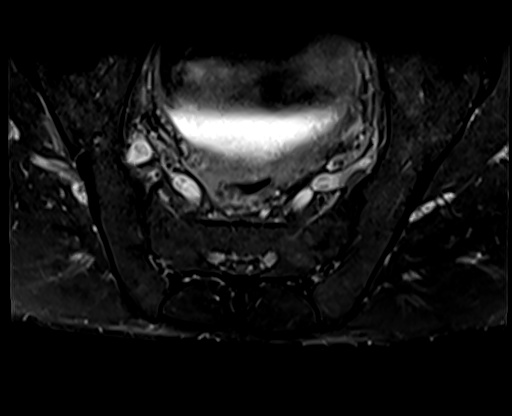
[im 34/34]
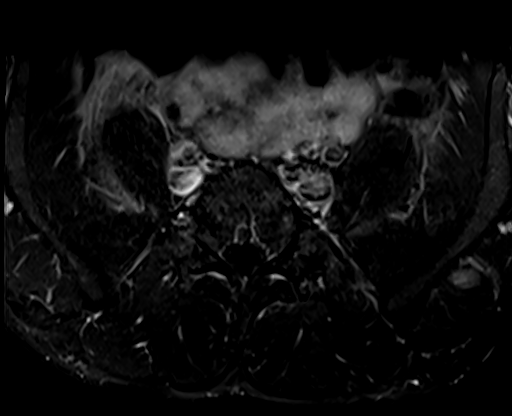

[Series 19: T1 fat-sat post-contrast · oblique · 3.0mm · 0.35mm/px · 2 of 28 slices shown (4 of 4)]
[im 1/28]
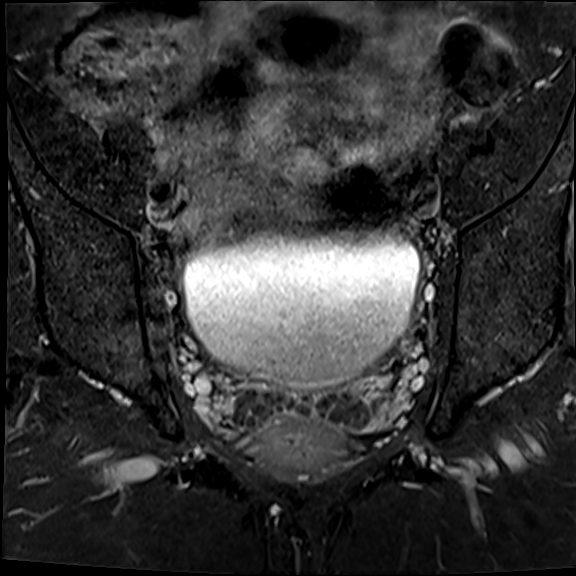
[im 14/28]
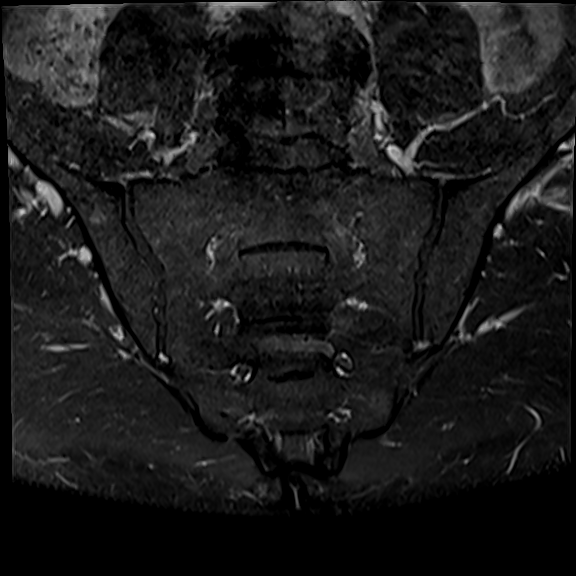

[28 of 48 positions shown; findings below may reference images not displayed]

FINDINGS: Urinary Tract:  The bladder is unremarkable.

Bowel: The rectum, sigmoid colon and visualized small bowel loops
are unremarkable.

Vascular/Lymphatic: The major vascular structures appear normal. No
adenopathy.

Reproductive: The prostate gland and seminal vesicles are
unremarkable.

Other: As demonstrated on the prior MRI lumbar spine there are
lesions in the gluteus maximus muscles bilaterally. In retrospect
the 7 present since the prior CT and PET CTs from [QP]. They
demonstrate low attenuation on the CT scans and high T2 signal
intensity on the MRI. There appears to be some mild rim like
synovial enhancement. These are most likely ganglion cysts. These
were not metabolically active the prior PET CTs.

Musculoskeletal: No significant bony findings. No worrisome
enhancing bone lesions to suggest recurrent osseous lymphoma.
IMPRESSION: 1. No worrisome enhancing bone lesions to suggest recurrent osseous
lymphoma.
2. Bilateral gluteus maximus lesions most likely ganglion cysts.
These are unchanged since [QP] and were not metabolically active on
the prior PET CTs.
3. No pelvic or inguinal lymphadenopathy.

## 2020-12-01 MED ORDER — LIDOCAINE 5 % EX PTCH
1.0000 | MEDICATED_PATCH | CUTANEOUS | 0 refills | Status: DC
Start: 2020-12-01 — End: 2022-03-18

## 2020-12-01 MED ORDER — FENTANYL CITRATE (PF) 100 MCG/2ML IJ SOLN
100.0000 ug | Freq: Once | INTRAMUSCULAR | Status: AC
  Administered 2020-12-01: 100 ug via INTRAVENOUS
  Filled 2020-12-01: qty 2

## 2020-12-01 MED ORDER — METHYLPREDNISOLONE 4 MG PO TBPK: ORAL_TABLET | ORAL | 0 refills | Status: DC

## 2020-12-01 MED ORDER — GADOBUTROL 1 MMOL/ML IV SOLN
6.0000 mL | Freq: Once | INTRAVENOUS | Status: AC | PRN
  Administered 2020-12-01: 6 mL via INTRAVENOUS

## 2020-12-01 MED ORDER — OXYCODONE HCL 5 MG PO TABS: 5.0000 mg | ORAL_TABLET | ORAL | 0 refills | Status: DC | PRN

## 2020-12-01 NOTE — ED Provider Notes (Signed)
[facility] EMER[facility]ENCY DEPARTMENT Provider Note  CSN: [id number] Arrival date & time: 12/01/20 0257    History Chief Complaint  Patient presents with  . Back Pain    HPI  Jesse [name] is a 55 y.o. male with history of Stage IVB non hodgkins lymphoma, diagnosed in 2020, treated with chemo and had been in remission until recently when he noticed a swollen cervical lymph node which was concerning for recurrence on CT scan. He has been seen by Oncology and is scheduled for PET next week for re-staging. In the last week he has had onset of R lower back pain, moderate aching and worse with movement, radiating to his R buttock and R groin area. No falls or injuries. No weakness, numbness or tingling in the leg and no urine or stool incontinence. He has not had any fevers, night sweats or weight loss.    Past Medical History:  Diagnosis Date  . Cancer ([place])   . ETOH abuse   . Lymphadenopathy    left  . Marijuana abuse     Past Surgical History:  Procedure Laterality Date  . HERNIA REPAIR     as child  . IR IMAGING GUIDED PORT INSERTION  05/29/2019  . LYMPH NODE BIOPSY Left 05/07/2019   Procedure: LYMPH NODE BIOPSY;  Surgeon: Melida Quitter, MD;  Location: [facility];  Service: ENT;  Laterality: Left;    Family History  Problem Relation Age of Onset  . Cancer Mother   . Cancer Father   . Cancer Sister     Social History   Tobacco Use  . Smoking status: Former Smoker    Packs/day: 1.00    Years: 20.00    Pack years: 20.00    Quit date: 04/24/1999    Years since quitting: 21.6  . Smokeless tobacco: Never Used  Vaping Use  . Vaping Use: Never used  Substance Use Topics  . Alcohol use: Yes    Comment: 12 beers daily  . Drug use: Yes    Types: Marijuana    Comment: daily     Home Medications Prior to Admission medications   Medication Sig Start Date End Date Taking? Authorizing Provider  acetaminophen (TYLENOL) 500 MG tablet Take 1,000 mg by  mouth every 6 (six) hours as needed. Patient not taking: Reported on 11/18/2020    [provider]  allopurinol (ZYLOPRIM) 100 MG tablet Take by mouth. Patient not taking: Reported on 11/18/2020 05/24/19   [provider]  gabapentin (NEURONTIN) 100 MG capsule TAKE 1 CAPSULE BY MOUTH 3 TIMES DAILY Patient not taking: Reported on 11/18/2020 03/18/20   Heilingoetter, Cassandra L, PA-C  ibuprofen (ADVIL) 800 MG tablet Take 800 mg by mouth every 8 (eight) hours as needed. Patient not taking: No sig reported    [provider]  lidocaine-prilocaine (EMLA) cream Apply 1 application topically as needed. Apply to port site  45 minutes prior to port being accessed Patient not taking: Reported on 11/18/2020 06/12/19   Heilingoetter, Cassandra L, PA-C  prochlorperazine (COMPAZINE) 10 MG tablet Take 1 tablet (10 mg total) by mouth every 8 (eight) hours as needed for nausea or vomiting. Patient not taking: No sig reported 06/12/19   Heilingoetter, Cassandra L, PA-C     Allergies    Patient has no known allergies.   Review of Systems   Review of Systems A comprehensive review of systems was completed and negative except as noted in HPI.    Physical Exam  BP (!) 145/108   Pulse 60   Temp (!) 97.4 F (36.3 C) (Oral)   Resp 13   SpO2 99%   Physical Exam Vitals and nursing note reviewed.  Constitutional:      Appearance: Normal appearance.  HENT:     Head: Normocephalic and atraumatic.     Nose: Nose normal.     Mouth/Throat:     Mouth: Mucous membranes are moist.  Eyes:     Extraocular Movements: Extraocular movements intact.     Conjunctiva/sclera: Conjunctivae normal.  Cardiovascular:     Rate and Rhythm: Normal rate.  Pulmonary:     Effort: Pulmonary effort is normal.     Breath sounds: Normal breath sounds.     Comments: Port in R upper chest Abdominal:     General: Abdomen is flat.     Palpations: Abdomen is soft.     Tenderness: There is no abdominal  tenderness.  Genitourinary:    Comments: No inguinal lymphadenopathy Musculoskeletal:        General: No swelling. Normal range of motion.     Cervical back: Neck supple.     Comments: Tender over the R lumbar paraspinal muscles, R iliac crest and R sciatic notch  Skin:    General: Skin is warm and dry.  Neurological:     General: No focal deficit present.     Mental Status: He is alert.     Sensory: No sensory deficit.     Motor: No weakness.  Psychiatric:        Mood and Affect: Mood normal.      ED Results / Procedures / Treatments   Labs (all labs ordered are listed, but only abnormal results are displayed) Labs Reviewed  COMPREHENSIVE METABOLIC PANEL  CBC WITH DIFFERENTIAL/PLATELET  URINALYSIS, ROUTINE W REFLEX MICROSCOPIC    EKG None  Radiology No results found.  Procedures Procedures  Medications Ordered in the ED Medications - No data to display   MDM Rules/Calculators/A&P MDM Patient with known history of lymphoma, previous PET at diagnosis showed involvement of his spinal bone marrow now with R sided lumbar radicular pain. Will check MRI L/S for evaluation of disc disease or tumor recurrence ED Course  I have reviewed the triage vital signs and the nursing notes.  Pertinent labs & imaging results that were available during my care of the patient were reviewed by me and considered in my medical decision making (see chart for details).  Clinical Course as of 12/01/20 1308  Mon Dec 01, 2020  0436 CBC with mild leukopenia. Similar to previous.  [CS]  1950 CMP is normal.  [CS]  0700 Care of the patient signed out to oncoming team at the change of shift.  [CS]    Clinical Course User Index [CS] Truddie Hidden, MD    Final Clinical Impression(s) / ED Diagnoses Final diagnoses:  None    Rx / DC Orders ED Discharge Orders    None       Truddie Hidden, MD 12/01/20 1309

## 2020-12-01 NOTE — ED Notes (Signed)
Pt transported to MRI 

## 2020-12-01 NOTE — ED Triage Notes (Signed)
Patient reports right low back pain radiating down to right leg onset last week , denies injury or fall , no hematuria or fever .

## 2020-12-04 ENCOUNTER — Ambulatory Visit: Payer: Medicaid Other | Admitting: Internal Medicine

## 2020-12-05 ENCOUNTER — Other Ambulatory Visit: Payer: Medicaid Other

## 2020-12-05 DIAGNOSIS — M48061 Spinal stenosis, lumbar region without neurogenic claudication: Secondary | ICD-10-CM | POA: Diagnosis not present

## 2020-12-05 DIAGNOSIS — R7301 Impaired fasting glucose: Secondary | ICD-10-CM | POA: Diagnosis not present

## 2020-12-05 DIAGNOSIS — G629 Polyneuropathy, unspecified: Secondary | ICD-10-CM | POA: Diagnosis not present

## 2020-12-05 DIAGNOSIS — Z1211 Encounter for screening for malignant neoplasm of colon: Secondary | ICD-10-CM | POA: Diagnosis not present

## 2020-12-05 DIAGNOSIS — Z136 Encounter for screening for cardiovascular disorders: Secondary | ICD-10-CM | POA: Diagnosis not present

## 2020-12-08 ENCOUNTER — Ambulatory Visit: Payer: Medicaid Other | Admitting: Internal Medicine

## 2020-12-10 ENCOUNTER — Other Ambulatory Visit: Payer: Self-pay

## 2020-12-10 ENCOUNTER — Ambulatory Visit (HOSPITAL_COMMUNITY)
Admission: RE | Admit: 2020-12-10 | Discharge: 2020-12-10 | Disposition: A | Discharge status: Home / Self Care | Payer: Medicaid Other | Source: Ambulatory Visit | Attending: Physician Assistant | Admitting: Physician Assistant

## 2020-12-10 DIAGNOSIS — C8111 Nodular sclerosis classical Hodgkin lymphoma, lymph nodes of head, face, and neck: Secondary | ICD-10-CM | POA: Diagnosis not present

## 2020-12-10 DIAGNOSIS — C8190 Hodgkin lymphoma, unspecified, unspecified site: Secondary | ICD-10-CM | POA: Diagnosis not present

## 2020-12-10 LAB — GLUCOSE, CAPILLARY: Glucose-Capillary: 93 mg/dL (ref 70–99)

## 2020-12-10 IMAGING — CT NM PET TUM IMG RESTAG (PS) SKULL BASE T - THIGH
8 series · 25 of 25 positions shown · non-contrast
Comparison: PET-CT scan [DATE], [DATE]

CLINICAL DATA: Subsequent treatment strategy for Hodgkin's
lymphoma.

EXAM:
NUCLEAR MEDICINE PET SKULL BASE TO THIGH
TECHNIQUE: 6.5 mCi F-18 FDG was injected intravenously. Full-ring PET imaging
was performed from the skull base to thigh after the radiotracer. CT
data was obtained and used for attenuation correction and anatomic
localization.
Fasting blood glucose: 93 mg/dl

[Series 3: pet sk_thigh ac · axial · 5.0mm · 4.07mm/px · z∈[-1460,-556]mm · 5 of 227 slices shown]
[im 1/227]
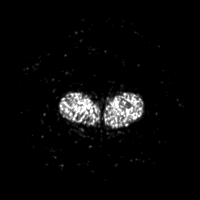
[im 57/227]
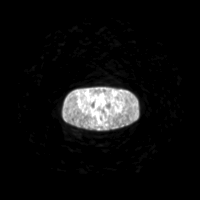
[im 114/227]
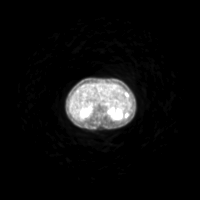
[im 170/227]
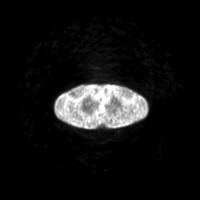
[im 227/227]
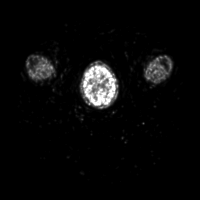

[Series 4: ct sk_thigh 5.0 bf37 · axial · 5.0mm · 0.98mm/px · z∈[-1460,-556]mm · 5 of 226 slices shown]
[im 1/226]
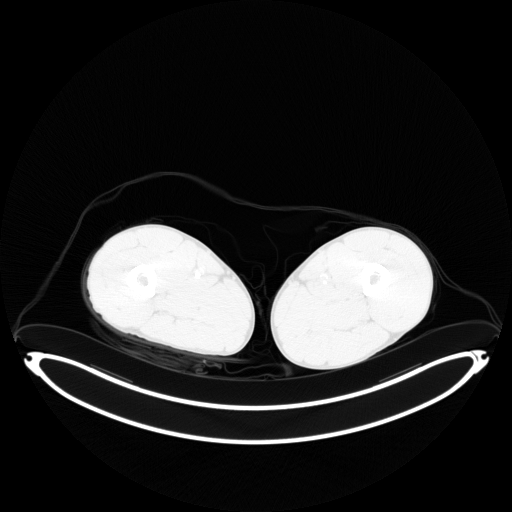
[im 57/226]
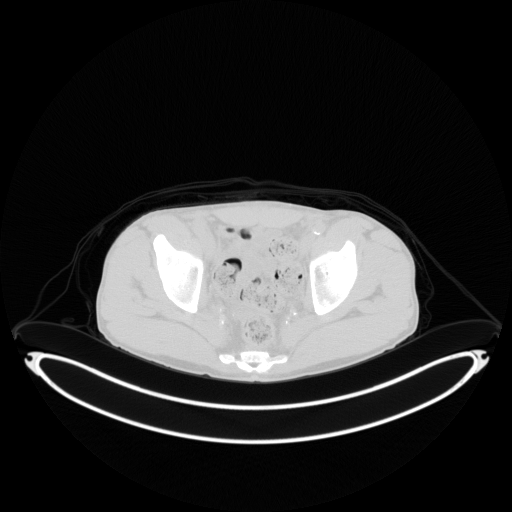
[im 113/226]
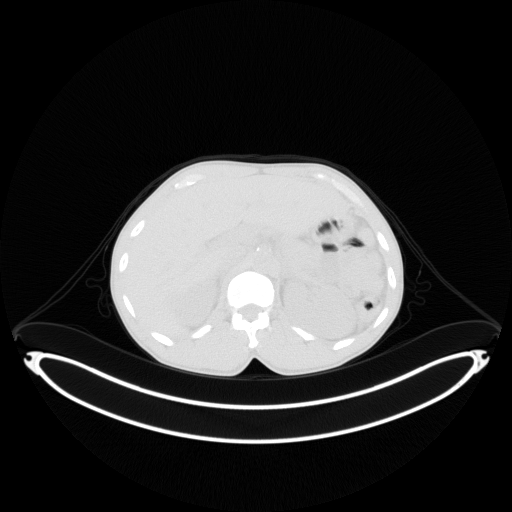
[im 169/226]
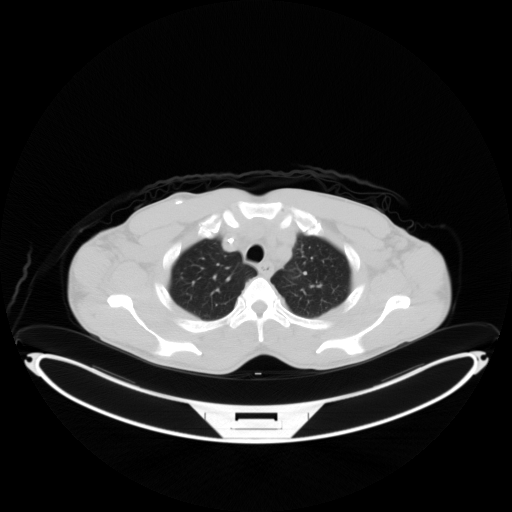
[im 226/226  brain]
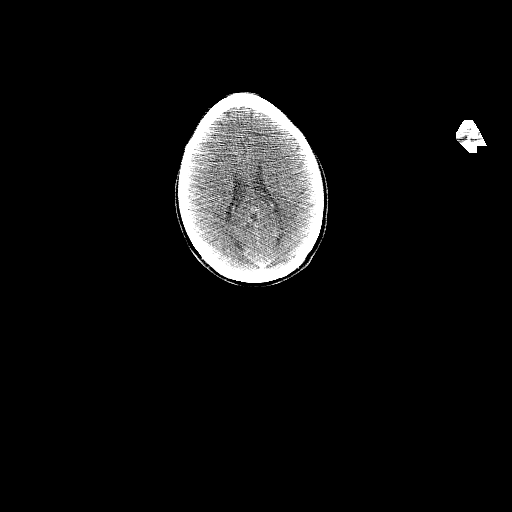

[Series 5: pet sk_thigh nac · axial · 5.0mm · 4.07mm/px · z∈[-1460,-556]mm · 5 of 227 slices shown]
[im 1/227]
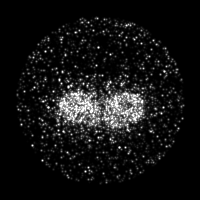
[im 57/227]
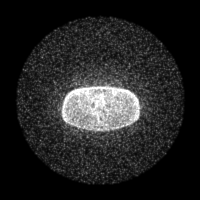
[im 114/227]
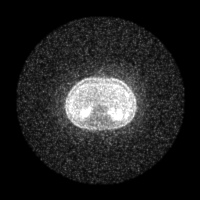
[im 170/227]
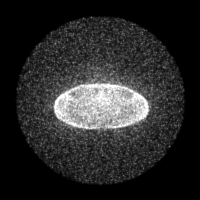
[im 227/227]
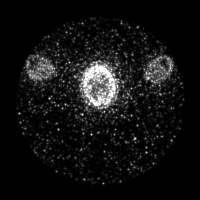

[Series 8: ct sk_thigh 5.0 br59 (id)_bone · axial · 5.0mm · 0.66mm/px · z∈[-980,-724]mm · 2 of 65 slices shown]
[im 1/65]
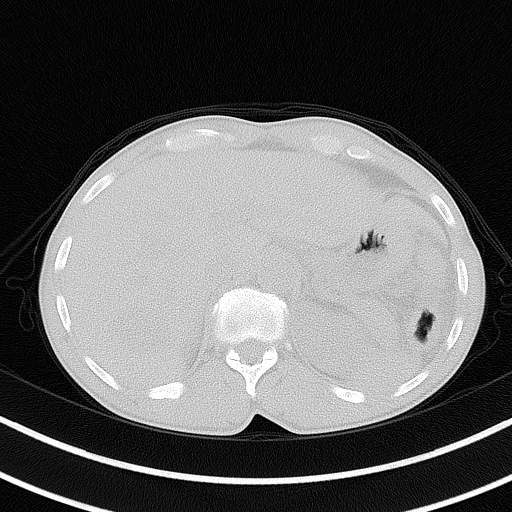
[im 65/65]
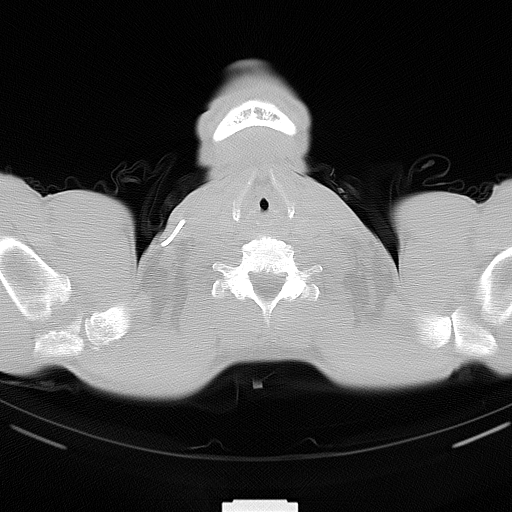

[Series 603: <mip collection> · coronal · 1.88mm/px · 1 of 32 slices shown]
[im 1/32]
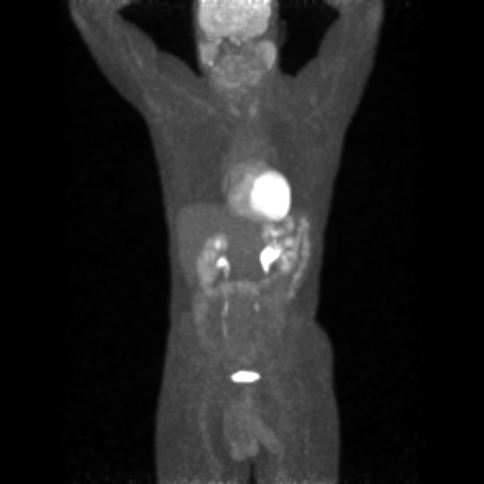

[Series 605: fused cor · 1 of 32 slices shown]
[im 1/32]
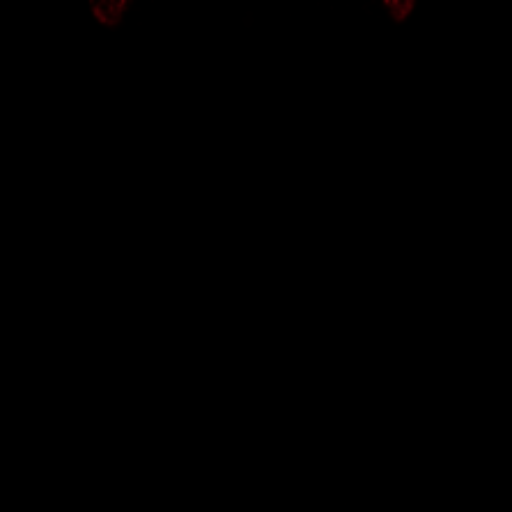

[Series 606: range-ct sk_thigh 5.0 bf37-tra-<alpha range> · 5 of 217 slices shown]
[im 1/217]
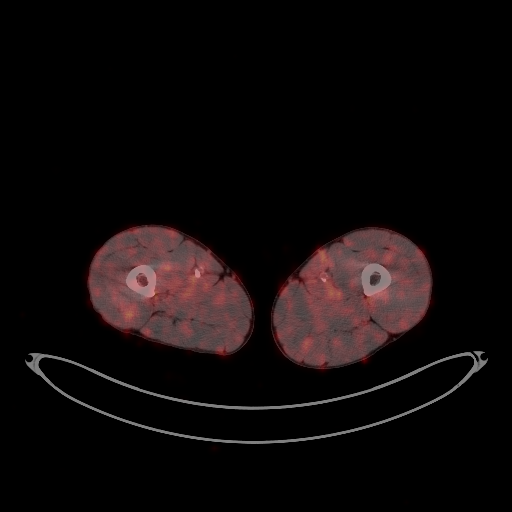
[im 55/217]
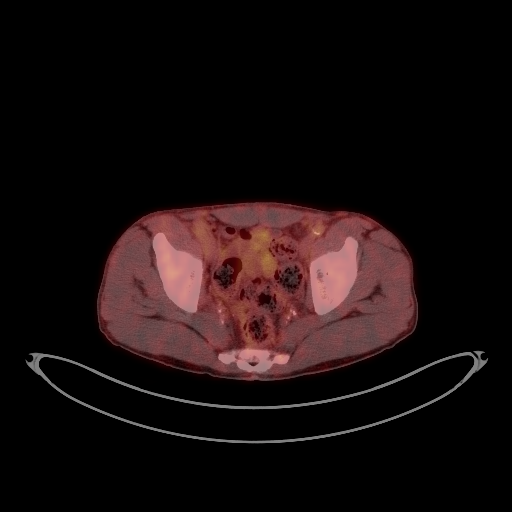
[im 109/217]
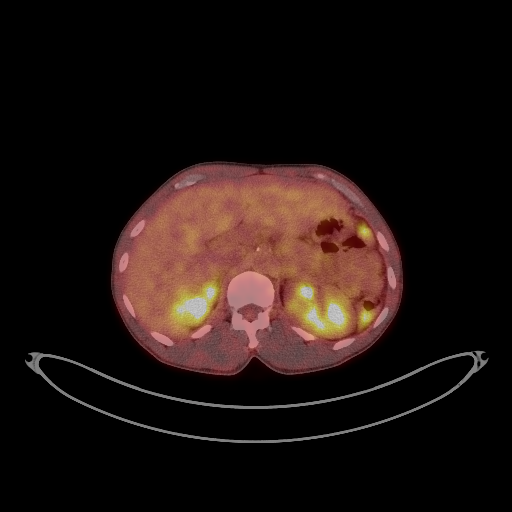
[im 163/217]
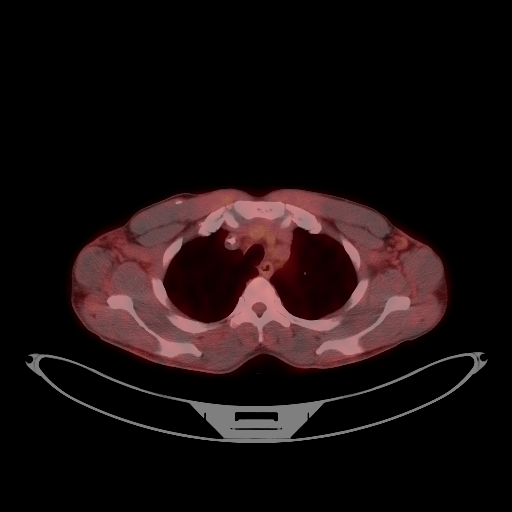
[im 217/217]
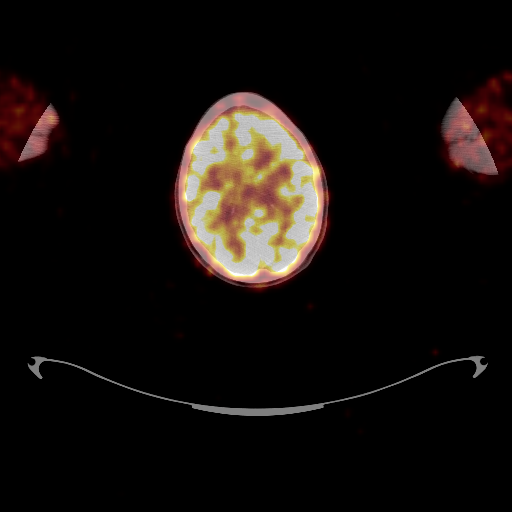

[Series 8732: results mm oncology reading · 1.0mm · 1.00mm/px · 1 of 1 slices shown]
[im 1/1]
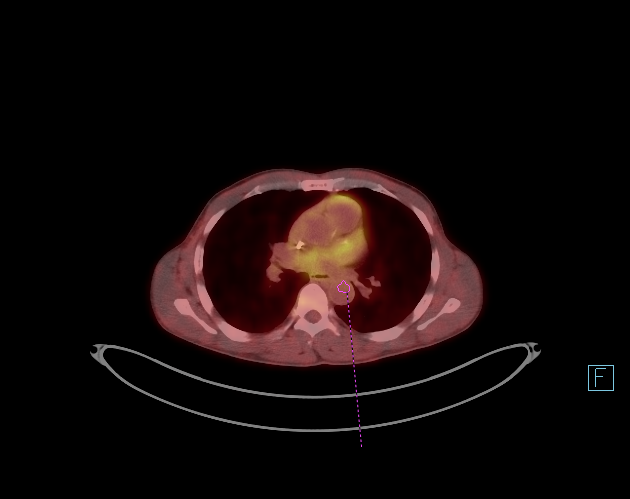

[25 of 25 positions shown; findings below may reference images not displayed]

FINDINGS: Mediastinal blood pool activity: SUV max

Liver activity: SUV max

NECK: No hypermetabolic lymph nodes in the neck.

Incidental CT findings: none

CHEST: No hypermetabolic mediastinal or hilar nodes. No suspicious
pulmonary nodules on the CT scan.

Incidental CT findings: Port in the anterior chest wall with tip in
distal SVC.

ABDOMEN/PELVIS: No abnormal hypermetabolic activity within the
liver, pancreas, adrenal glands, or spleen. No hypermetabolic lymph
nodes in the abdomen or pelvis.

Spleen is normal volume

Incidental CT findings: none

SKELETON: No focal hypermetabolic activity to suggest skeletal
metastasis.

Low-density lesions within the LEFT and RIGHT gluteus maximus muscle
(image 151) measures 2.2 x 1.2 cm similar to 2.1 by 0.7 cm on CT
[DATE]. No significant metabolic activity.

Incidental CT findings: none
IMPRESSION: No evidence of lymphoma recurrence on FDG PET scan.

## 2020-12-10 MED ORDER — FLUDEOXYGLUCOSE F - 18 (FDG) INJECTION
6.0000 | Freq: Once | INTRAVENOUS | Status: AC | PRN
  Administered 2020-12-10: 6.5 via INTRAVENOUS

## 2020-12-11 ENCOUNTER — Telehealth: Payer: Self-pay | Admitting: Physician Assistant

## 2020-12-11 ENCOUNTER — Inpatient Hospital Stay: Payer: Medicaid Other | Admitting: Physician Assistant

## 2020-12-11 NOTE — Telephone Encounter (Signed)
R/s appt per 4/28 sch msg. Pt aware.  

## 2020-12-11 NOTE — Telephone Encounter (Signed)
I called the patient's wife because he missed his appointment today. She said their was some confusion because the robo call reportedly said the appointment was at 12/12/20. I told her we have plenty of openings on Tuesday 12/16/20. She is going to call her husband about his availability and call our office back to rescheduled.

## 2020-12-12 ENCOUNTER — Inpatient Hospital Stay: Payer: Medicaid Other

## 2020-12-15 ENCOUNTER — Ambulatory Visit: Payer: Medicaid Other | Admitting: Internal Medicine

## 2020-12-16 ENCOUNTER — Other Ambulatory Visit: Payer: Self-pay

## 2020-12-16 ENCOUNTER — Inpatient Hospital Stay: Payer: Medicaid Other | Attending: Medical | Admitting: Physician Assistant

## 2020-12-16 ENCOUNTER — Encounter: Payer: Self-pay | Admitting: Physician Assistant

## 2020-12-16 VITALS — BP 134/99 | HR 63 | Temp 97.8°F | Resp 15 | Ht 66.0 in | Wt 133.9 lb

## 2020-12-16 DIAGNOSIS — C8110 Nodular sclerosis classical Hodgkin lymphoma, unspecified site: Secondary | ICD-10-CM | POA: Insufficient documentation

## 2020-12-16 DIAGNOSIS — Z7952 Long term (current) use of systemic steroids: Secondary | ICD-10-CM | POA: Diagnosis not present

## 2020-12-16 DIAGNOSIS — Z452 Encounter for adjustment and management of vascular access device: Secondary | ICD-10-CM | POA: Insufficient documentation

## 2020-12-16 DIAGNOSIS — Z79899 Other long term (current) drug therapy: Secondary | ICD-10-CM | POA: Insufficient documentation

## 2020-12-16 DIAGNOSIS — C8111 Nodular sclerosis classical Hodgkin lymphoma, lymph nodes of head, face, and neck: Secondary | ICD-10-CM

## 2020-12-16 DIAGNOSIS — M48061 Spinal stenosis, lumbar region without neurogenic claudication: Secondary | ICD-10-CM | POA: Diagnosis not present

## 2020-12-16 DIAGNOSIS — M4316 Spondylolisthesis, lumbar region: Secondary | ICD-10-CM | POA: Insufficient documentation

## 2020-12-16 DIAGNOSIS — R590 Localized enlarged lymph nodes: Secondary | ICD-10-CM | POA: Diagnosis not present

## 2020-12-16 DIAGNOSIS — Z95828 Presence of other vascular implants and grafts: Secondary | ICD-10-CM

## 2020-12-16 DIAGNOSIS — Z9221 Personal history of antineoplastic chemotherapy: Secondary | ICD-10-CM | POA: Diagnosis not present

## 2020-12-16 MED ORDER — HEPARIN SOD (PORK) LOCK FLUSH 100 UNIT/ML IV SOLN
500.0000 [IU] | Freq: Once | INTRAVENOUS | Status: AC | PRN
  Administered 2020-12-16: 500 [IU]
  Filled 2020-12-16: qty 5

## 2020-12-16 MED ORDER — SODIUM CHLORIDE 0.9% FLUSH
10.0000 mL | INTRAVENOUS | Status: DC | PRN
  Administered 2020-12-16: 10 mL
  Filled 2020-12-16: qty 10

## 2020-12-16 NOTE — Progress Notes (Addendum)
[facility] OFFICE PROGRESS NOTE  Patient, No Pcp Per (Inactive) No address on file  DIAGNOSIS: Stage IVB Hodgkin's Lymphoma, nodular sclerosis subtype. He presented with a bulky left cervical and supraclavicular lymphadenopathy as well as involvement of the spleen, nodal disease in the porta hepatis, and extensive disease involving the marrow of the pelvis, spine, and ribs. He was diagnosed in September 2020.    IPS score: 4   PRIOR THERAPY: Systemic chemotherapy with brentuximab vedotin 1.2 mg/kg, doxorubicin 25 mg/m2, vinblastine 6 mg/m2, dacarbazine 375 mg/m2 on days 1 and 15 every 28 days. First dose expected on 05/30/2019.   Status post 6 cycles.   CURRENT THERAPY: Observation  INTERVAL HISTORY: Allenmichael Mcpartlin Turano 55 y.o. male returns to the clinic today for a follow-up visit.  The patient was last seen in the clinic on 11/18/2020.  The patient was recently found to have a newly enlarged right cervical lymph node which is concerning for disease recurrence.  Therefore, a PET scan was ordered to further evaluate him for disease recurrence.  This PET scan was performed on 12/10/2020.  In the interval since his last appointment, the patient presented to the emergency room on 12/01/2020 for the chief complaint of back pain which was new.  The patient had 1 week worth of right lower back pain that radiated to his right buttock and right groin area.  Due to his history of lymphoma and possible disease recurrence and prior marrow involvement in the pelvis, spine, and ribs, the patient had an MRI performed of the sacrum and lumbar spine.  No worrisome bone lesions to suggest recurrent osseous lymphoma was found. There was bilateral gluteus maximus lesions likely to represent ganglion cyst.  Otherwise, there was some spinal stenosis in the lumbar region.   Otherwise, he denies fevers, chills, night sweats, or weight loss. Denies any signs or symptoms infection except for his dental  concerns with the right upper back teeth. This was pulled a few weeks ago and he completed antibiotics. Denies early satiety or abdominal fullness. Denies bleeding or bruising. Denies other lymphadenopathy. He is here for evaluation and to review his scan results and for a more detailed discussion about his current condition and recommended treatment options.   MEDICAL HISTORY: Past Medical History:  Diagnosis Date  . Cancer ([place])   . ETOH abuse   . Lymphadenopathy    left  . Marijuana abuse     ALLERGIES:  has No Known Allergies.  MEDICATIONS:  Current Outpatient Medications  Medication Sig Dispense Refill  . gabapentin (NEURONTIN) 100 MG capsule TAKE 1 CAPSULE BY MOUTH 3 TIMES DAILY (Patient not taking: No sig reported) 90 capsule 2  . lidocaine (LIDODERM) 5 % Place 1 patch onto the skin daily. Remove & Discard patch within 12 hours or as directed by MD 30 patch 0  . lidocaine-prilocaine (EMLA) cream Apply 1 application topically as needed. Apply to port site  45 minutes prior to port being accessed (Patient not taking: No sig reported) 30 g 1  . methylPREDNISolone (MEDROL DOSEPAK) 4 MG TBPK tablet Per packet instructions 21 each 0  . naproxen sodium (ALEVE) 220 MG tablet Take 660 mg by mouth daily as needed (pain).    Marland Kitchen oxyCODONE (ROXICODONE) 5 MG immediate release tablet Take 1 tablet (5 mg total) by mouth every 4 (four) hours as needed for severe pain. 8 tablet 0  . prochlorperazine (COMPAZINE) 10 MG tablet Take 1 tablet (10 mg total) by mouth every 8 (  eight) hours as needed for nausea or vomiting. (Patient not taking: No sig reported) 30 tablet 2   Current Facility-Administered Medications  Medication Dose Route Frequency Provider Last Rate Last Admin  . sodium chloride flush (NS) 0.9 % injection 10 mL  10 mL Intracatheter PRN Curt Bears, MD   10 mL at 12/16/20 0859    SURGICAL HISTORY:  Past Surgical History:  Procedure Laterality Date  . HERNIA REPAIR     as child  .  IR IMAGING GUIDED PORT INSERTION  05/29/2019  . LYMPH NODE BIOPSY Left 05/07/2019   Procedure: LYMPH NODE BIOPSY;  Surgeon: Melida Quitter, MD;  Location: [facility];  Service: ENT;  Laterality: Left;    REVIEW OF SYSTEMS:   Review of Systems  Constitutional: Negative for appetite change, chills, fatigue, fever and unexpected weight change.  HENT: Negative for mouth sores, nosebleeds, sore throat and trouble swallowing.   Eyes: Negative for eye problems and icterus.  Respiratory: Negative for cough, hemoptysis, shortness of breath and wheezing.   Cardiovascular: Negative for chest pain and leg swelling.  Gastrointestinal: Negative for abdominal pain, constipation, diarrhea, nausea and vomiting.  Genitourinary: Negative for bladder incontinence, difficulty urinating, dysuria, frequency and hematuria.   Musculoskeletal: Negative for back pain, gait problem, neck pain and neck stiffness.  Skin: Negative for itching and rash.  Neurological: Negative for dizziness, extremity weakness, gait problem, headaches, light-headedness and seizures.  Hematological: Positive for right cervical lymph node enlargement. Does not bruise/bleed easily.  Psychiatric/Behavioral: Negative for confusion, depression and sleep disturbance. The patient is not nervous/anxious.    PHYSICAL EXAMINATION:  Blood pressure (!) 134/99, pulse 63, temperature 97.8 F (36.6 C), temperature source Oral, resp. rate 15, height 5\' 6"  (1.676 m), weight 133 lb 14.4 oz (60.7 kg), SpO2 100 %.  ECOG PERFORMANCE STATUS: 0 - Asymptomatic  Physical Exam  Constitutional: Oriented to person, place, and time and well-developed, well-nourished, and in no distress.  HENT:  Head: Normocephalic and atraumatic.  Mouth/Throat: Oropharynx is clear and moist. No oropharyngeal exudate.  Eyes: Conjunctivae are normal. Right eye exhibits no discharge. Left eye exhibits no discharge. No scleral icterus.  Neck: Normal range of motion.  Neck supple.  Cardiovascular: Normal rate, regular rhythm, normal heart sounds and intact distal pulses.   Pulmonary/Chest: Effort normal and breath sounds normal. No respiratory distress. No wheezes. No rales.  Abdominal: Soft. Bowel sounds are normal. Exhibits no distension and no mass. There is no tenderness.  Musculoskeletal: Normal range of motion. Exhibits no edema.  Lymphadenopathy:    Positive for right cervical lymph node enlargement. Mobile and non-tender.  Neurological: Alert and oriented to person, place, and time. Exhibits normal muscle tone. Gait normal. Coordination normal.  Skin: Skin is warm and dry. No rash noted. Not diaphoretic. No erythema. No pallor.  Psychiatric: Mood, memory and judgment normal.  Vitals reviewed.  LABORATORY DATA: Lab Results  Component Value Date   WBC 2.8 (L) 12/01/2020   HGB 12.6 (L) 12/01/2020   HCT 38.1 (L) 12/01/2020   MCV 94.8 12/01/2020   PLT 269 12/01/2020      Chemistry      Component Value Date/Time   NA 135 12/01/2020 0320   K 3.8 12/01/2020 0320   CL 106 12/01/2020 0320   CO2 24 12/01/2020 0320   BUN 13 12/01/2020 0320   CREATININE 0.79 12/01/2020 0320   CREATININE 0.91 11/18/2020 0934      Component Value Date/Time   CALCIUM 8.9 12/01/2020 0320  ALKPHOS 69 12/01/2020 0320   AST 22 12/01/2020 0320   AST 17 11/18/2020 0934   ALT 19 12/01/2020 0320   ALT 21 11/18/2020 0934   BILITOT 0.8 12/01/2020 0320   BILITOT 0.5 11/18/2020 0934       RADIOGRAPHIC STUDIES:  MR Lumbar Spine W Wo Contrast  Result Date: 12/01/2020 CLINICAL DATA:  Bone neoplasm with recurrence suspected. History of lymphoma in 2020. Patient reports low back pain radiating down the right leg and beginning last week EXAM: MRI LUMBAR SPINE WITHOUT AND WITH CONTRAST TECHNIQUE: Multiplanar and multiecho pulse sequences of the lumbar spine were obtained without and with intravenous contrast. CONTRAST:  80mL GADAVIST GADOBUTROL 1 MMOL/ML IV SOLN COMPARISON:   Abdominal CT 06/03/2020 FINDINGS: Segmentation:  5 lumbar type vertebrae Alignment: Reversal of lumbar lordosis with mild L2-3 anterolisthesis. Vertebrae:  No fracture, evidence of discitis, or bone lesion. Conus medullaris and cauda equina: Conus extends to the L1 level. Conus and cauda equina appear normal. Paraspinal and other soft tissues: Dermal inclusion cyst appearance in the right para median subcutaneous back, located at L1. Ovoid enhancing nodules in the bilateral gluteus maximus, better assessed on pelvis MRI performed at the same time. T2 hyperintensity in the posterior right liver which is a hemangioma by prior CT. Disc levels: T12- L1: Unremarkable. L1-L2: Unremarkable. L2-L3: Disc narrowing and bulging. Degenerative facet spurring and ligamentum flavum thickening. Moderate spinal stenosis. Mild bilateral foraminal narrowing L3-L4: Disc narrowing and circumferential bulging with right foraminal protrusion. Mild facet spurring. Right foraminal impingement with L3 root flattening on sagittal images. Spinal stenosis is moderate. L4-L5: Disc narrowing and bulging. Mild facet spurring. Mild to moderate right foraminal narrowing. Patent spinal canal L5-S1:Mild facet spurring. IMPRESSION: 1. Negative for lumbar spine lesion. 2. Disc and facet degeneration with mild L2-3 anterolisthesis. 3. Moderate spinal stenosis at L2-3 and L3-4. 4. Foraminal impingement on the symptomatic right side at L3-4. 5. Masses in the bilateral gluteus maximus, please correlate with dedicated pelvis MRI. Electronically Signed   By: Monte Fantasia M.D.   On: 12/01/2020 07:14   MR SACRUM SI JOINTS W WO CONTRAST  Result Date: 12/01/2020 CLINICAL DATA:  Lymphoma.  Possible recurrence. EXAM: MRI SACRUM WITHOUT CONTRAST TECHNIQUE: Multiplanar multi-sequence MR imaging of the sacrum was performed. No intravenous contrast was administered. COMPARISON:  Prior PET CTs 2021 and 2020. FINDINGS: Urinary Tract:  The bladder is unremarkable.  Bowel: The rectum, sigmoid colon and visualized small bowel loops are unremarkable. Vascular/Lymphatic: The major vascular structures appear normal. No adenopathy. Reproductive: The prostate gland and seminal vesicles are unremarkable. Other: As demonstrated on the prior MRI lumbar spine there are lesions in the gluteus maximus muscles bilaterally. In retrospect the 7 present since the prior CT and PET CTs from 2020. They demonstrate low attenuation on the CT scans and high T2 signal intensity on the MRI. There appears to be some mild rim like synovial enhancement. These are most likely ganglion cysts. These were not metabolically active the prior PET CTs. Musculoskeletal: No significant bony findings. No worrisome enhancing bone lesions to suggest recurrent osseous lymphoma. IMPRESSION: 1. No worrisome enhancing bone lesions to suggest recurrent osseous lymphoma. 2. Bilateral gluteus maximus lesions most likely ganglion cysts. These are unchanged since 2020 and were not metabolically active on the prior PET CTs. 3. No pelvic or inguinal lymphadenopathy. Electronically Signed   By: Marijo Sanes M.D.   On: 12/01/2020 08:20   NM PET Image Restag (PS) Skull Base To Thigh  Result Date: 12/10/2020  CLINICAL DATA:  Subsequent treatment strategy for Hodgkin's lymphoma. EXAM: NUCLEAR MEDICINE PET SKULL BASE TO THIGH TECHNIQUE: 6.5 mCi F-18 FDG was injected intravenously. Full-ring PET imaging was performed from the skull base to thigh after the radiotracer. CT data was obtained and used for attenuation correction and anatomic localization. Fasting blood glucose: 93 mg/dl COMPARISON:  PET-CT scan 11/16/2019, 08/13/2019 FINDINGS: Mediastinal blood pool activity: SUV max 2.1 Liver activity: SUV max 3.6 NECK: No hypermetabolic lymph nodes in the neck. Incidental CT findings: none CHEST: No hypermetabolic mediastinal or hilar nodes. No suspicious pulmonary nodules on the CT scan. Incidental CT findings: Port in the anterior  chest wall with tip in distal SVC. ABDOMEN/PELVIS: No abnormal hypermetabolic activity within the liver, pancreas, adrenal glands, or spleen. No hypermetabolic lymph nodes in the abdomen or pelvis. Spleen is normal volume Incidental CT findings: none SKELETON: No focal hypermetabolic activity to suggest skeletal metastasis. Low-density lesions within the LEFT and RIGHT gluteus maximus muscle (image 151) measures 2.2 x 1.2 cm similar to 2.1 by 0.7 cm on CT 04/18/2019. No significant metabolic activity. Incidental CT findings: none IMPRESSION: No evidence of lymphoma recurrence on FDG PET scan. Electronically Signed   By: Suzy Bouchard M.D.   On: 12/10/2020 11:15     ASSESSMENT/PLAN:  This is a very pleasant 55 years old African-American male diagnosed with a stage IV Hodgkin lymphoma, nodular sclerosing subtype. He presented with a bulky left cervical and supraclavicular lymphadenopathy as well as involvement of the spleen, nodal disease in the porta hepatis, and extensive disease involving the marrow of the pelvis, spine, and ribs. He was diagnosed in September 2020.   He underwent systemic chemotherapy with brentuximab Vedotin in addition to doxorubicin, vinblastine and dacarbazine.  Status post 6 cycles. His last dose was in October 2021.   His last PET scan after the treatment showed almost complete resolution of his disease with no hypermetabolic activity in the neck, chest, abdomen pelvis or the osseous structures.  He has Deauville1. The patient is currently on observation and he is feeling fine today with no concerning complaints.  In February 2022, the patient noted a new non-tender right cervical lymph node. He recently had a CT chest and neck to further evaluate this. The scan showed interval development of necrotic lymph node in the right mid neck measuring 17 mm. This is most compatible with recurrent tumor.   The patient recently had a PET scan performed.  Dr. Julien Nordmann personally and  independently reviewed the scan results and discussed the results with the patient today.  The scan showed no No evidence of lymphoma recurrence on FDG PET scan.  Dr. Julien Nordmann recommends that we continue on observation with a restaging CT of the neck, chest, and abdomen/pelvis in 6 months  I will arrange for the patient to have his port-a-cath flushed today and again every 6 weeks.   We will see the patient back for follow-up visit in 6 months weeks for evaluation to review the CT scan results.   The patient was advised to call immediately if he has any concerning symptoms in the interval. The patient voices understanding of current disease status and treatment options and is in agreement with the current care plan. All questions were answered. The patient knows to call the clinic with any problems, questions or concerns. We can certainly see the patient much sooner if necessary    Orders Placed This Encounter  Procedures  . CT Chest W Contrast    Standing Status:  Future    Standing Expiration Date:   12/16/2021    Order Specific Question:   If indicated for the ordered procedure, I authorize the administration of contrast media per Radiology protocol    Answer:   Yes    Order Specific Question:   Preferred imaging location?    Answer:   [facility]  . CT Abdomen Pelvis W Contrast    Standing Status:   Future    Standing Expiration Date:   12/16/2021    Order Specific Question:   If indicated for the ordered procedure, I authorize the administration of contrast media per Radiology protocol    Answer:   Yes    Order Specific Question:   Preferred imaging location?    Answer:   [facility]    Order Specific Question:   Is Oral Contrast requested for this exam?    Answer:   Yes, Per Radiology protocol  . CT Soft Tissue Neck W Contrast    Standing Status:   Future    Standing Expiration Date:   12/16/2021    Order Specific Question:   If indicated for the ordered procedure,  I authorize the administration of contrast media per Radiology protocol    Answer:   Yes    Order Specific Question:   Preferred imaging location?    Answer:   [facility]  . CBC with Differential ([place] Only)    Standing Status:   Future    Standing Expiration Date:   12/16/2021  . CMP ([place] only)    Standing Status:   Future    Standing Expiration Date:   12/16/2021  . Lactate dehydrogenase (LDH)    Standing Status:   Future    Standing Expiration Date:   12/16/2021      Tobe Sos Elle Vezina, PA-C 12/16/20  ADDENDUM: Hematology/Oncology Attending: I had a face-to-face encounter with the patient today.  I reviewed his record, scans and recommended his care plan.  This is a very pleasant 55 years old African-American male with a stage IV Hodgkin's lymphoma, nodular sclerosing subtype presented with bulky left cervical and supraclavicular lymphadenopathy as well as involvement of the spleen, nodal disease in the porta hepatis as well as extensive disease involving the marrow of the pelvis, spine and ribs diagnosed in September 2020.  The patient is status post systemic chemotherapy with brentuximab vedotin in addition to doxorubicin, vinblastine and dacarbazine for 6 cycles completed in March 2022 with almost complete response and he has been in observation since that time. Most recently he was found to have right neck lymphadenopathy and CT scan of the neck was suspicious for necrotic lymph node in that area.  The patient was treated with a course of antibiotics and there was improvement in the size of this lymph node. He had repeat PET scan performed recently.  I personally and independently reviewed the scans and discussed the results with the patient today. His scan showed no evidence of lymphoma recurrence. I recommended for the patient to continue on observation with repeat CT scan of the neck, chest, abdomen pelvis in 6 months. He was advised to call  immediately if he has any other concerning symptoms in the interval. The total time spent in the appointment was 30 minutes.  Disclaimer: This note was dictated with voice recognition software. Similar sounding words can inadvertently be transcribed and may be missed upon review. Eilleen Kempf, MD 12/16/20

## 2020-12-17 DIAGNOSIS — G629 Polyneuropathy, unspecified: Secondary | ICD-10-CM | POA: Diagnosis not present

## 2020-12-17 DIAGNOSIS — M48061 Spinal stenosis, lumbar region without neurogenic claudication: Secondary | ICD-10-CM | POA: Diagnosis not present

## 2020-12-17 DIAGNOSIS — E785 Hyperlipidemia, unspecified: Secondary | ICD-10-CM | POA: Diagnosis not present

## 2021-01-23 ENCOUNTER — Telehealth: Payer: Self-pay | Admitting: Medical Oncology

## 2021-01-23 ENCOUNTER — Telehealth: Payer: Self-pay | Admitting: Physician Assistant

## 2021-01-23 NOTE — Telephone Encounter (Signed)
R/s per 6/10 sch msg, pt wife is aware.

## 2021-01-23 NOTE — Telephone Encounter (Signed)
Schedule conflict with port flush on 01/27/21. Schedule message sent.

## 2021-01-27 ENCOUNTER — Inpatient Hospital Stay: Payer: Medicaid Other

## 2021-01-28 ENCOUNTER — Other Ambulatory Visit: Payer: Self-pay

## 2021-01-28 ENCOUNTER — Inpatient Hospital Stay: Payer: Medicaid Other | Attending: Medical

## 2021-01-28 DIAGNOSIS — Z452 Encounter for adjustment and management of vascular access device: Secondary | ICD-10-CM | POA: Diagnosis not present

## 2021-01-28 DIAGNOSIS — Z95828 Presence of other vascular implants and grafts: Secondary | ICD-10-CM

## 2021-01-28 DIAGNOSIS — C8111 Nodular sclerosis classical Hodgkin lymphoma, lymph nodes of head, face, and neck: Secondary | ICD-10-CM | POA: Diagnosis not present

## 2021-01-28 MED ORDER — HEPARIN SOD (PORK) LOCK FLUSH 100 UNIT/ML IV SOLN
500.0000 [IU] | Freq: Once | INTRAVENOUS | Status: AC | PRN
  Administered 2021-01-28: 500 [IU]
  Filled 2021-01-28: qty 5

## 2021-01-28 MED ORDER — SODIUM CHLORIDE 0.9% FLUSH
10.0000 mL | INTRAVENOUS | Status: DC | PRN
  Administered 2021-01-28: 10 mL
  Filled 2021-01-28: qty 10

## 2021-02-24 DIAGNOSIS — R221 Localized swelling, mass and lump, neck: Secondary | ICD-10-CM | POA: Diagnosis not present

## 2021-02-24 DIAGNOSIS — U071 COVID-19: Secondary | ICD-10-CM | POA: Diagnosis not present

## 2021-03-10 ENCOUNTER — Inpatient Hospital Stay: Payer: Medicaid Other | Attending: Medical

## 2021-03-16 DEATH — deceased

## 2021-03-17 DIAGNOSIS — Z6821 Body mass index (BMI) 21.0-21.9, adult: Secondary | ICD-10-CM | POA: Diagnosis not present

## 2021-03-17 DIAGNOSIS — R221 Localized swelling, mass and lump, neck: Secondary | ICD-10-CM | POA: Diagnosis not present

## 2021-03-20 ENCOUNTER — Telehealth: Payer: Self-pay | Admitting: Medical Oncology

## 2021-03-20 NOTE — Telephone Encounter (Signed)
  New Large Knot at R neck below earlobe  Non tender ,but is causing ear and head  pain. Denies any respiratory compromise.  Wife stated that his PCP , Dr .Selina Cooley,  is  going to refer pt to ENT and contact Dr Julien Nordmann re Scan.  Action-I told wife that his next scan is expected in November and to see ENT per PCP.  If pain becomes worse or there is any breathing compromise to take pt to ED. I will cc Dr Julien Nordmann with message.  She understands above.

## 2021-03-23 ENCOUNTER — Emergency Department (HOSPITAL_COMMUNITY)
Admission: EM | Admit: 2021-03-23 | Discharge: 2021-03-23 | Disposition: A | Discharge status: Home / Self Care | Payer: Medicaid Other | Attending: Student | Admitting: Student

## 2021-03-23 ENCOUNTER — Encounter (HOSPITAL_COMMUNITY): Payer: Self-pay | Admitting: Emergency Medicine

## 2021-03-23 ENCOUNTER — Other Ambulatory Visit: Payer: Self-pay

## 2021-03-23 DIAGNOSIS — L0291 Cutaneous abscess, unspecified: Secondary | ICD-10-CM

## 2021-03-23 DIAGNOSIS — Z87891 Personal history of nicotine dependence: Secondary | ICD-10-CM | POA: Diagnosis not present

## 2021-03-23 DIAGNOSIS — L02212 Cutaneous abscess of back [any part, except buttock]: Secondary | ICD-10-CM | POA: Diagnosis not present

## 2021-03-23 DIAGNOSIS — R222 Localized swelling, mass and lump, trunk: Secondary | ICD-10-CM | POA: Diagnosis present

## 2021-03-23 MED ORDER — DOXYCYCLINE HYCLATE 100 MG PO CAPS: 100.0000 mg | ORAL_CAPSULE | Freq: Two times a day (BID) | ORAL | 0 refills | Status: AC

## 2021-03-23 MED ORDER — LIDOCAINE-EPINEPHRINE (PF) 2 %-1:200000 IJ SOLN
10.0000 mL | Freq: Once | INTRAMUSCULAR | Status: DC
  Filled 2021-03-23: qty 10

## 2021-03-23 MED ORDER — LIDOCAINE HCL (PF) 1 % IJ SOLN
5.0000 mL | Freq: Once | INTRAMUSCULAR | Status: AC
  Administered 2021-03-23: 5 mL via INTRADERMAL
  Filled 2021-03-23: qty 5

## 2021-03-23 NOTE — ED Provider Notes (Signed)
[facility] EMERGENCY DEPARTMENT Provider Note   CSN: [id number] Arrival date & time: 03/23/21  0009     History Chief Complaint  Patient presents with   Abscess    Jesse [name] is a 55 y.o. male with PMH Hodgkin's lymphoma no longer on chemotherapy, alcohol abuse, marijuana abuse who presents to the emergency department for evaluation of a back abscess and swelling.  He states that he has had the swelling for a long time but over the last 24 hours the swelling has increased in size and is very painful.  Denies fever, chest pain, shortness of breath, abdominal pain, nausea, vomiting, chills or other systemic symptoms.   Abscess Associated symptoms: no fever and no vomiting       Past Medical History:  Diagnosis Date   Cancer ([place])    ETOH abuse    Lymphadenopathy    left   Marijuana abuse     Patient Active Problem List   Diagnosis Date Noted   Peripheral neuropathy 10/17/2019   Port-A-Cath in place 08/08/2019   Encounter for antineoplastic chemotherapy 05/24/2019   Cancer associated pain 05/24/2019   Hodgkin's lymphoma ([place]) 05/10/2019   Goals of care, counseling/discussion 05/10/2019   Lymphadenopathy of left cervical region 04/24/2019    Past Surgical History:  Procedure Laterality Date   HERNIA REPAIR     as child   IR IMAGING GUIDED PORT INSERTION  05/29/2019   LYMPH NODE BIOPSY Left 05/07/2019   Procedure: LYMPH NODE BIOPSY;  Surgeon: Melida Quitter, MD;  Location: [facility];  Service: ENT;  Laterality: Left;       Family History  Problem Relation Age of Onset   Cancer Mother    Cancer Father    Cancer Sister     Social History   Tobacco Use   Smoking status: Former    Packs/day: 1.00    Years: 20.00    Pack years: 20.00    Types: Cigarettes    Quit date: 04/24/1999    Years since quitting: 21.9   Smokeless tobacco: Never  Vaping Use   Vaping Use: Never used  Substance Use Topics   Alcohol use: Yes     Comment: 12 beers daily   Drug use: Yes    Types: Marijuana    Comment: daily    Home Medications Prior to Admission medications   Medication Sig Start Date End Date Taking? Authorizing Provider  doxycycline (VIBRAMYCIN) 100 MG capsule Take 1 capsule (100 mg total) by mouth 2 (two) times daily for 7 days. 03/23/21 03/30/21 Yes Albertia Carvin, MD  gabapentin (NEURONTIN) 100 MG capsule TAKE 1 CAPSULE BY MOUTH 3 TIMES DAILY 03/18/20   Heilingoetter, Cassandra L, PA-C  lidocaine (LIDODERM) 5 % Place 1 patch onto the skin daily. Remove & Discard patch within 12 hours or as directed by MD 12/01/20   Gareth Morgan, MD  lidocaine-prilocaine (EMLA) cream Apply 1 application topically as needed. Apply to port site  45 minutes prior to port being accessed Patient taking differently: Apply 1 application topically as needed. Apply to port site  45 minutes prior to port being accessed 06/12/19   Heilingoetter, Cassandra L, PA-C  methylPREDNISolone (MEDROL DOSEPAK) 4 MG TBPK tablet Per packet instructions 12/01/20   Gareth Morgan, MD  naproxen sodium (ALEVE) 220 MG tablet Take 660 mg by mouth daily as needed (pain).    [provider]  oxyCODONE-acetaminophen (PERCOCET) 7.5-325 MG tablet Take 1 tablet by mouth every 6 (six)  hours. 12/05/20   [provider]  prochlorperazine (COMPAZINE) 10 MG tablet Take 1 tablet (10 mg total) by mouth every 8 (eight) hours as needed for nausea or vomiting. 06/12/19   Heilingoetter, Cassandra L, PA-C    Allergies    Patient has no known allergies.  Review of Systems   Review of Systems  Constitutional:  Negative for chills and fever.  HENT:  Negative for ear pain and sore throat.   Eyes:  Negative for pain and visual disturbance.  Respiratory:  Negative for cough and shortness of breath.   Cardiovascular:  Negative for chest pain and palpitations.  Gastrointestinal:  Negative for abdominal pain and vomiting.  Genitourinary:  Negative for dysuria  and hematuria.  Musculoskeletal:  Negative for arthralgias and back pain.  Skin:  Positive for rash. Negative for color change.  Neurological:  Negative for seizures and syncope.  All other systems reviewed and are negative.  Physical Exam Updated Vital Signs BP 139/88   Pulse 68   Temp (!) 97.5 F (36.4 C) (Oral)   Resp 20   Ht 5' (1.524 m)   Wt 62 kg   SpO2 99%   BMI 26.69 kg/m   Physical Exam Vitals and nursing note reviewed.  Constitutional:      Appearance: He is well-developed.  HENT:     Head: Normocephalic and atraumatic.  Eyes:     Conjunctiva/sclera: Conjunctivae normal.  Cardiovascular:     Rate and Rhythm: Normal rate and regular rhythm.     Heart sounds: No murmur heard. Pulmonary:     Effort: Pulmonary effort is normal. No respiratory distress.     Breath sounds: Normal breath sounds.  Abdominal:     Palpations: Abdomen is soft.     Tenderness: There is no abdominal tenderness.  Musculoskeletal:     Cervical back: Neck supple.  Skin:    General: Skin is warm and dry.     Findings: Lesion (3 cm area of tenderness and fluctuance over the right low back) present.  Neurological:     Mental Status: He is alert.    ED Results / Procedures / Treatments   Labs (all labs ordered are listed, but only abnormal results are displayed) Labs Reviewed - No data to display  EKG None  Radiology No results found.  Procedures .Marland KitchenIncision and Drainage  Date/Time: 03/23/2021 6:54 AM Performed by: Teressa Lower, MD Authorized by: Teressa Lower, MD   Location:    Type:  Abscess   Size:  3   Location:  Trunk   Trunk location:  Back Pre-procedure details:    Skin preparation:  Chlorhexidine Anesthesia:    Anesthesia method:  Local infiltration Procedure details:    Incision types:  Single straight   Incision depth:  Subcutaneous   Wound management:  Probed and deloculated, extensive cleaning and debrided   Drainage:  Purulent and serosanguinous    Drainage amount:  Copious   Wound treatment:  Wound left open Post-procedure details:    Procedure completion:  Tolerated well, no immediate complications   Medications Ordered in ED Medications  lidocaine (PF) (XYLOCAINE) 1 % injection 5 mL (5 mLs Intradermal Given by Other 03/23/21 [id number])    ED Course  I have reviewed the triage vital signs and the nursing notes.  Pertinent labs & imaging results that were available during my care of the patient were reviewed by me and considered in my medical decision making (see chart for details).    MDM Rules/Calculators/A&P  Patient seen in the emergency department for evaluation of a back abscess.  Physical exam reveals a 3 cm tender area of fluctuance and erythema consistent with an abscess.  Patient was anesthetized using 1% lidocaine without epinephrine an incision and drainage was performed leading to evacuation of copious foul-smelling purulent material.  The wound was left open and covered with a nonadherent pad, sterile gauze, triple antibiotic ointment and paper tape.  He was then discharged with a prescription for doxycycline and instructed to follow-up with his primary care physician for a wound check in 1 week.  He was given return precautions including fever, chest pain, shortness of breath, nausea or vomiting or any other concerning symptoms of which he voiced understanding. Final Clinical Impression(s) / ED Diagnoses Final diagnoses:  Abscess    Rx / DC Orders ED Discharge Orders          Ordered    doxycycline (VIBRAMYCIN) 100 MG capsule  2 times daily        03/23/21 0617             Keanan Melander, Debe Coder, MD 03/23/21 [phone]

## 2021-03-23 NOTE — ED Provider Notes (Signed)
MSE was initiated and I personally evaluated the patient and placed orders (if any) at  12:47 AM on March 23, 2021.  Patient to ED for evaluation of large painful swelling on his back. He reports swelling is chronic but pain and redness started several days ago. No fever. H/O lymphoma, in remission. Also reports large nodal swelling to right mandibular angle. (Seen by oncology with recommendation for ENT follow up which the patient has not followed through with)  Today's Vitals   03/23/21 0026 03/23/21 0026  BP:  116/79  Pulse:  74  Resp:  18  Temp:  98 F (36.7 C)  TempSrc:  Oral  SpO2:  99%  Weight: 62 kg   Height: 5' (1.524 m)   PainSc: 8     Body mass index is 26.69 kg/m.  Large fluctuant cyst to mid back at throacic border, requiring I&D.  The patient appears stable so that the remainder of the MSE may be completed by another provider.   Charlann Lange, PA-C [id number]    Delora Fuel, MD [id number] [phone]

## 2021-03-23 NOTE — ED Notes (Addendum)
Lidocaine and drainage tray at pt bedside for provider use.

## 2021-03-23 NOTE — ED Triage Notes (Signed)
Patient reports increasing size of cystic abscess at right lower back this week with swelling , no drainage , denies fever or chills . Patient added right neck lymphadenopathy .

## 2021-03-23 NOTE — ED Notes (Signed)
RN covered pt's drained abscess with non-adherent pad and sterile gauze & paper tape

## 2021-03-23 NOTE — ED Notes (Signed)
E-signature pad unavailable at time of pt discharge. This RN discussed discharge materials with pt and answered all pt questions. Pt stated understanding of discharge material. ? ?

## 2021-04-02 DIAGNOSIS — L728 Other follicular cysts of the skin and subcutaneous tissue: Secondary | ICD-10-CM | POA: Diagnosis not present

## 2021-04-02 DIAGNOSIS — C8111 Nodular sclerosis classical Hodgkin lymphoma, lymph nodes of head, face, and neck: Secondary | ICD-10-CM | POA: Diagnosis not present

## 2021-04-02 DIAGNOSIS — R221 Localized swelling, mass and lump, neck: Secondary | ICD-10-CM | POA: Diagnosis not present

## 2021-04-21 ENCOUNTER — Inpatient Hospital Stay: Payer: Medicaid Other | Attending: Medical

## 2021-04-30 DIAGNOSIS — R221 Localized swelling, mass and lump, neck: Secondary | ICD-10-CM | POA: Diagnosis not present

## 2021-05-12 ENCOUNTER — Other Ambulatory Visit: Payer: Self-pay | Admitting: Otolaryngology

## 2021-05-12 DIAGNOSIS — R221 Localized swelling, mass and lump, neck: Secondary | ICD-10-CM

## 2021-05-14 ENCOUNTER — Inpatient Hospital Stay: Admission: RE | Admit: 2021-05-14 | Payer: Medicaid Other | Source: Ambulatory Visit

## 2021-05-20 ENCOUNTER — Other Ambulatory Visit: Payer: Self-pay | Admitting: Otolaryngology

## 2021-05-20 DIAGNOSIS — R221 Localized swelling, mass and lump, neck: Secondary | ICD-10-CM

## 2021-05-21 ENCOUNTER — Ambulatory Visit
Admission: RE | Admit: 2021-05-21 | Discharge: 2021-05-21 | Disposition: A | Discharge status: Home / Self Care | Payer: Medicaid Other | Source: Ambulatory Visit | Attending: Otolaryngology | Admitting: Otolaryngology

## 2021-05-21 DIAGNOSIS — R221 Localized swelling, mass and lump, neck: Secondary | ICD-10-CM | POA: Diagnosis not present

## 2021-05-21 IMAGING — CT CT NECK W/ CM
5 of 6 series · 14 of 33 positions shown, 16 images · IV contrast (iopamidol)
Comparison: [DATE] PET CT and [DATE] neck CT.

CLINICAL DATA: Mass on right side of neck for 6 months. History of
Hodgkin's lymphoma.

EXAM:
CT NECK WITH CONTRAST
TECHNIQUE: Multidetector CT imaging of the neck was performed using the
standard protocol following the bolus administration of intravenous
contrast.
CONTRAST:  75mL [C4] IOPAMIDOL ([C4]) INJECTION 61%

[Series 2: neck 2.00 br40 s3 st/ no angle · axial · 0.47mm/px · z∈[-772,-686]mm · 2 of 129 slices shown, 3 images]
[im 43/129  soft-tissue]
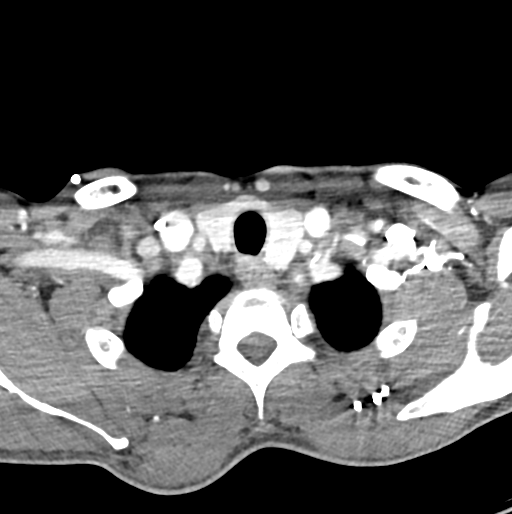
[im 43/129  bone]
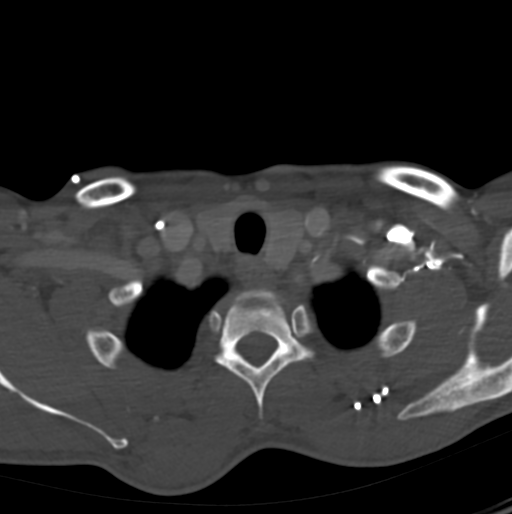
[im 86/129  bone]
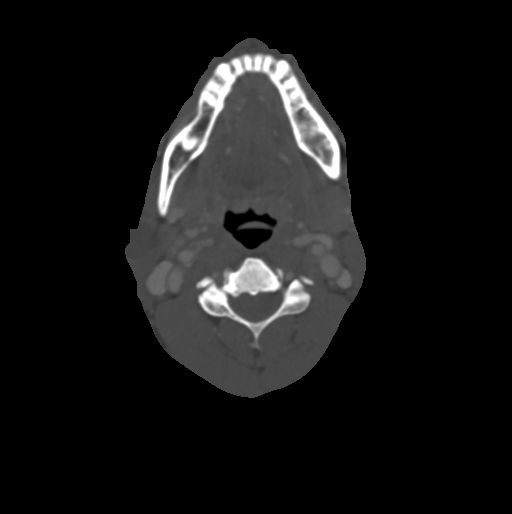

[Series 4: neck 2.00 br60 s3 bone/ no angle · axial · 0.47mm/px · z∈[-772,-686]mm · 2 of 129 slices shown]
[im 43/129  bone]
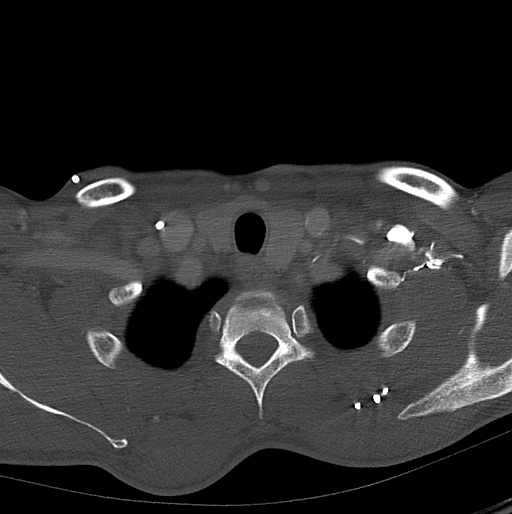
[im 86/129  bone]
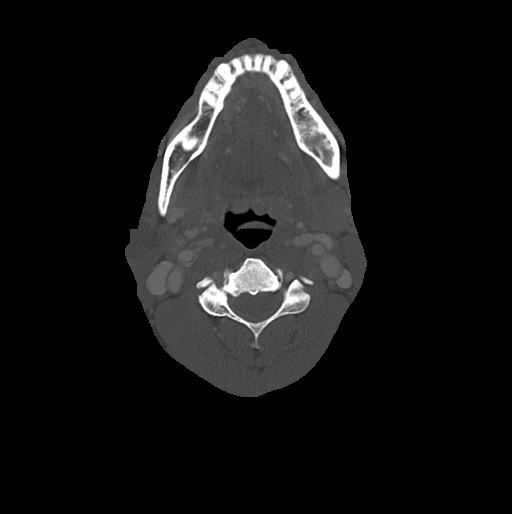

[Series 6: neck 2.00 br36 s3 angled axial (person_name) · axial · 0.47mm/px · z∈[-772,-686]mm · 2 of 129 slices shown]
[im 43/129  bone]
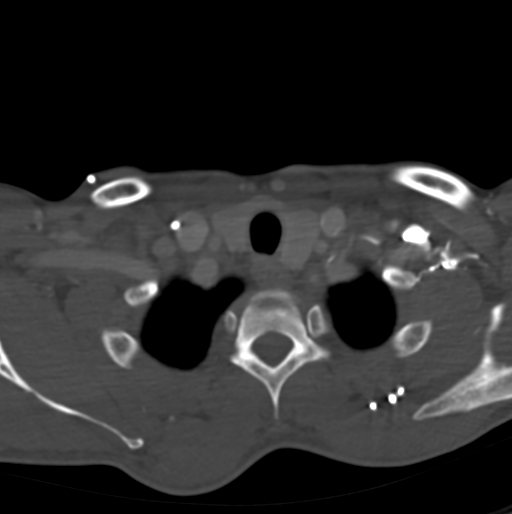
[im 86/129  bone]
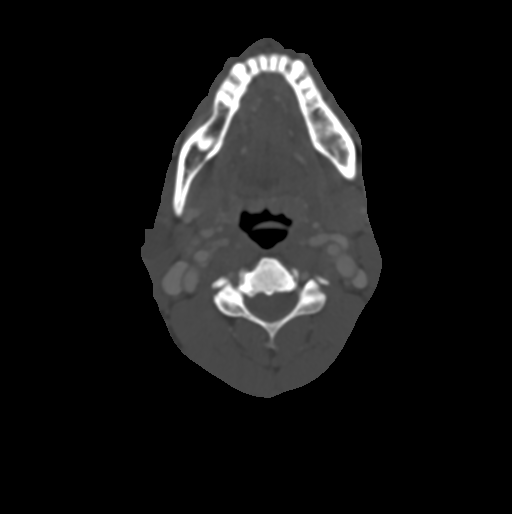

[Series 10: neck 2.00 br40 s3 (person_name) · coronal · 0.47mm/px · 3 of 147 slices shown (1 of 2)]
[im 30/147  bone]
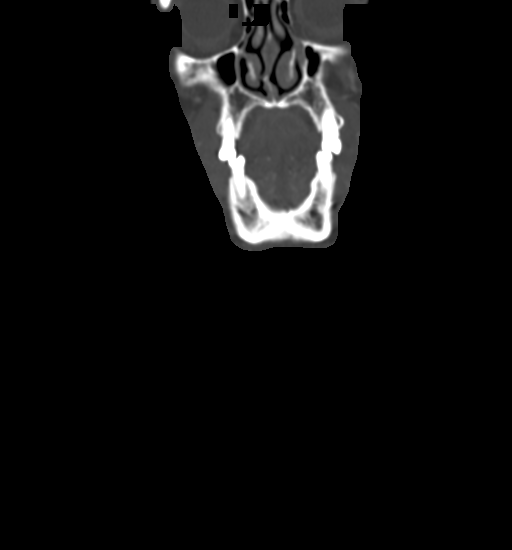
[im 59/147  bone]
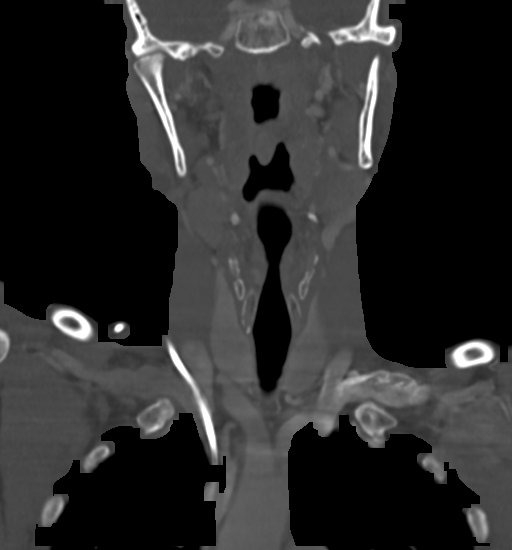
[im 88/147  bone]
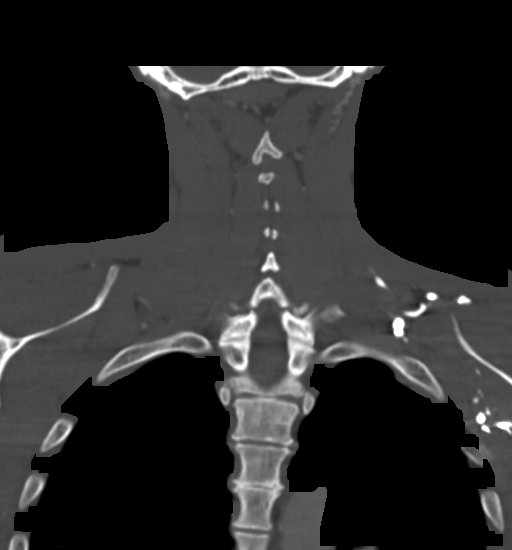

[Series 12: neck 2.00 br40 s3 (person_name) · sagittal · 0.51mm/px · 5 of 120 slices shown, 6 images (2 of 2)]
[im 40/120  bone]
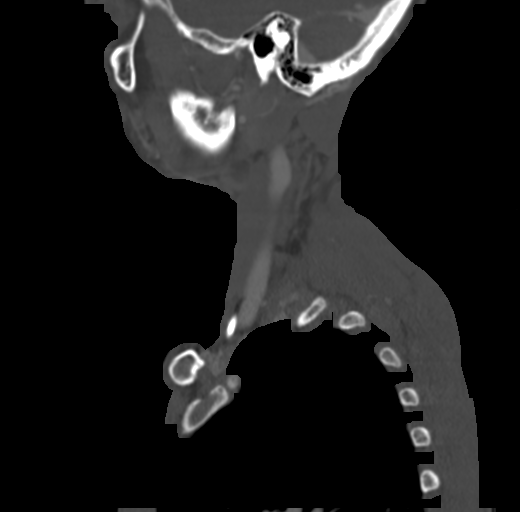
[im 50/120  bone]
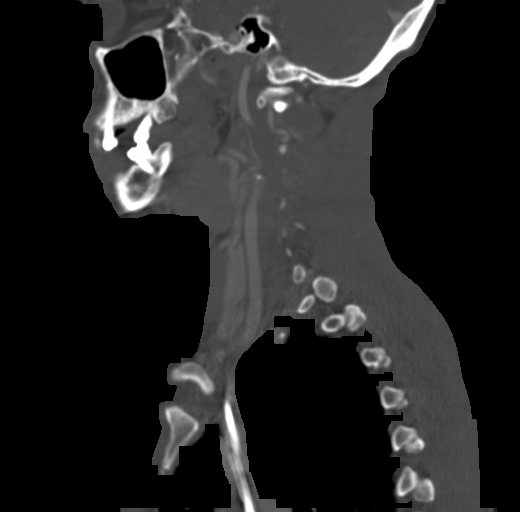
[im 60/120  soft-tissue]
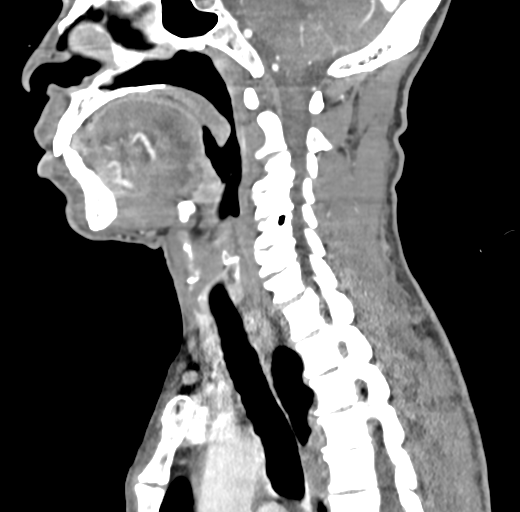
[im 60/120  bone]
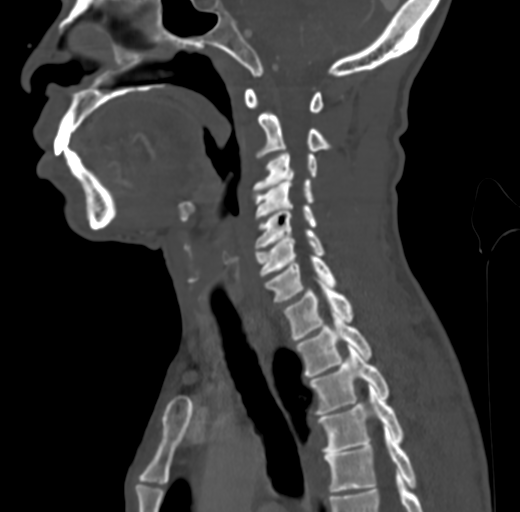
[im 70/120  bone]
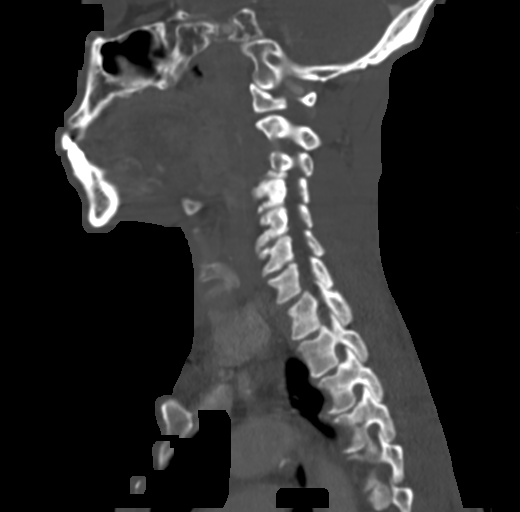
[im 80/120  bone]
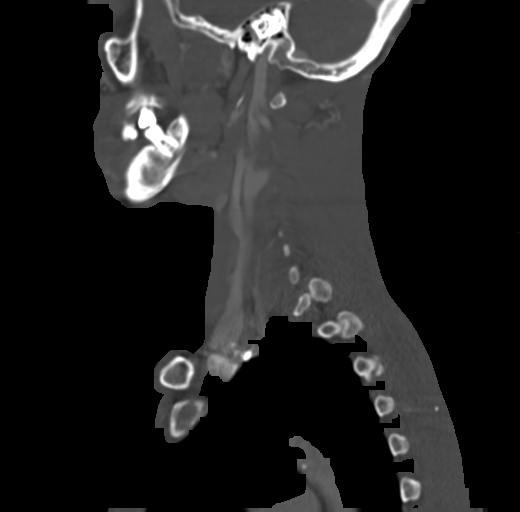

[14 of 33 positions shown; findings below may reference images not displayed]

FINDINGS: Pharynx and larynx: Negative for mass or thickening of Waldeyer's
ring.

Salivary glands: Mass at the right parotid tail discussed below.

Thyroid: Normal

Lymph nodes: Stable normal sized lymph nodes in the left jugular
chain. A right parotid tail mass is likely a cystic lymph node given
initial PET CT an interval evolution. The cystic component is larger
than before at 20 mm (previously 16mm ) but has a more simple cystic
density. The adjacent margins blend with the parotid parenchyma and
are non measurable for comparison purposes. Previously there was a
luminal nodular type component posteriorly which is not seen today.

Vascular: Unremarkable visualized portions of right IJ porta
catheter.

Limited intracranial: Negative

Visualized orbits: Negative

Mastoids and visualized paranasal sinuses: Clear

Skeleton: Stable sclerosis in the left mandible which is likely from
remote inflammation. Diffuse cervical spine degeneration, advanced
for age.

Upper chest: Mild airway thickening.  No adenopathy seen.
IMPRESSION: Mildly larger but more simple cystic mass in the right parotid tail,
likely post treatment cavitation of a lymphomatous node. Anticipate
stability on recommended follow-up imaging. Stable left neck where
the bulkiest lymphoma was noted at diagnosis.

## 2021-05-21 MED ORDER — IOPAMIDOL (ISOVUE-300) INJECTION 61%
75.0000 mL | Freq: Once | INTRAVENOUS | Status: AC | PRN
  Administered 2021-05-21: 75 mL via INTRAVENOUS

## 2021-06-01 ENCOUNTER — Other Ambulatory Visit: Payer: Self-pay | Admitting: Otolaryngology

## 2021-06-01 DIAGNOSIS — D210 Benign neoplasm of connective and other soft tissue of head, face and neck: Secondary | ICD-10-CM | POA: Diagnosis not present

## 2021-06-01 DIAGNOSIS — Z8579 Personal history of other malignant neoplasms of lymphoid, hematopoietic and related tissues: Secondary | ICD-10-CM | POA: Diagnosis not present

## 2021-06-01 DIAGNOSIS — R221 Localized swelling, mass and lump, neck: Secondary | ICD-10-CM | POA: Diagnosis not present

## 2021-06-01 DIAGNOSIS — K116 Mucocele of salivary gland: Secondary | ICD-10-CM | POA: Diagnosis not present

## 2021-06-02 ENCOUNTER — Inpatient Hospital Stay: Payer: Medicaid Other | Attending: Medical

## 2021-06-15 NOTE — Progress Notes (Deleted)
[facility] OFFICE PROGRESS NOTE  Vonna Drafts, FNP [address] [place] [address]  DIAGNOSIS: Stage IVB Hodgkin's Lymphoma, nodular sclerosis subtype. He presented with a bulky left cervical and supraclavicular lymphadenopathy as well as involvement of the spleen, nodal disease in the porta hepatis, and extensive disease involving the marrow of the pelvis, spine, and ribs. He was diagnosed in September 2020.    IPS score: 4   PRIOR THERAPY: Systemic chemotherapy with brentuximab vedotin 1.2 mg/kg, doxorubicin 25 mg/m2, vinblastine 6 mg/m2, dacarbazine 375 mg/m2 on days 1 and 15 every 28 days. First dose expected on 05/30/2019.   Status post 6 cycles.   CURRENT THERAPY: Observation  INTERVAL HISTORY: Jesse [name] 55 y.o. male returns to clinic today for a 50-month follow-up visit.  The patient was last seen in the clinic on 12/16/2020.  At that time, the patient had a newly enlarged right cervical lymph node which was recurrent concerning for disease recurrence so he had a PET scan performed which had shown ***.  It was recommended that the patient continue on observation.  The patient was recently evaluated by ENT, Dr. Redmond Baseman who performed a CT of the neck on 05/21/2021 which showed mildly larger but more simple cystic mass in the right parotid tail, likely posttreatment cavitation of a lymphomatous node.   Otherwise, he denies fevers, chills, night sweats, or weight loss. Denies any signs or symptoms infection except for his dental concerns with the right upper back teeth. This was pulled a few weeks ago and he completed antibiotics. Denies early satiety or abdominal fullness. Denies bleeding or bruising. Denies other lymphadenopathy. He is here for evaluation and to review his scan results and for a more detailed discussion about his current condition and recommended treatment options.   MEDICAL HISTORY: Past Medical History:  Diagnosis Date    Cancer ([place])    ETOH abuse    Lymphadenopathy    left   Marijuana abuse     ALLERGIES:  has No Known Allergies.  MEDICATIONS:  Current Outpatient Medications  Medication Sig Dispense Refill   gabapentin (NEURONTIN) 100 MG capsule TAKE 1 CAPSULE BY MOUTH 3 TIMES DAILY 90 capsule 2   lidocaine (LIDODERM) 5 % Place 1 patch onto the skin daily. Remove & Discard patch within 12 hours or as directed by MD 30 patch 0   lidocaine-prilocaine (EMLA) cream Apply 1 application topically as needed. Apply to port site  45 minutes prior to port being accessed (Patient taking differently: Apply 1 application topically as needed. Apply to port site  45 minutes prior to port being accessed) 30 g 1   methylPREDNISolone (MEDROL DOSEPAK) 4 MG TBPK tablet Per packet instructions 21 each 0   naproxen sodium (ALEVE) 220 MG tablet Take 660 mg by mouth daily as needed (pain).     oxyCODONE-acetaminophen (PERCOCET) 7.5-325 MG tablet Take 1 tablet by mouth every 6 (six) hours.     prochlorperazine (COMPAZINE) 10 MG tablet Take 1 tablet (10 mg total) by mouth every 8 (eight) hours as needed for nausea or vomiting. 30 tablet 2   No current facility-administered medications for this visit.    SURGICAL HISTORY:  Past Surgical History:  Procedure Laterality Date   HERNIA REPAIR     as child   IR IMAGING GUIDED PORT INSERTION  05/29/2019   LYMPH NODE BIOPSY Left 05/07/2019   Procedure: LYMPH NODE BIOPSY;  Surgeon: Melida Quitter, MD;  Location: [facility];  Service: ENT;  Laterality: Left;    REVIEW OF SYSTEMS:   Review of Systems  Constitutional: Negative for appetite change, chills, fatigue, fever and unexpected weight change.  HENT:   Negative for mouth sores, nosebleeds, sore throat and trouble swallowing.   Eyes: Negative for eye problems and icterus.  Respiratory: Negative for cough, hemoptysis, shortness of breath and wheezing.   Cardiovascular: Negative for chest pain and leg swelling.   Gastrointestinal: Negative for abdominal pain, constipation, diarrhea, nausea and vomiting.  Genitourinary: Negative for bladder incontinence, difficulty urinating, dysuria, frequency and hematuria.   Musculoskeletal: Negative for back pain, gait problem, neck pain and neck stiffness.  Skin: Negative for itching and rash.  Neurological: Negative for dizziness, extremity weakness, gait problem, headaches, light-headedness and seizures.  Hematological: Negative for adenopathy. Does not bruise/bleed easily.  Psychiatric/Behavioral: Negative for confusion, depression and sleep disturbance. The patient is not nervous/anxious.     PHYSICAL EXAMINATION:  There were no vitals taken for this visit.  ECOG PERFORMANCE STATUS: {CHL ONC ECOG [id number]  Physical Exam  Constitutional: Oriented to person, place, and time and well-developed, well-nourished, and in no distress. No distress.  HENT:  Head: Normocephalic and atraumatic.  Mouth/Throat: Oropharynx is clear and moist. No oropharyngeal exudate.  Eyes: Conjunctivae are normal. Right eye exhibits no discharge. Left eye exhibits no discharge. No scleral icterus.  Neck: Normal range of motion. Neck supple.  Cardiovascular: Normal rate, regular rhythm, normal heart sounds and intact distal pulses.   Pulmonary/Chest: Effort normal and breath sounds normal. No respiratory distress. No wheezes. No rales.  Abdominal: Soft. Bowel sounds are normal. Exhibits no distension and no mass. There is no tenderness.  Musculoskeletal: Normal range of motion. Exhibits no edema.  Lymphadenopathy:    No cervical adenopathy.  Neurological: Alert and oriented to person, place, and time. Exhibits normal muscle tone. Gait normal. Coordination normal.  Skin: Skin is warm and dry. No rash noted. Not diaphoretic. No erythema. No pallor.  Psychiatric: Mood, memory and judgment normal.  Vitals reviewed.  LABORATORY DATA: Lab Results  Component Value Date   WBC 2.8  (L) 12/01/2020   HGB 12.6 (L) 12/01/2020   HCT 38.1 (L) 12/01/2020   MCV 94.8 12/01/2020   PLT 269 12/01/2020      Chemistry      Component Value Date/Time   NA 135 12/01/2020 0320   K 3.8 12/01/2020 0320   CL 106 12/01/2020 0320   CO2 24 12/01/2020 0320   BUN 13 12/01/2020 0320   CREATININE 0.79 12/01/2020 0320   CREATININE 0.91 11/18/2020 0934      Component Value Date/Time   CALCIUM 8.9 12/01/2020 0320   ALKPHOS 69 12/01/2020 0320   AST 22 12/01/2020 0320   AST 17 11/18/2020 0934   ALT 19 12/01/2020 0320   ALT 21 11/18/2020 0934   BILITOT 0.8 12/01/2020 0320   BILITOT 0.5 11/18/2020 0934       RADIOGRAPHIC STUDIES:  CT SOFT TISSUE NECK W CONTRAST  Result Date: 05/23/2021 CLINICAL DATA:  Mass on right side of neck for 6 months. History of Hodgkin's lymphoma. EXAM: CT NECK WITH CONTRAST TECHNIQUE: Multidetector CT imaging of the neck was performed using the standard protocol following the bolus administration of intravenous contrast. CONTRAST:  1mL ISOVUE-300 IOPAMIDOL (ISOVUE-300) INJECTION 61% COMPARISON:  12/10/2020 PET CT and 11/07/2020 neck CT. FINDINGS: Pharynx and larynx: Negative for mass or thickening of Waldeyer's ring. Salivary glands: Mass at the right parotid tail discussed below. Thyroid: Normal  Lymph nodes: Stable normal sized lymph nodes in the left jugular chain. A right parotid tail mass is likely a cystic lymph node given initial PET CT an interval evolution. The cystic component is larger than before at 20 mm (previously 55mm ) but has a more simple cystic density. The adjacent margins blend with the parotid parenchyma and are non measurable for comparison purposes. Previously there was a luminal nodular type component posteriorly which is not seen today. Vascular: Unremarkable visualized portions of right IJ porta catheter. Limited intracranial: Negative Visualized orbits: Negative Mastoids and visualized paranasal sinuses: Clear Skeleton: Stable sclerosis in  the left mandible which is likely from remote inflammation. Diffuse cervical spine degeneration, advanced for age. Upper chest: Mild airway thickening.  No adenopathy seen. IMPRESSION: Mildly larger but more simple cystic mass in the right parotid tail, likely post treatment cavitation of a lymphomatous node. Anticipate stability on recommended follow-up imaging. Stable left neck where the bulkiest lymphoma was noted at diagnosis. Electronically Signed   By: Jorje Guild M.D.   On: 05/23/2021 07:38     ASSESSMENT/PLAN:  This is a very pleasant 55 years old African-American male diagnosed with a stage IV Hodgkin lymphoma, nodular sclerosing subtype. He presented with a bulky left cervical and supraclavicular lymphadenopathy as well as involvement of the spleen, nodal disease in the porta hepatis, and extensive disease involving the marrow of the pelvis, spine, and ribs. He was diagnosed in September 2020.   He underwent systemic chemotherapy with brentuximab Vedotin in addition to doxorubicin, vinblastine and dacarbazine.  Status post 6 cycles. His last dose was in October 2021.    His last PET scan after the treatment showed almost complete resolution of his disease with no hypermetabolic activity in the neck, chest, abdomen pelvis or the osseous structures.  He has Deauville1. The patient is currently on observation and he is feeling fine today with no concerning complaints.  In February 2022, the patient noted a new non-tender right cervical lymph node. He recently had a CT chest and neck to further evaluate this. The scan showed interval development of necrotic lymph node in the right mid neck measuring 17 mm. This is most compatible with recurrent tumor. However, he had a PET scan to evaluate this which did not show any evidence of lymphoma recurrence on FDG PET scan.   Patient recently had a restaging CT of the neck, chest, abdomen, and pelvis.  The patient was seen with Dr. Julien Nordmann today.  Dr.  Julien Nordmann personally and independently reviewed the scan discussed results with the patient today.  The scan showed  ***Dr. Julien Nordmann recommends that the patient continue on observation with a restaging CT scan in 6 months.  I will arrange for the patient to have a Port-A-Cath flush every 6 weeks.  The patient was advised to call immediately if she has any concerning symptoms in the interval. The patient voices understanding of current disease status and treatment options and is in agreement with the current care plan. All questions were answered. The patient knows to call the clinic with any problems, questions or concerns. We can certainly see the patient much sooner if necessary     No orders of the defined types were placed in this encounter.    I spent {CHL ONC TIME VISIT - [id number]} counseling the patient face to face. The total time spent in the appointment was {CHL ONC TIME VISIT - [id number]}.  Addilyn Satterwhite L Malie Kashani, PA-C 06/15/21

## 2021-06-17 ENCOUNTER — Ambulatory Visit (HOSPITAL_COMMUNITY): Payer: Medicaid Other

## 2021-06-18 ENCOUNTER — Telehealth: Payer: Self-pay | Admitting: Physician Assistant

## 2021-06-18 ENCOUNTER — Inpatient Hospital Stay: Payer: Medicaid Other | Admitting: Physician Assistant

## 2021-06-18 NOTE — Telephone Encounter (Signed)
I called and spoke to the patient's wife about his missed CT scan appointment.  Gave her instructions to call radiology scheduling to reschedule her patient CT scan of the neck, chest, abdomen and pelvis.  After she has that appointment, she was given our office number and instructed to call scheduling to schedule a follow-up visit with me or Dr. Julien Nordmann approximately 2 to 3 days after his CT scan date.  She is expressed understanding with the instructions.

## 2021-06-22 ENCOUNTER — Telehealth: Payer: Self-pay | Admitting: Physician Assistant

## 2021-06-22 NOTE — Telephone Encounter (Signed)
Sch per 11/7 inbasket , pt wife aware

## 2021-06-25 ENCOUNTER — Ambulatory Visit (HOSPITAL_COMMUNITY)
Admission: RE | Admit: 2021-06-25 | Discharge: 2021-06-25 | Disposition: A | Discharge status: Home / Self Care | Payer: Medicaid Other | Source: Ambulatory Visit | Attending: Physician Assistant | Admitting: Physician Assistant

## 2021-06-25 DIAGNOSIS — I2510 Atherosclerotic heart disease of native coronary artery without angina pectoris: Secondary | ICD-10-CM | POA: Diagnosis not present

## 2021-06-25 DIAGNOSIS — C8111 Nodular sclerosis classical Hodgkin lymphoma, lymph nodes of head, face, and neck: Secondary | ICD-10-CM | POA: Diagnosis present

## 2021-06-25 DIAGNOSIS — C8110 Nodular sclerosis classical Hodgkin lymphoma, unspecified site: Secondary | ICD-10-CM | POA: Diagnosis not present

## 2021-06-25 DIAGNOSIS — C8590 Non-Hodgkin lymphoma, unspecified, unspecified site: Secondary | ICD-10-CM | POA: Diagnosis not present

## 2021-06-25 DIAGNOSIS — I700 Atherosclerosis of aorta: Secondary | ICD-10-CM | POA: Diagnosis not present

## 2021-06-25 DIAGNOSIS — R918 Other nonspecific abnormal finding of lung field: Secondary | ICD-10-CM | POA: Diagnosis not present

## 2021-06-25 DIAGNOSIS — R911 Solitary pulmonary nodule: Secondary | ICD-10-CM | POA: Diagnosis not present

## 2021-06-25 IMAGING — CT CT CHEST W/ CM
2 of 4 series · 13 of 36 positions shown, 16 images · IV contrast (ISOVUE 370)
Comparison: PET-CT, [DATE]

CLINICAL DATA: Nodular sclerosing Hodgkin's lymphoma, neck mass

EXAM:
CT CHEST, ABDOMEN, AND PELVIS WITH CONTRAST
TECHNIQUE: Multidetector CT imaging of the chest, abdomen and pelvis was
performed following the standard protocol during bolus
administration of intravenous contrast.
CONTRAST:  80mL OMNIPAQUE IOHEXOL 350 MG/ML SOLN, additional oral
enteric contrast

[Series 504: cap with · axial · 0.82mm/px · z∈[+1048,+1583]mm · 10 of 129 slices shown, 13 images]
[im 11/129  mediastinal]
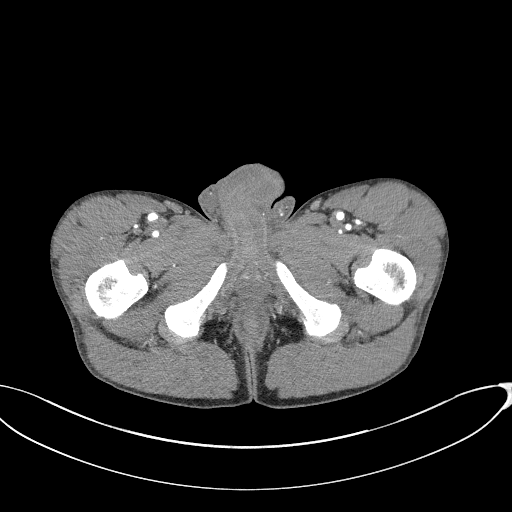
[im 11/129  lung]
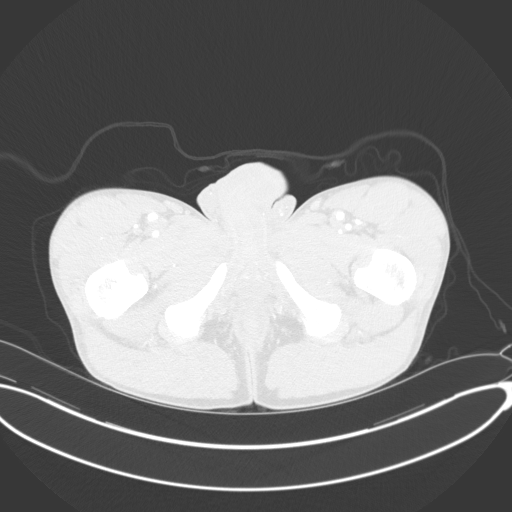
[im 22/129  lung]
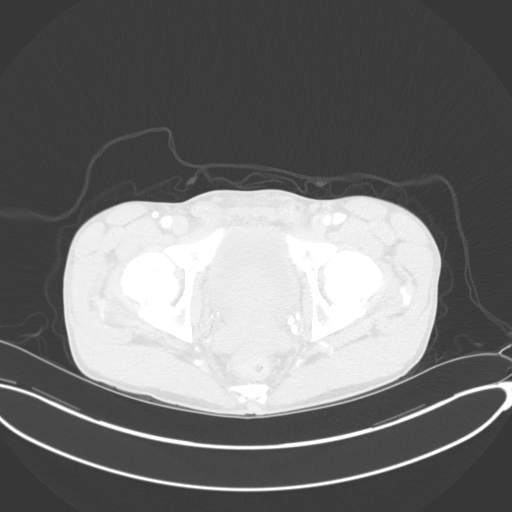
[im 33/129  lung]
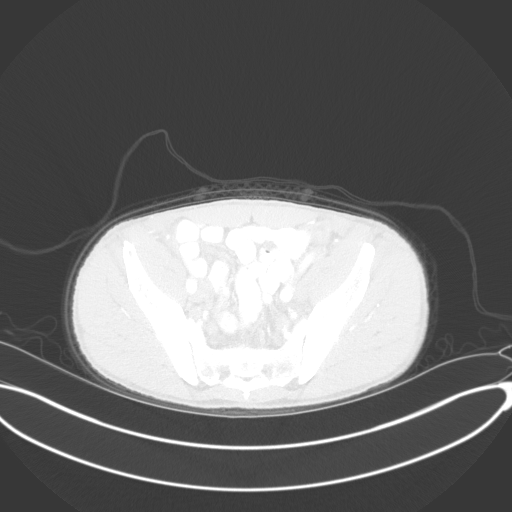
[im 43/129  lung]
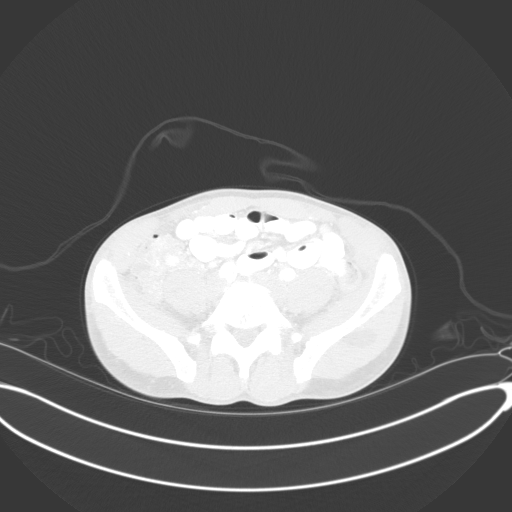
[im 54/129  mediastinal]
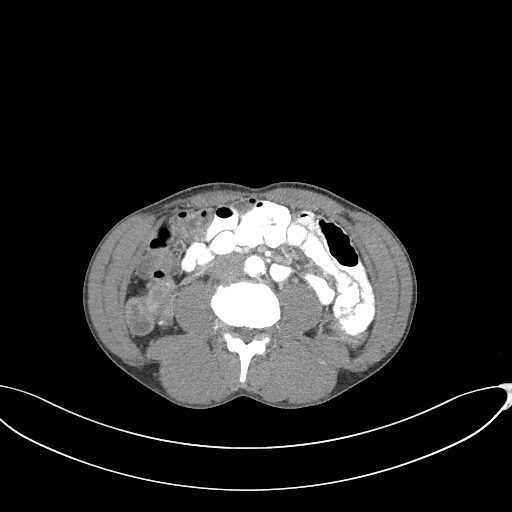
[im 54/129  lung]
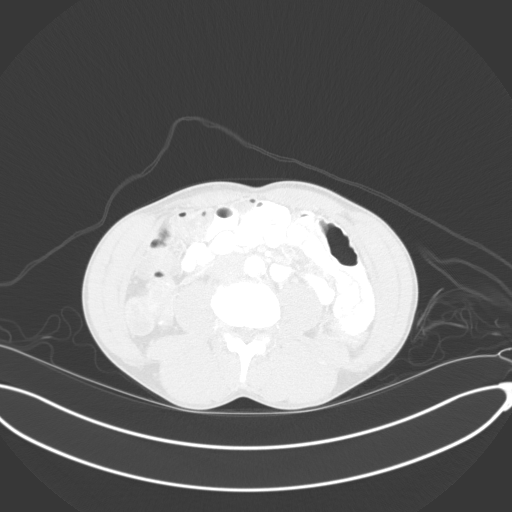
[im 75/129  lung]
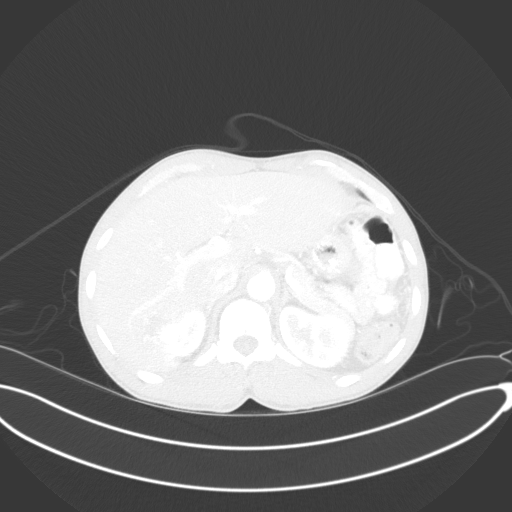
[im 86/129  lung]
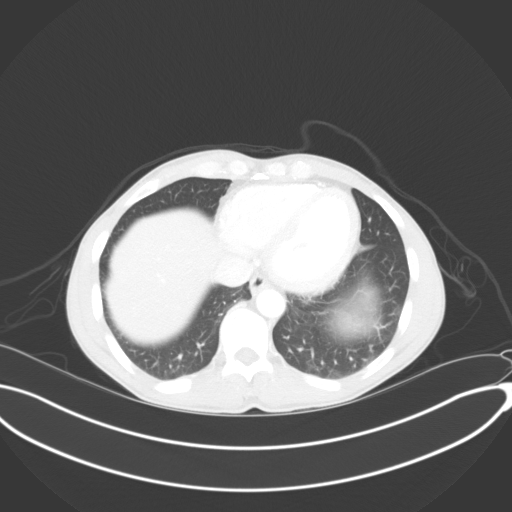
[im 97/129  lung]
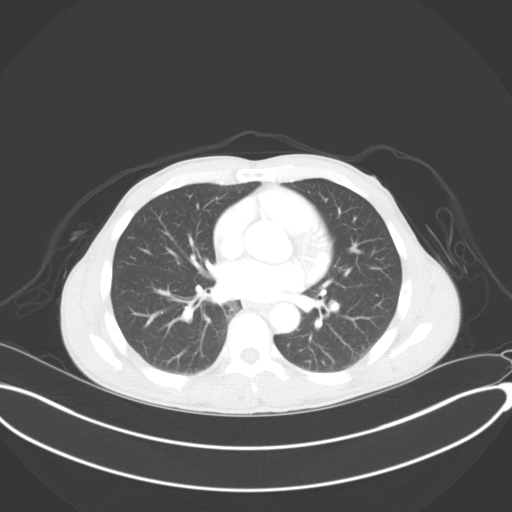
[im 107/129  mediastinal]
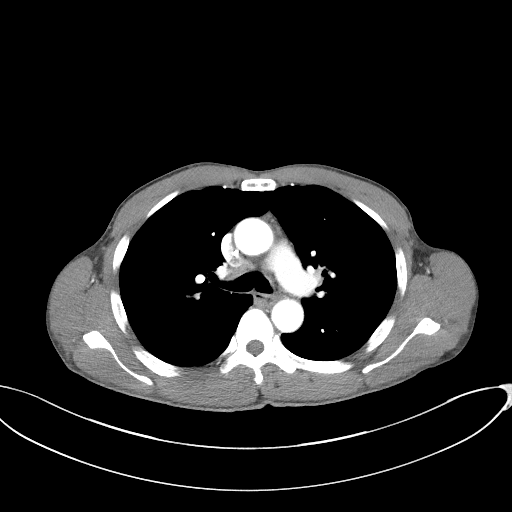
[im 107/129  lung]
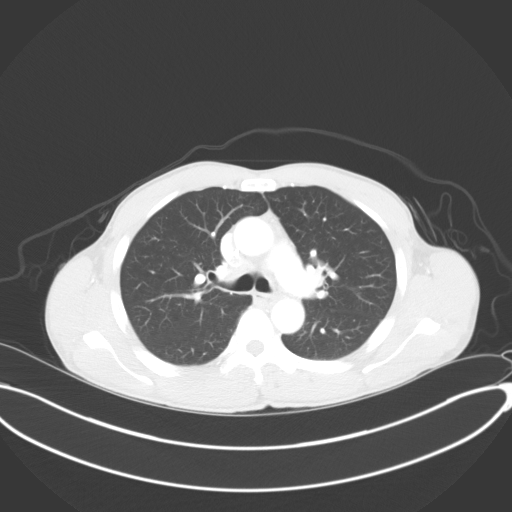
[im 118/129  lung]
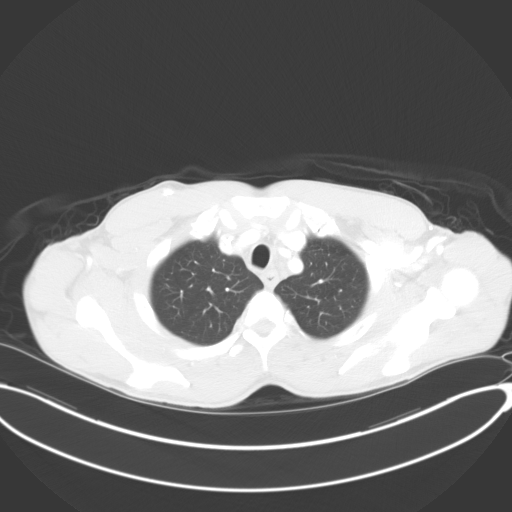

[Series 506: coronals · coronal · 0.82mm/px · 3 of 142 slices shown]
[im 29/142  lung]
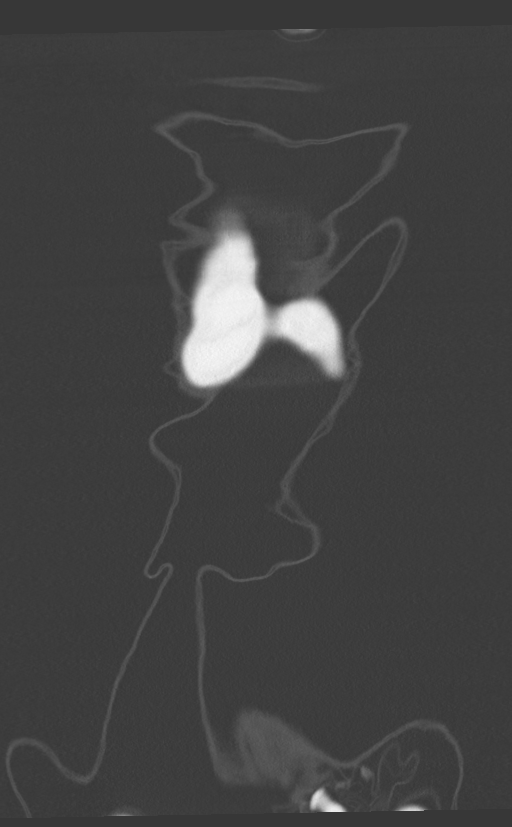
[im 57/142  lung]
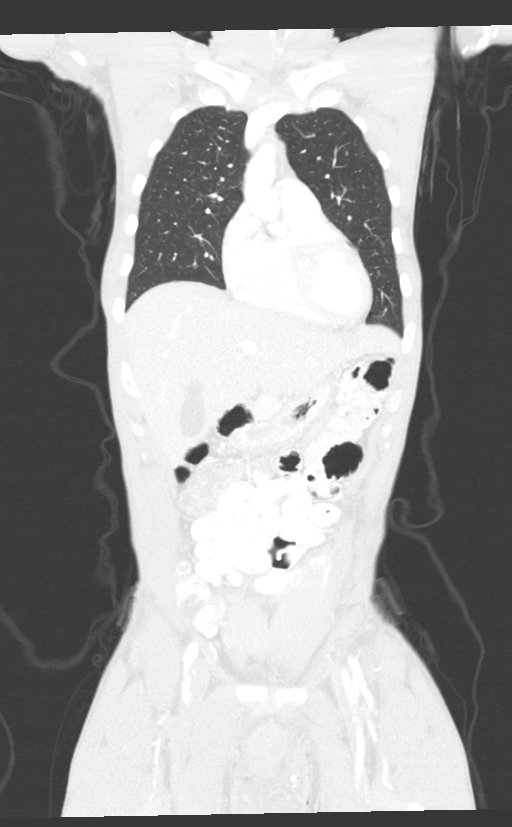
[im 85/142  lung]
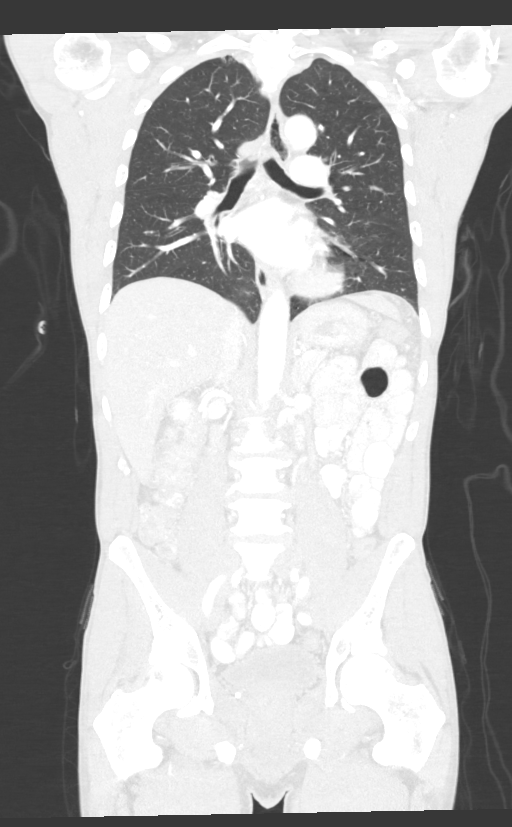

[13 of 36 positions shown; findings below may reference images not displayed]

FINDINGS: CT CHEST FINDINGS

Cardiovascular: Right chest port catheter. Normal heart size.
Three-vessel coronary artery calcifications no pericardial effusion.

Mediastinum/Nodes: No enlarged mediastinal, hilar, or axillary lymph
nodes. Thyroid gland, trachea, and esophagus demonstrate no
significant findings.

Lungs/Pleura: Mild, diffuse bilateral bronchial wall thickening.
Background of very fine centrilobular pulmonary nodules, most
concentrated in the lung apices. No pleural effusion or
pneumothorax.

Musculoskeletal: No chest wall mass or suspicious bone lesions
identified.

CT ABDOMEN PELVIS FINDINGS

Hepatobiliary: No solid liver abnormality is seen. Hemangioma of the
posterior right lobe of the liver with peripheral nodular contrast
enhancement, measuring 3.9 x 3.0 cm (series 504, image 53). Probable
additional small flash filling hemangioma of the inferior right lobe
of the liver (series 504, image 68). No gallstones, gallbladder wall
thickening, or biliary dilatation.

Pancreas: Unremarkable. No pancreatic ductal dilatation or
surrounding inflammatory changes.

Spleen: Normal in size without significant abnormality.

Adrenals/Urinary Tract: Adrenal glands are unremarkable. Kidneys are
normal, without renal calculi, solid lesion, or hydronephrosis.
Bladder is unremarkable.

Stomach/Bowel: Stomach is within normal limits. Appendix appears
normal. No evidence of bowel wall thickening, distention, or
inflammatory changes.

Vascular/Lymphatic: Aortic atherosclerosis. No enlarged abdominal or
pelvic lymph nodes.

Reproductive: Mild prostatomegaly.

Other: No abdominal wall hernia or abnormality. No abdominopelvic
ascites.

Musculoskeletal: No acute or significant osseous findings.
IMPRESSION: 1. No evidence of mass or lymphadenopathy in the chest, abdomen, or
pelvis.
2. Diffuse bilateral bronchial wall thickening and a background of
very fine centrilobular pulmonary nodules most concentrated in the
lung apices, these findings most commonly seen in smoking-related
respiratory bronchiolitis.
3. Coronary artery disease.
4. Mild prostatomegaly.

Aortic Atherosclerosis ([7N]-[7N]).

## 2021-06-25 IMAGING — CT CT ABD-PELV W/ CM
2 of 5 series · 13 of 36 positions shown, 16 images · IV contrast (ISOVUE 370)
Comparison: PET-CT, [DATE]

CLINICAL DATA: Nodular sclerosing Hodgkin's lymphoma, neck mass

EXAM:
CT CHEST, ABDOMEN, AND PELVIS WITH CONTRAST
TECHNIQUE: Multidetector CT imaging of the chest, abdomen and pelvis was
performed following the standard protocol during bolus
administration of intravenous contrast.
CONTRAST:  80mL OMNIPAQUE IOHEXOL 350 MG/ML SOLN, additional oral
enteric contrast

[Series 2: cap with · axial · 0.82mm/px · z∈[+1053,+1578]mm · 10 of 129 slices shown, 13 images]
[im 12/129  mediastinal]
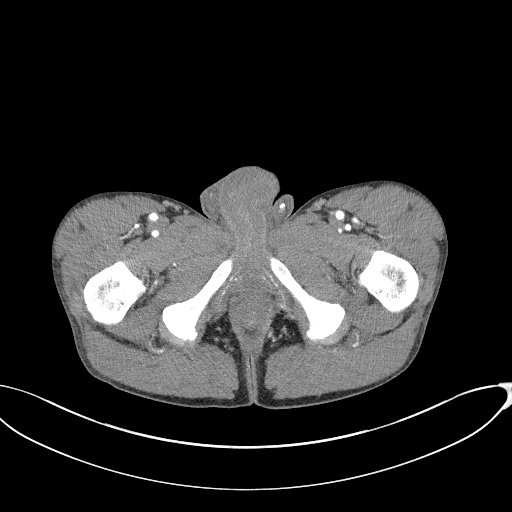
[im 12/129  lung]
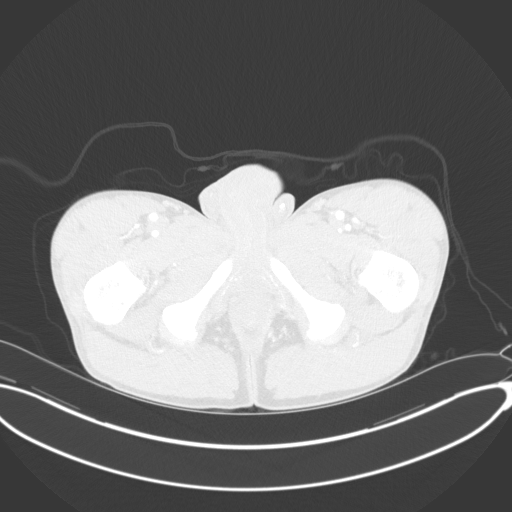
[im 24/129  lung]
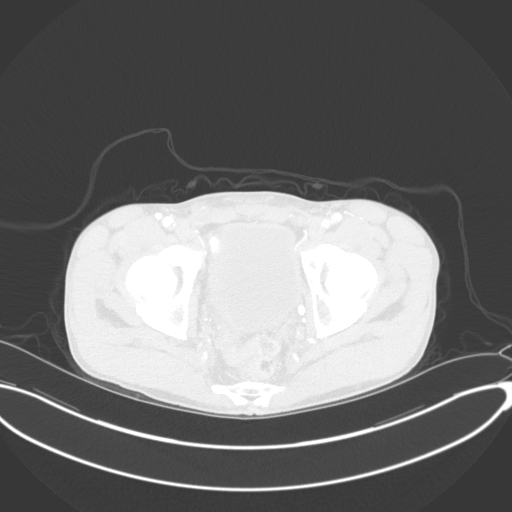
[im 35/129  lung]
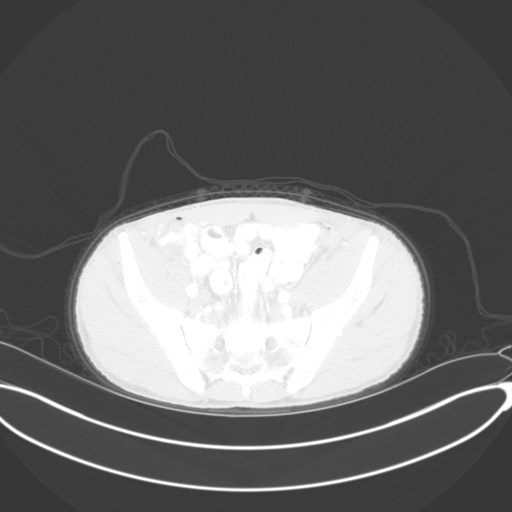
[im 47/129  lung]
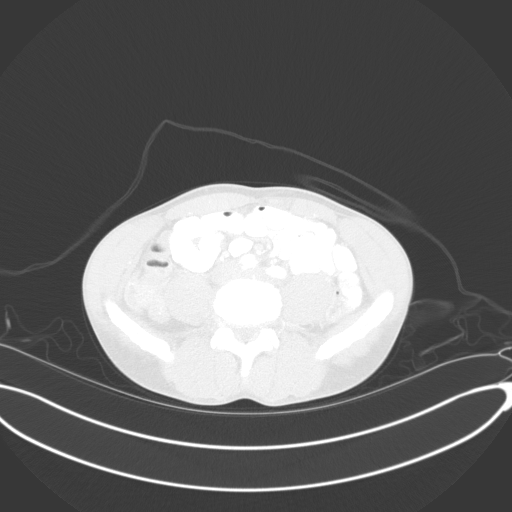
[im 59/129  mediastinal]
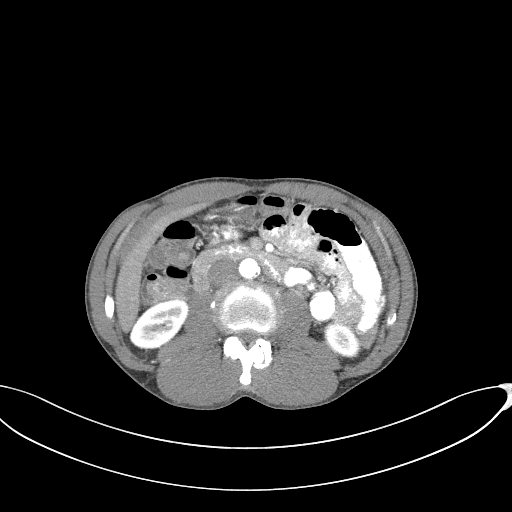
[im 59/129  lung]
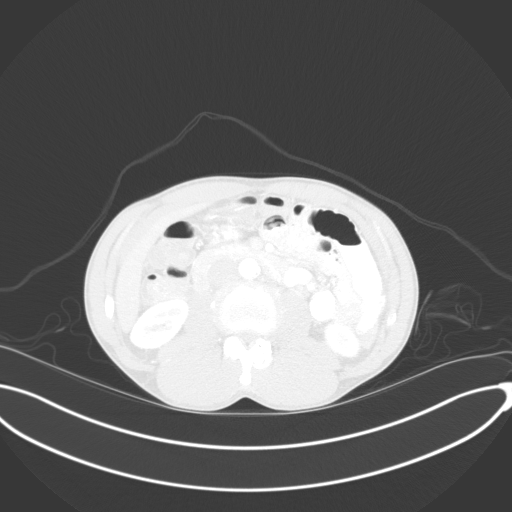
[im 70/129  lung]
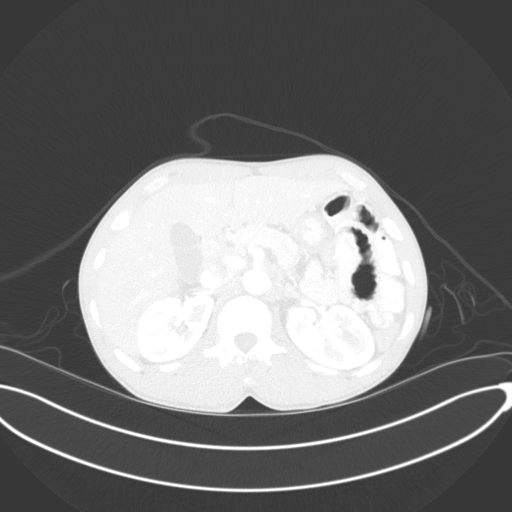
[im 82/129  lung]
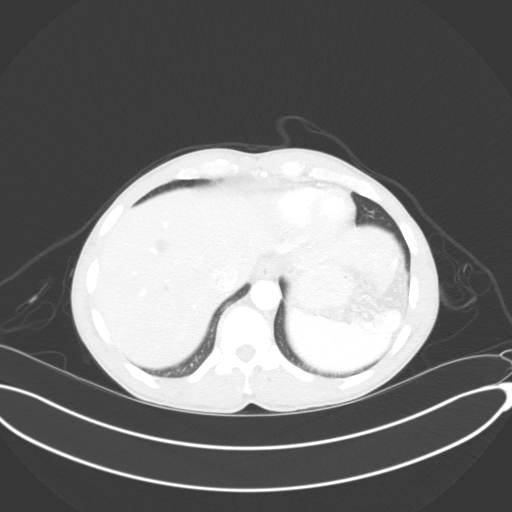
[im 94/129  lung]
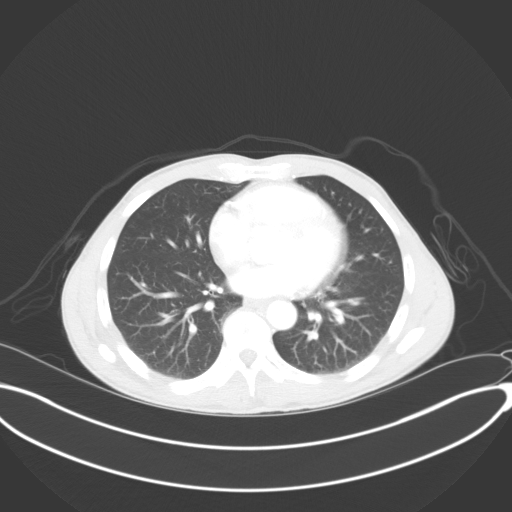
[im 105/129  mediastinal]
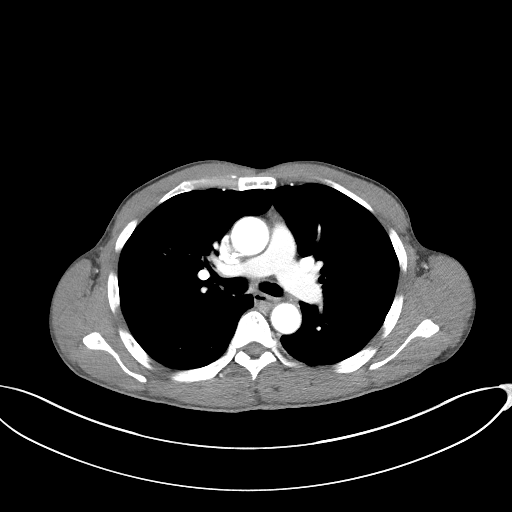
[im 105/129  lung]
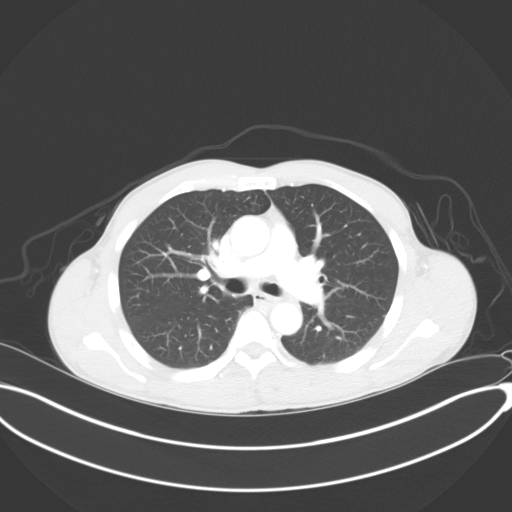
[im 117/129  lung]
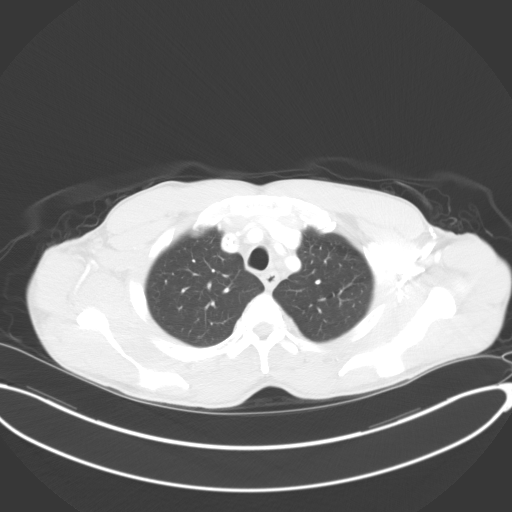

[Series 5: coronals · coronal · 0.82mm/px · 3 of 142 slices shown]
[im 29/142  lung]
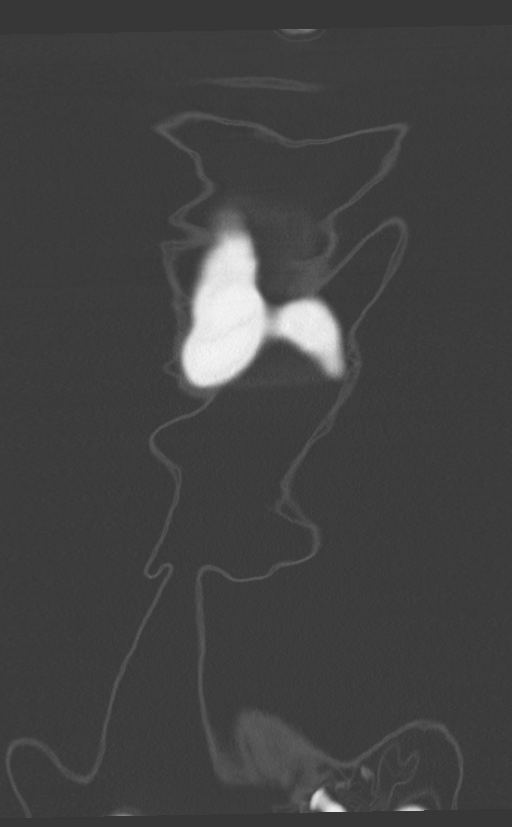
[im 57/142  lung]
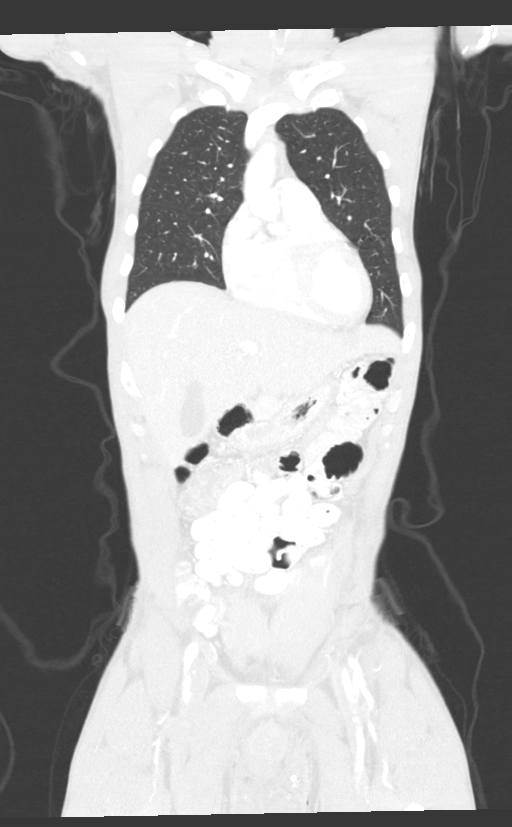
[im 85/142  lung]
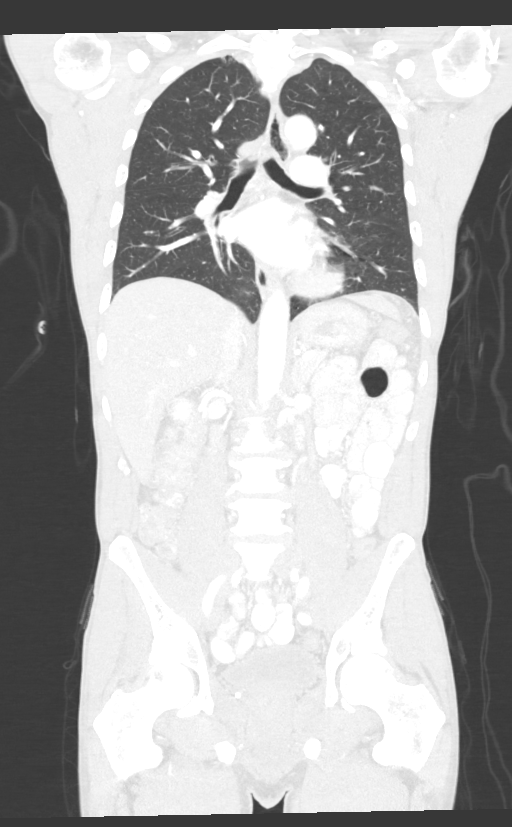

[13 of 36 positions shown; findings below may reference images not displayed]

FINDINGS: CT CHEST FINDINGS

Cardiovascular: Right chest port catheter. Normal heart size.
Three-vessel coronary artery calcifications no pericardial effusion.

Mediastinum/Nodes: No enlarged mediastinal, hilar, or axillary lymph
nodes. Thyroid gland, trachea, and esophagus demonstrate no
significant findings.

Lungs/Pleura: Mild, diffuse bilateral bronchial wall thickening.
Background of very fine centrilobular pulmonary nodules, most
concentrated in the lung apices. No pleural effusion or
pneumothorax.

Musculoskeletal: No chest wall mass or suspicious bone lesions
identified.

CT ABDOMEN PELVIS FINDINGS

Hepatobiliary: No solid liver abnormality is seen. Hemangioma of the
posterior right lobe of the liver with peripheral nodular contrast
enhancement, measuring 3.9 x 3.0 cm (series 504, image 53). Probable
additional small flash filling hemangioma of the inferior right lobe
of the liver (series 504, image 68). No gallstones, gallbladder wall
thickening, or biliary dilatation.

Pancreas: Unremarkable. No pancreatic ductal dilatation or
surrounding inflammatory changes.

Spleen: Normal in size without significant abnormality.

Adrenals/Urinary Tract: Adrenal glands are unremarkable. Kidneys are
normal, without renal calculi, solid lesion, or hydronephrosis.
Bladder is unremarkable.

Stomach/Bowel: Stomach is within normal limits. Appendix appears
normal. No evidence of bowel wall thickening, distention, or
inflammatory changes.

Vascular/Lymphatic: Aortic atherosclerosis. No enlarged abdominal or
pelvic lymph nodes.

Reproductive: Mild prostatomegaly.

Other: No abdominal wall hernia or abnormality. No abdominopelvic
ascites.

Musculoskeletal: No acute or significant osseous findings.
IMPRESSION: 1. No evidence of mass or lymphadenopathy in the chest, abdomen, or
pelvis.
2. Diffuse bilateral bronchial wall thickening and a background of
very fine centrilobular pulmonary nodules most concentrated in the
lung apices, these findings most commonly seen in smoking-related
respiratory bronchiolitis.
3. Coronary artery disease.
4. Mild prostatomegaly.

Aortic Atherosclerosis ([7N]-[7N]).

## 2021-06-25 MED ORDER — IOHEXOL 350 MG/ML SOLN
80.0000 mL | Freq: Once | INTRAVENOUS | Status: AC | PRN
  Administered 2021-06-25: 80 mL via INTRAVENOUS

## 2021-06-25 NOTE — Progress Notes (Signed)
Jesse [name] OFFICE PROGRESS NOTE  Jesse Drafts, FNP [address] [place] [id number]  DIAGNOSIS: Stage IVB Hodgkin's Lymphoma, nodular sclerosis subtype. He presented with a bulky left cervical and supraclavicular lymphadenopathy as well as involvement of the spleen, nodal disease in the porta hepatis, and extensive disease involving the marrow of the pelvis, spine, and ribs. He was diagnosed in September 2020.    IPS score: 4   PRIOR THERAPY: Systemic chemotherapy with brentuximab vedotin 1.2 mg/kg, doxorubicin 25 mg/m2, vinblastine 6 mg/m2, dacarbazine 375 mg/m2 on days 1 and 15 every 28 days. First dose expected on 05/30/2019.   Status post 6 cycles.   CURRENT THERAPY: Observation  INTERVAL HISTORY: Jesse [name] 55 y.o. male returns to the clinic today for a 73-month follow-up visit.  The patient was last seen in the clinic on 12/16/2020.  At that time, the patient had a newly enlarged right cervical lymph node which was recurrent concerning for disease recurrence so he had a PET scan performed which had shown no evidence of lymphoma recurrence.  It was recommended that the patient continue on observation.  The patient was recently evaluated by ENT, Dr. Redmond Baseman, who performed a CT of the neck on 05/21/2021 which showed mildly larger but more simple cystic mass in the right parotid tail, likely posttreatment cavitation of a lymphomatous node. He had this excised on 06/01/21 which showed a lymphoepithelial cyst involving the salivary gland. There was a small focus that raised the possibility of a cystic Warthin tumor; however, pathology felt this to be less likely.   Otherwise, he denies fevers, chills, or night sweats.  He has had a poor appetite recently and notes he drinks beer which does not have a lot of nutritional value. He is wondering if it is ok to drink boost/ensure. He is not a diabetic. Denies any signs or symptoms infection including nasal  congestion, cough, shortness of breath, skin infections, or dysuria. Denies early satiety or abdominal fullness. Denies bleeding or bruising. Denies other lymphadenopathy. He has a port-a-cath but no showed all of his port flush appointments the last 6 months. He notes sometimes he has muscle cramping in his hands and thinks he is dehydrated. He is here for evaluation and to review his scan results.   MEDICAL HISTORY: Past Medical History:  Diagnosis Date   Cancer ([place])    ETOH abuse    Lymphadenopathy    left   Marijuana abuse     ALLERGIES:  has No Known Allergies.  MEDICATIONS:  Current Outpatient Medications  Medication Sig Dispense Refill   gabapentin (NEURONTIN) 100 MG capsule TAKE 1 CAPSULE BY MOUTH 3 TIMES DAILY 90 capsule 2   lidocaine (LIDODERM) 5 % Place 1 patch onto the skin daily. Remove & Discard patch within 12 hours or as directed by MD 30 patch 0   lidocaine-prilocaine (EMLA) cream Apply 1 application topically as needed. Apply to port site  45 minutes prior to port being accessed (Patient taking differently: Apply 1 application topically as needed. Apply to port site  45 minutes prior to port being accessed) 30 g 1   methylPREDNISolone (MEDROL DOSEPAK) 4 MG TBPK tablet Per packet instructions 21 each 0   naproxen sodium (ALEVE) 220 MG tablet Take 660 mg by mouth daily as needed (pain).     oxyCODONE-acetaminophen (PERCOCET) 7.5-325 MG tablet Take 1 tablet by mouth every 6 (six) hours.     prochlorperazine (COMPAZINE) 10 MG tablet Take  1 tablet (10 mg total) by mouth every 8 (eight) hours as needed for nausea or vomiting. 30 tablet 2   No current facility-administered medications for this visit.   Facility-Administered Medications Ordered in Other Visits  Medication Dose Route Frequency Provider Last Rate Last Admin   sodium chloride flush (NS) 0.9 % injection 10 mL  10 mL Intracatheter PRN Curt Bears, MD   10 mL at 07/01/21 1551    SURGICAL HISTORY:  Past  Surgical History:  Procedure Laterality Date   HERNIA REPAIR     as child   IR IMAGING GUIDED PORT INSERTION  05/29/2019   LYMPH NODE BIOPSY Left 05/07/2019   Procedure: LYMPH NODE BIOPSY;  Surgeon: Melida Quitter, MD;  Location: [facility];  Service: ENT;  Laterality: Left;    REVIEW OF SYSTEMS:   Review of Systems  Constitutional: Positive for decreased appetite. Negative for chills, fatigue, fever and unexpected weight change.  HENT:   Negative for mouth sores, nosebleeds, sore throat and trouble swallowing.   Eyes: Negative for eye problems and icterus.  Respiratory: Negative for cough, hemoptysis, shortness of breath and wheezing.   Cardiovascular: Negative for chest pain and leg swelling.  Gastrointestinal: Negative for abdominal pain, constipation, diarrhea, nausea and vomiting.  Genitourinary: Negative for bladder incontinence, difficulty urinating, dysuria, frequency and hematuria.   Musculoskeletal: Negative for back pain, gait problem, neck pain and neck stiffness.  Skin: Negative for itching and rash.  Neurological: Negative for dizziness, extremity weakness, gait problem, headaches, light-headedness and seizures.  Hematological: Negative for adenopathy. Does not bruise/bleed easily.  Psychiatric/Behavioral: Negative for confusion, depression and sleep disturbance. The patient is not nervous/anxious.     PHYSICAL EXAMINATION:  Blood pressure 119/81, pulse 81, temperature 97.8 F (36.6 C), temperature source Tympanic, resp. rate 17, weight 135 lb 12.8 oz (61.6 kg), SpO2 100 %.  ECOG PERFORMANCE STATUS: 1  Physical Exam  Constitutional: Oriented to person, place, and time and well-developed, well-nourished, and in no distress.  HENT:  Head: Normocephalic and atraumatic.  Mouth/Throat: Oropharynx is clear and moist. No oropharyngeal exudate.  Eyes: Conjunctivae are normal. Right eye exhibits no discharge. Left eye exhibits no discharge. No scleral icterus.   Neck: Normal range of motion. Neck supple.  Cardiovascular: Normal rate, regular rhythm, normal heart sounds and intact distal pulses.   Pulmonary/Chest: Effort normal and breath sounds normal. No respiratory distress. No wheezes. No rales.  Abdominal: Soft. Bowel sounds are normal. Exhibits no distension and no mass. There is no tenderness.  Musculoskeletal: Normal range of motion. Exhibits no edema.  Lymphadenopathy:    No cervical adenopathy.  Neurological: Alert and oriented to person, place, and time. Exhibits normal muscle tone. Gait normal. Coordination normal.  Skin: Skin is warm and dry. No rash noted. Not diaphoretic. No erythema. No pallor.  Psychiatric: Mood, memory and judgment normal.  Vitals reviewed.  LABORATORY DATA: Lab Results  Component Value Date   WBC 2.8 (L) 12/01/2020   HGB 12.6 (L) 12/01/2020   HCT 38.1 (L) 12/01/2020   MCV 94.8 12/01/2020   PLT 269 12/01/2020      Chemistry      Component Value Date/Time   NA 135 12/01/2020 0320   K 3.8 12/01/2020 0320   CL 106 12/01/2020 0320   CO2 24 12/01/2020 0320   BUN 13 12/01/2020 0320   CREATININE 0.79 12/01/2020 0320   CREATININE 0.91 11/18/2020 0934      Component Value Date/Time   CALCIUM 8.9 12/01/2020 0320  ALKPHOS 69 12/01/2020 0320   AST 22 12/01/2020 0320   AST 17 11/18/2020 0934   ALT 19 12/01/2020 0320   ALT 21 11/18/2020 0934   BILITOT 0.8 12/01/2020 0320   BILITOT 0.5 11/18/2020 0934       RADIOGRAPHIC STUDIES:  CT Chest W Contrast  Result Date: 06/26/2021 CLINICAL DATA:  Nodular sclerosing Hodgkin's lymphoma, neck mass EXAM: CT CHEST, ABDOMEN, AND PELVIS WITH CONTRAST TECHNIQUE: Multidetector CT imaging of the chest, abdomen and pelvis was performed following the standard protocol during bolus administration of intravenous contrast. CONTRAST:  22mL OMNIPAQUE IOHEXOL 350 MG/ML SOLN, additional oral enteric contrast COMPARISON:  PET-CT, 12/10/2020 FINDINGS: CT CHEST FINDINGS  Cardiovascular: Right chest port catheter. Normal heart size. Three-vessel coronary artery calcifications no pericardial effusion. Mediastinum/Nodes: No enlarged mediastinal, hilar, or axillary lymph nodes. Thyroid gland, trachea, and esophagus demonstrate no significant findings. Lungs/Pleura: Mild, diffuse bilateral bronchial wall thickening. Background of very fine centrilobular pulmonary nodules, most concentrated in the lung apices. No pleural effusion or pneumothorax. Musculoskeletal: No chest wall mass or suspicious bone lesions identified. CT ABDOMEN PELVIS FINDINGS Hepatobiliary: No solid liver abnormality is seen. Hemangioma of the posterior right lobe of the liver with peripheral nodular contrast enhancement, measuring 3.9 x 3.0 cm (series 504, image 53). Probable additional small flash filling hemangioma of the inferior right lobe of the liver (series 504, image 68). No gallstones, gallbladder wall thickening, or biliary dilatation. Pancreas: Unremarkable. No pancreatic ductal dilatation or surrounding inflammatory changes. Spleen: Normal in size without significant abnormality. Adrenals/Urinary Tract: Adrenal glands are unremarkable. Kidneys are normal, without renal calculi, solid lesion, or hydronephrosis. Bladder is unremarkable. Stomach/Bowel: Stomach is within normal limits. Appendix appears normal. No evidence of bowel wall thickening, distention, or inflammatory changes. Vascular/Lymphatic: Aortic atherosclerosis. No enlarged abdominal or pelvic lymph nodes. Reproductive: Mild prostatomegaly. Other: No abdominal wall hernia or abnormality. No abdominopelvic ascites. Musculoskeletal: No acute or significant osseous findings. IMPRESSION: 1. No evidence of mass or lymphadenopathy in the chest, abdomen, or pelvis. 2. Diffuse bilateral bronchial wall thickening and a background of very fine centrilobular pulmonary nodules most concentrated in the lung apices, these findings most commonly seen in  smoking-related respiratory bronchiolitis. 3. Coronary artery disease. 4. Mild prostatomegaly. Aortic Atherosclerosis (ICD10-I70.0). Electronically Signed   By: Delanna Ahmadi M.D.   On: 06/26/2021 07:15   CT Abdomen Pelvis W Contrast  Result Date: 06/26/2021 CLINICAL DATA:  Nodular sclerosing Hodgkin's lymphoma, neck mass EXAM: CT CHEST, ABDOMEN, AND PELVIS WITH CONTRAST TECHNIQUE: Multidetector CT imaging of the chest, abdomen and pelvis was performed following the standard protocol during bolus administration of intravenous contrast. CONTRAST:  30mL OMNIPAQUE IOHEXOL 350 MG/ML SOLN, additional oral enteric contrast COMPARISON:  PET-CT, 12/10/2020 FINDINGS: CT CHEST FINDINGS Cardiovascular: Right chest port catheter. Normal heart size. Three-vessel coronary artery calcifications no pericardial effusion. Mediastinum/Nodes: No enlarged mediastinal, hilar, or axillary lymph nodes. Thyroid gland, trachea, and esophagus demonstrate no significant findings. Lungs/Pleura: Mild, diffuse bilateral bronchial wall thickening. Background of very fine centrilobular pulmonary nodules, most concentrated in the lung apices. No pleural effusion or pneumothorax. Musculoskeletal: No chest wall mass or suspicious bone lesions identified. CT ABDOMEN PELVIS FINDINGS Hepatobiliary: No solid liver abnormality is seen. Hemangioma of the posterior right lobe of the liver with peripheral nodular contrast enhancement, measuring 3.9 x 3.0 cm (series 504, image 53). Probable additional small flash filling hemangioma of the inferior right lobe of the liver (series 504, image 68). No gallstones, gallbladder wall thickening, or biliary dilatation. Pancreas: Unremarkable. No  pancreatic ductal dilatation or surrounding inflammatory changes. Spleen: Normal in size without significant abnormality. Adrenals/Urinary Tract: Adrenal glands are unremarkable. Kidneys are normal, without renal calculi, solid lesion, or hydronephrosis. Bladder is  unremarkable. Stomach/Bowel: Stomach is within normal limits. Appendix appears normal. No evidence of bowel wall thickening, distention, or inflammatory changes. Vascular/Lymphatic: Aortic atherosclerosis. No enlarged abdominal or pelvic lymph nodes. Reproductive: Mild prostatomegaly. Other: No abdominal wall hernia or abnormality. No abdominopelvic ascites. Musculoskeletal: No acute or significant osseous findings. IMPRESSION: 1. No evidence of mass or lymphadenopathy in the chest, abdomen, or pelvis. 2. Diffuse bilateral bronchial wall thickening and a background of very fine centrilobular pulmonary nodules most concentrated in the lung apices, these findings most commonly seen in smoking-related respiratory bronchiolitis. 3. Coronary artery disease. 4. Mild prostatomegaly. Aortic Atherosclerosis (ICD10-I70.0). Electronically Signed   By: Delanna Ahmadi M.D.   On: 06/26/2021 07:15     ASSESSMENT/PLAN:  This is a very pleasant 55 years old African-American male diagnosed with a stage IV Hodgkin lymphoma, nodular sclerosing subtype. He presented with a bulky left cervical and supraclavicular lymphadenopathy as well as involvement of the spleen, nodal disease in the porta hepatis, and extensive disease involving the marrow of the pelvis, spine, and ribs. He was diagnosed in September 2020.   He underwent systemic chemotherapy with brentuximab Vedotin in addition to doxorubicin, vinblastine and dacarbazine.  Status post 6 cycles. His last dose was in October 2021.    His last PET scan after the treatment showed almost complete resolution of his disease with no hypermetabolic activity in the neck, chest, abdomen pelvis or the osseous structures.  He has Deauville1. The patient is currently on observation and he is feeling fine today with no concerning complaints.  In February 2022, the patient noted a new non-tender right cervical lymph node. He recently had a CT chest and neck to further evaluate this. The  scan showed interval development of necrotic lymph node in the right mid neck measuring 17 mm. This is most compatible with recurrent tumor. However, he had a PET scan to evaluate this which did not show any evidence of lymphoma recurrence on FDG PET scan.   Patient recently had a restaging CT of the neck, chest, abdomen, and pelvis.  The patient was seen with Dr. Julien Nordmann today.  Dr. Julien Nordmann personally and independently reviewed the scan discussed results with the patient today.  The scan showed  no evidence of recurrent disease. Dr. Julien Nordmann recommends that the patient continue on observation with a restaging CT scan in 6 months.  I will arrange for the patient to have a Port-A-Cath flush every 6 weeks. He has not had it flushed in some time. We will arrange for it to be flushed today and for him to have his routine labs of a CBC, CMP, and LDH. Will assess for electrolyte abnormalities due to complaints of muscle cramping.   The patient was given coupons for ensure. Advised to increase his caloric intake with foods with good nutritional value, unlike beer. His scan noted smoking related changes in the lung. He does not smoke cigarettes but admitted to smoking marijuana.  Encouraged him to cut back/quit smoking and drinking beer.  The patient was advised to call immediately if he has any concerning symptoms in the interval. The patient voices understanding of current disease status and treatment options and is in agreement with the current care plan. All questions were answered. The patient knows to call the clinic with any problems, questions or concerns.  We can certainly see the patient much sooner if necessary   Orders Placed This Encounter  Procedures   CT Abdomen Pelvis W Contrast    Standing Status:   Future    Standing Expiration Date:   07/01/2022    Order Specific Question:   If indicated for the ordered procedure, I authorize the administration of contrast media per Radiology protocol     Answer:   Yes    Order Specific Question:   Preferred imaging location?    Answer:   [facility]    Order Specific Question:   Is Oral Contrast requested for this exam?    Answer:   Yes, Per Radiology protocol   CT Chest W Contrast    Standing Status:   Future    Standing Expiration Date:   07/01/2022    Order Specific Question:   If indicated for the ordered procedure, I authorize the administration of contrast media per Radiology protocol    Answer:   Yes    Order Specific Question:   Preferred imaging location?    Answer:   [facility]   CT Soft Tissue Neck W Contrast    Standing Status:   Future    Standing Expiration Date:   07/01/2022    Order Specific Question:   If indicated for the ordered procedure, I authorize the administration of contrast media per Radiology protocol    Answer:   Yes    Order Specific Question:   Preferred imaging location?    Answer:   [facility]   CBC with Differential ([place] Only)    Standing Status:   Future    Number of Occurrences:   1    Standing Expiration Date:   07/01/2022   CMP ([place] only)    Standing Status:   Future    Number of Occurrences:   1    Standing Expiration Date:   07/01/2022   Lactate dehydrogenase (LDH)    Standing Status:   Future    Number of Occurrences:   1    Standing Expiration Date:   07/01/2022       Tobe Sos Jerral Mccauley, PA-C 07/01/21  ADDENDUM: Hematology/Oncology Attending: I had a face-to-face encounter with the patient today.  I reviewed his record, lab, scan and recommended his care plan.  This is a very pleasant 55 years old African-American male with history of stage IV Hodgkin's lymphoma, nodular sclerosing subtype diagnosed in September 2020 status post systemic chemotherapy with brentuximab vedotin in addition to doxorubicin, vinblastine and dacarbazine for 6 cycles with significant improvement almost complete response of his disease completed October  2021. The patient has been in observation since that time and he is feeling fine. He had repeat CT scan of the neck, chest, abdomen pelvis performed recently.  I personally and independently reviewed the scans and discussed the results with the patient today.  His scan showed no concerning findings for disease recurrence or metastasis.  The suspicious lesion in his neck was consistent with squamous epithelium lined cyst and he is followed by Dr. Redmond Baseman. I recommended for him to continue on observation with repeat CT scan of the neck, chest, abdomen pelvis in 6 months. Will also arrange for the patient to have Port-A-Cath flush every 2 months. The patient was advised to call immediately if he has any other concerning symptoms in the interval. Disclaimer: This note was dictated with voice recognition software. Similar sounding words can inadvertently be transcribed  and may be missed upon review. Eilleen Kempf, MD 07/01/21

## 2021-07-01 ENCOUNTER — Inpatient Hospital Stay: Payer: Medicaid Other

## 2021-07-01 ENCOUNTER — Other Ambulatory Visit: Payer: Self-pay | Admitting: *Deleted

## 2021-07-01 ENCOUNTER — Other Ambulatory Visit: Payer: Self-pay

## 2021-07-01 ENCOUNTER — Inpatient Hospital Stay: Payer: Medicaid Other | Attending: Medical | Admitting: Physician Assistant

## 2021-07-01 VITALS — BP 119/81 | HR 81 | Temp 97.8°F | Resp 17 | Wt 135.8 lb

## 2021-07-01 DIAGNOSIS — D1803 Hemangioma of intra-abdominal structures: Secondary | ICD-10-CM | POA: Insufficient documentation

## 2021-07-01 DIAGNOSIS — C8111 Nodular sclerosis classical Hodgkin lymphoma, lymph nodes of head, face, and neck: Secondary | ICD-10-CM | POA: Diagnosis not present

## 2021-07-01 DIAGNOSIS — Z79899 Other long term (current) drug therapy: Secondary | ICD-10-CM | POA: Diagnosis not present

## 2021-07-01 DIAGNOSIS — N400 Enlarged prostate without lower urinary tract symptoms: Secondary | ICD-10-CM | POA: Diagnosis not present

## 2021-07-01 DIAGNOSIS — I700 Atherosclerosis of aorta: Secondary | ICD-10-CM | POA: Insufficient documentation

## 2021-07-01 DIAGNOSIS — Z9221 Personal history of antineoplastic chemotherapy: Secondary | ICD-10-CM | POA: Insufficient documentation

## 2021-07-01 DIAGNOSIS — R918 Other nonspecific abnormal finding of lung field: Secondary | ICD-10-CM | POA: Insufficient documentation

## 2021-07-01 DIAGNOSIS — I2510 Atherosclerotic heart disease of native coronary artery without angina pectoris: Secondary | ICD-10-CM | POA: Diagnosis not present

## 2021-07-01 DIAGNOSIS — R590 Localized enlarged lymph nodes: Secondary | ICD-10-CM | POA: Insufficient documentation

## 2021-07-01 DIAGNOSIS — R252 Cramp and spasm: Secondary | ICD-10-CM | POA: Insufficient documentation

## 2021-07-01 DIAGNOSIS — Z7952 Long term (current) use of systemic steroids: Secondary | ICD-10-CM | POA: Diagnosis not present

## 2021-07-01 DIAGNOSIS — F17200 Nicotine dependence, unspecified, uncomplicated: Secondary | ICD-10-CM | POA: Diagnosis not present

## 2021-07-01 DIAGNOSIS — Z95828 Presence of other vascular implants and grafts: Secondary | ICD-10-CM

## 2021-07-01 LAB — CBC WITH DIFFERENTIAL (CANCER CENTER ONLY)
Abs Immature Granulocytes: 0 10*3/uL (ref 0.00–0.07)
Basophils Absolute: 0 10*3/uL (ref 0.0–0.1)
Basophils Relative: 1 %
Eosinophils Absolute: 0 10*3/uL (ref 0.0–0.5)
Eosinophils Relative: 1 %
HCT: 38.9 % — ABNORMAL LOW (ref 39.0–52.0)
Hemoglobin: 13.2 g/dL (ref 13.0–17.0)
Immature Granulocytes: 0 %
Lymphocytes Relative: 42 %
Lymphs Abs: 1.6 10*3/uL (ref 0.7–4.0)
MCH: 31.4 pg (ref 26.0–34.0)
MCHC: 33.9 g/dL (ref 30.0–36.0)
MCV: 92.4 fL (ref 80.0–100.0)
Monocytes Absolute: 0.4 10*3/uL (ref 0.1–1.0)
Monocytes Relative: 10 %
Neutro Abs: 1.8 10*3/uL (ref 1.7–7.7)
Neutrophils Relative %: 46 %
Platelet Count: 265 10*3/uL (ref 150–400)
RBC: 4.21 MIL/uL — ABNORMAL LOW (ref 4.22–5.81)
RDW: 12.5 % (ref 11.5–15.5)
WBC Count: 3.9 10*3/uL — ABNORMAL LOW (ref 4.0–10.5)
nRBC: 0 % (ref 0.0–0.2)

## 2021-07-01 LAB — CMP (CANCER CENTER ONLY)
ALT: 25 U/L (ref 0–44)
AST: 25 U/L (ref 15–41)
Albumin: 4 g/dL (ref 3.5–5.0)
Alkaline Phosphatase: 78 U/L (ref 38–126)
Anion gap: 11 (ref 5–15)
BUN: 20 mg/dL (ref 6–20)
CO2: 25 mmol/L (ref 22–32)
Calcium: 9 mg/dL (ref 8.9–10.3)
Chloride: 103 mmol/L (ref 98–111)
Creatinine: 0.83 mg/dL (ref 0.61–1.24)
GFR, Estimated: >60 mL/min (ref >60)
Glucose, Bld: 81 mg/dL (ref 70–99)
Potassium: 3.9 mmol/L (ref 3.5–5.1)
Sodium: 139 mmol/L (ref 135–145)
Total Bilirubin: 0.2 mg/dL — ABNORMAL LOW (ref 0.3–1.2)
Total Protein: 7.2 g/dL (ref 6.5–8.1)

## 2021-07-01 LAB — LACTATE DEHYDROGENASE: LDH: 207 U/L — ABNORMAL HIGH (ref 98–192)

## 2021-07-01 MED ORDER — SODIUM CHLORIDE 0.9% FLUSH
10.0000 mL | INTRAVENOUS | Status: AC | PRN
  Administered 2021-07-01: 10 mL

## 2021-07-01 MED ORDER — HEPARIN SOD (PORK) LOCK FLUSH 100 UNIT/ML IV SOLN
500.0000 [IU] | Freq: Once | INTRAVENOUS | Status: AC | PRN
  Administered 2021-07-01: 500 [IU]

## 2021-08-20 ENCOUNTER — Inpatient Hospital Stay: Payer: Medicaid Other | Attending: Medical

## 2021-08-20 DIAGNOSIS — C8111 Nodular sclerosis classical Hodgkin lymphoma, lymph nodes of head, face, and neck: Secondary | ICD-10-CM | POA: Insufficient documentation

## 2021-08-20 DIAGNOSIS — Z452 Encounter for adjustment and management of vascular access device: Secondary | ICD-10-CM | POA: Insufficient documentation

## 2021-09-10 ENCOUNTER — Inpatient Hospital Stay: Payer: Medicaid Other

## 2021-09-10 ENCOUNTER — Other Ambulatory Visit: Payer: Self-pay

## 2021-09-10 DIAGNOSIS — C8111 Nodular sclerosis classical Hodgkin lymphoma, lymph nodes of head, face, and neck: Secondary | ICD-10-CM | POA: Diagnosis present

## 2021-09-10 DIAGNOSIS — Z452 Encounter for adjustment and management of vascular access device: Secondary | ICD-10-CM | POA: Diagnosis not present

## 2021-09-10 DIAGNOSIS — Z95828 Presence of other vascular implants and grafts: Secondary | ICD-10-CM

## 2021-09-10 MED ORDER — HEPARIN SOD (PORK) LOCK FLUSH 100 UNIT/ML IV SOLN
500.0000 [IU] | Freq: Once | INTRAVENOUS | Status: AC | PRN
Start: 2021-09-10 — End: 2021-09-10
  Administered 2021-09-10: 500 [IU]

## 2021-09-10 MED ORDER — SODIUM CHLORIDE 0.9% FLUSH
10.0000 mL | INTRAVENOUS | Status: DC | PRN
Start: 2021-09-10 — End: 2021-09-10
  Administered 2021-09-10: 10 mL

## 2021-10-08 ENCOUNTER — Inpatient Hospital Stay: Payer: Medicaid Other | Attending: Medical

## 2021-11-26 ENCOUNTER — Inpatient Hospital Stay: Payer: Medicaid Other

## 2021-11-26 ENCOUNTER — Telehealth: Payer: Self-pay | Admitting: Internal Medicine

## 2021-11-26 NOTE — Telephone Encounter (Signed)
Per 4/13 in basket called pt and spoke to pt spouse.  Pt spouse confirmed appointment  ?

## 2021-11-30 ENCOUNTER — Other Ambulatory Visit: Payer: Self-pay

## 2021-11-30 ENCOUNTER — Inpatient Hospital Stay: Payer: Medicaid Other | Attending: Medical

## 2021-11-30 DIAGNOSIS — C8111 Nodular sclerosis classical Hodgkin lymphoma, lymph nodes of head, face, and neck: Secondary | ICD-10-CM | POA: Insufficient documentation

## 2021-11-30 DIAGNOSIS — Z95828 Presence of other vascular implants and grafts: Secondary | ICD-10-CM

## 2021-11-30 DIAGNOSIS — Z452 Encounter for adjustment and management of vascular access device: Secondary | ICD-10-CM | POA: Diagnosis not present

## 2021-11-30 MED ORDER — HEPARIN SOD (PORK) LOCK FLUSH 100 UNIT/ML IV SOLN
500.0000 [IU] | Freq: Once | INTRAVENOUS | Status: AC | PRN
  Administered 2021-11-30: 500 [IU]

## 2021-11-30 MED ORDER — SODIUM CHLORIDE 0.9% FLUSH
10.0000 mL | INTRAVENOUS | Status: DC | PRN
  Administered 2021-11-30: 10 mL

## 2022-01-05 ENCOUNTER — Inpatient Hospital Stay: Payer: Medicaid Other | Attending: Medical

## 2022-01-05 ENCOUNTER — Ambulatory Visit (HOSPITAL_COMMUNITY): Admission: RE | Admit: 2022-01-05 | Payer: Medicaid Other | Source: Ambulatory Visit

## 2022-01-05 ENCOUNTER — Ambulatory Visit (HOSPITAL_COMMUNITY): Payer: Medicaid Other

## 2022-01-06 ENCOUNTER — Telehealth: Payer: Self-pay | Admitting: Physician Assistant

## 2022-01-06 NOTE — Telephone Encounter (Signed)
The patient missed his CT appointment today. I talked to his wife and gave her instructions on how to reschedule his CT scan. She will call first thing in the morning. I have cancelled his appointment with Dr. Julien Nordmann tomorrow and will reschedule it a few days after the scan is completed.

## 2022-01-07 ENCOUNTER — Inpatient Hospital Stay: Payer: Medicaid Other | Admitting: Internal Medicine

## 2022-01-08 ENCOUNTER — Telehealth: Payer: Self-pay | Admitting: Physician Assistant

## 2022-01-08 NOTE — Telephone Encounter (Signed)
.  Called patient to schedule appointment per 5/25 inbasket, patient is aware of date and time.   

## 2022-01-15 NOTE — Progress Notes (Deleted)
.[place] OFFICE PROGRESS NOTE  Vonna Drafts, FNP [address] [place] [id number]  DIAGNOSIS:  Stage IVB Hodgkin's Lymphoma, nodular sclerosis subtype. He presented with a bulky left cervical and supraclavicular lymphadenopathy as well as involvement of the spleen, nodal disease in the porta hepatis, and extensive disease involving the marrow of the pelvis, spine, and ribs. He was diagnosed in September 2020.    IPS score: 4   PRIOR THERAPY: Systemic chemotherapy with brentuximab vedotin 1.2 mg/kg, doxorubicin 25 mg/m2, vinblastine 6 mg/m2, dacarbazine 375 mg/m2 on days 1 and 15 every 28 days. First dose expected on 05/30/2019.   Status post 6 cycles.   CURRENT THERAPY:  Observation  INTERVAL HISTORY: Jesse [name] 56 y.o. male returns to the clinic today for a 43-monthfollow-up visit.  The patient was last seen in the clinic on 12/16/2020.  At that time, the patient had a newly enlarged right cervical lymph node which was recurrent concerning for disease recurrence so he had a PET scan performed which had shown no evidence of lymphoma recurrence.  It was recommended that the patient continue on observation.  The patient was recently evaluated by ENT, Dr. BRedmond Baseman who performed a CT of the neck on 05/21/2021 which showed mildly larger but more simple cystic mass in the right parotid tail, likely posttreatment cavitation of a lymphomatous node. He had this excised on 06/01/21 which showed a lymphoepithelial cyst involving the salivary gland. There was a small focus that raised the possibility of a cystic Warthin tumor; however, pathology felt this to be less likely.    Otherwise, he denies fevers, chills, or night sweats.  He has had a poor appetite recently and notes he drinks beer which does not have a lot of nutritional value.*** He is wondering if it is ok to drink boost/ensure. He is not a diabetic. Denies any signs or symptoms infection including  nasal congestion, cough, shortness of breath, skin infections, or dysuria. Denies early satiety or abdominal fullness. Denies bleeding or bruising. Denies other lymphadenopathy. He has a port-a-cath but no showed several of his port flush appointments the last 6 months***. Most recent port flush in April***?  He is here for evaluation and to review his scan results.    MEDICAL HISTORY: Past Medical History:  Diagnosis Date   Cancer ([facility]    ETOH abuse    Lymphadenopathy    left   Marijuana abuse     ALLERGIES:  has No Known Allergies.  MEDICATIONS:  Current Outpatient Medications  Medication Sig Dispense Refill   gabapentin (NEURONTIN) 100 MG capsule TAKE 1 CAPSULE BY MOUTH 3 TIMES DAILY 90 capsule 2   lidocaine (LIDODERM) 5 % Place 1 patch onto the skin daily. Remove & Discard patch within 12 hours or as directed by MD 30 patch 0   lidocaine-prilocaine (EMLA) cream Apply 1 application topically as needed. Apply to port site  45 minutes prior to port being accessed (Patient taking differently: Apply 1 application topically as needed. Apply to port site  45 minutes prior to port being accessed) 30 g 1   methylPREDNISolone (MEDROL DOSEPAK) 4 MG TBPK tablet Per packet instructions 21 each 0   naproxen sodium (ALEVE) 220 MG tablet Take 660 mg by mouth daily as needed (pain).     oxyCODONE-acetaminophen (PERCOCET) 7.5-325 MG tablet Take 1 tablet by mouth every 6 (six) hours.     prochlorperazine (COMPAZINE) 10 MG tablet Take 1 tablet (10 mg  total) by mouth every 8 (eight) hours as needed for nausea or vomiting. 30 tablet 2   No current facility-administered medications for this visit.   Facility-Administered Medications Ordered in Other Visits  Medication Dose Route Frequency Provider Last Rate Last Admin   sodium chloride flush (NS) 0.9 % injection 10 mL  10 mL Intracatheter PRN Curt Bears, MD   10 mL at 07/01/21 1551    SURGICAL HISTORY:  Past Surgical History:  Procedure  Laterality Date   HERNIA REPAIR     as child   IR IMAGING GUIDED PORT INSERTION  05/29/2019   LYMPH NODE BIOPSY Left 05/07/2019   Procedure: LYMPH NODE BIOPSY;  Surgeon: Melida Quitter, MD;  Location: [facility];  Service: ENT;  Laterality: Left;    REVIEW OF SYSTEMS:   Review of Systems  Constitutional: Negative for appetite change, chills, fatigue, fever and unexpected weight change.  HENT:   Negative for mouth sores, nosebleeds, sore throat and trouble swallowing.   Eyes: Negative for eye problems and icterus.  Respiratory: Negative for cough, hemoptysis, shortness of breath and wheezing.   Cardiovascular: Negative for chest pain and leg swelling.  Gastrointestinal: Negative for abdominal pain, constipation, diarrhea, nausea and vomiting.  Genitourinary: Negative for bladder incontinence, difficulty urinating, dysuria, frequency and hematuria.   Musculoskeletal: Negative for back pain, gait problem, neck pain and neck stiffness.  Skin: Negative for itching and rash.  Neurological: Negative for dizziness, extremity weakness, gait problem, headaches, light-headedness and seizures.  Hematological: Negative for adenopathy. Does not bruise/bleed easily.  Psychiatric/Behavioral: Negative for confusion, depression and sleep disturbance. The patient is not nervous/anxious.     PHYSICAL EXAMINATION:  There were no vitals taken for this visit.  ECOG PERFORMANCE STATUS: {CHL ONC ECOG [id number]  Physical Exam  Constitutional: Oriented to person, place, and time and well-developed, well-nourished, and in no distress. No distress.  HENT:  Head: Normocephalic and atraumatic.  Mouth/Throat: Oropharynx is clear and moist. No oropharyngeal exudate.  Eyes: Conjunctivae are normal. Right eye exhibits no discharge. Left eye exhibits no discharge. No scleral icterus.  Neck: Normal range of motion. Neck supple.  Cardiovascular: Normal rate, regular rhythm, normal heart sounds and  intact distal pulses.   Pulmonary/Chest: Effort normal and breath sounds normal. No respiratory distress. No wheezes. No rales.  Abdominal: Soft. Bowel sounds are normal. Exhibits no distension and no mass. There is no tenderness.  Musculoskeletal: Normal range of motion. Exhibits no edema.  Lymphadenopathy:    No cervical adenopathy.  Neurological: Alert and oriented to person, place, and time. Exhibits normal muscle tone. Gait normal. Coordination normal.  Skin: Skin is warm and dry. No rash noted. Not diaphoretic. No erythema. No pallor.  Psychiatric: Mood, memory and judgment normal.  Vitals reviewed.  LABORATORY DATA: Lab Results  Component Value Date   WBC 3.9 (L) 07/01/2021   HGB 13.2 07/01/2021   HCT 38.9 (L) 07/01/2021   MCV 92.4 07/01/2021   PLT 265 07/01/2021      Chemistry      Component Value Date/Time   NA 139 07/01/2021 1601   K 3.9 07/01/2021 1601   CL 103 07/01/2021 1601   CO2 25 07/01/2021 1601   BUN 20 07/01/2021 1601   CREATININE 0.83 07/01/2021 1601      Component Value Date/Time   CALCIUM 9.0 07/01/2021 1601   ALKPHOS 78 07/01/2021 1601   AST 25 07/01/2021 1601   ALT 25 07/01/2021 1601   BILITOT 0.2 (L) 07/01/2021 1601  RADIOGRAPHIC STUDIES:  No results found.   ASSESSMENT/PLAN:  This is a very pleasant 56 years old African-American male diagnosed with a stage IV Hodgkin lymphoma, nodular sclerosing subtype. He presented with a bulky left cervical and supraclavicular lymphadenopathy as well as involvement of the spleen, nodal disease in the porta hepatis, and extensive disease involving the marrow of the pelvis, spine, and ribs. He was diagnosed in September 2020.   He underwent systemic chemotherapy with brentuximab Vedotin in addition to doxorubicin, vinblastine and dacarbazine.  Status post 6 cycles. His last dose was in October 2021.   His last PET scan after the treatment showed almost complete resolution of his disease with no  hypermetabolic activity in the neck, chest, abdomen pelvis or the osseous structures.  He has Deauville1. The patient is currently on observation and he is feeling fine today with no concerning complaints.  In February 2022, the patient noted a new non-tender right cervical lymph node. He recently had a CT chest and neck to further evaluate this. The scan showed interval development of necrotic lymph node in the right mid neck measuring 17 mm. This is most compatible with recurrent tumor. However, he had a PET scan to evaluate this which did not show any evidence of lymphoma recurrence on FDG PET scan.   The patient recently had a restaging CT of the neck, chest, abdomen, pelvis.  The patient was seen with Dr. Julien Nordmann today.  Dr. Julien Nordmann personally independently reviewed the scan and discussed results with patient today.  The scan did not show any evidence of recurrent disease.  Dr. Julien Nordmann recommended that the patient continue on observation with a restaging CT scan in 6 months.  I will arrange for the patient to have a Port-A-Cath flush every 6 weeks.  The patient was advised to call immediately if he has any concerning symptoms in the interval. The patient voices understanding of current disease status and treatment options and is in agreement with the current care plan. All questions were answered. The patient knows to call the clinic with any problems, questions or concerns. We can certainly see the patient much sooner if necessary         No orders of the defined types were placed in this encounter.    I spent {CHL ONC TIME VISIT - NFAOZ:[id number]} counseling the patient face to face. The total time spent in the appointment was {CHL ONC TIME VISIT - GEXBM:[id number]}.  Yesli Vanderhoff L Shellye Zandi, PA-C 01/18/22

## 2022-01-19 ENCOUNTER — Inpatient Hospital Stay: Payer: Medicaid Other

## 2022-01-19 ENCOUNTER — Ambulatory Visit (HOSPITAL_COMMUNITY): Payer: Medicaid Other

## 2022-01-26 ENCOUNTER — Ambulatory Visit: Payer: Medicaid Other | Admitting: Physician Assistant

## 2022-01-27 ENCOUNTER — Other Ambulatory Visit: Payer: Self-pay

## 2022-01-27 ENCOUNTER — Ambulatory Visit (HOSPITAL_COMMUNITY)
Admission: RE | Admit: 2022-01-27 | Discharge: 2022-01-27 | Disposition: A | Discharge status: Home / Self Care | Payer: Medicaid Other | Source: Ambulatory Visit | Attending: Physician Assistant | Admitting: Physician Assistant

## 2022-01-27 ENCOUNTER — Encounter (HOSPITAL_COMMUNITY): Payer: Self-pay

## 2022-01-27 ENCOUNTER — Inpatient Hospital Stay: Payer: Medicaid Other | Attending: Medical

## 2022-01-27 DIAGNOSIS — C8111 Nodular sclerosis classical Hodgkin lymphoma, lymph nodes of head, face, and neck: Secondary | ICD-10-CM | POA: Diagnosis present

## 2022-01-27 DIAGNOSIS — Z79899 Other long term (current) drug therapy: Secondary | ICD-10-CM | POA: Insufficient documentation

## 2022-01-27 DIAGNOSIS — G629 Polyneuropathy, unspecified: Secondary | ICD-10-CM | POA: Insufficient documentation

## 2022-01-27 DIAGNOSIS — N400 Enlarged prostate without lower urinary tract symptoms: Secondary | ICD-10-CM | POA: Insufficient documentation

## 2022-01-27 DIAGNOSIS — J432 Centrilobular emphysema: Secondary | ICD-10-CM | POA: Insufficient documentation

## 2022-01-27 DIAGNOSIS — I700 Atherosclerosis of aorta: Secondary | ICD-10-CM | POA: Insufficient documentation

## 2022-01-27 DIAGNOSIS — K769 Liver disease, unspecified: Secondary | ICD-10-CM | POA: Insufficient documentation

## 2022-01-27 DIAGNOSIS — R634 Abnormal weight loss: Secondary | ICD-10-CM | POA: Insufficient documentation

## 2022-01-27 DIAGNOSIS — M47812 Spondylosis without myelopathy or radiculopathy, cervical region: Secondary | ICD-10-CM | POA: Insufficient documentation

## 2022-01-27 DIAGNOSIS — R590 Localized enlarged lymph nodes: Secondary | ICD-10-CM | POA: Insufficient documentation

## 2022-01-27 DIAGNOSIS — Z9221 Personal history of antineoplastic chemotherapy: Secondary | ICD-10-CM | POA: Insufficient documentation

## 2022-01-27 LAB — CMP (CANCER CENTER ONLY)
ALT: 20 U/L (ref 0–44)
AST: 21 U/L (ref 15–41)
Albumin: 4.1 g/dL (ref 3.5–5.0)
Alkaline Phosphatase: 97 U/L (ref 38–126)
Anion gap: 6 (ref 5–15)
BUN: 14 mg/dL (ref 6–20)
CO2: 28 mmol/L (ref 22–32)
Calcium: 9.3 mg/dL (ref 8.9–10.3)
Chloride: 105 mmol/L (ref 98–111)
Creatinine: 0.79 mg/dL (ref 0.61–1.24)
GFR, Estimated: >60 mL/min (ref >60)
Glucose, Bld: 105 mg/dL — ABNORMAL HIGH (ref 70–99)
Potassium: 4 mmol/L (ref 3.5–5.1)
Sodium: 139 mmol/L (ref 135–145)
Total Bilirubin: 0.4 mg/dL (ref 0.3–1.2)
Total Protein: 7.6 g/dL (ref 6.5–8.1)

## 2022-01-27 LAB — CBC WITH DIFFERENTIAL (CANCER CENTER ONLY)
Abs Immature Granulocytes: 0.01 10*3/uL (ref 0.00–0.07)
Basophils Absolute: 0 10*3/uL (ref 0.0–0.1)
Basophils Relative: 1 %
Eosinophils Absolute: 0 10*3/uL (ref 0.0–0.5)
Eosinophils Relative: 0 %
HCT: 41.1 % (ref 39.0–52.0)
Hemoglobin: 14.1 g/dL (ref 13.0–17.0)
Immature Granulocytes: 0 %
Lymphocytes Relative: 22 %
Lymphs Abs: 1.4 10*3/uL (ref 0.7–4.0)
MCH: 31 pg (ref 26.0–34.0)
MCHC: 34.3 g/dL (ref 30.0–36.0)
MCV: 90.3 fL (ref 80.0–100.0)
Monocytes Absolute: 0.8 10*3/uL (ref 0.1–1.0)
Monocytes Relative: 13 %
Neutro Abs: 3.9 10*3/uL (ref 1.7–7.7)
Neutrophils Relative %: 64 %
Platelet Count: 243 10*3/uL (ref 150–400)
RBC: 4.55 MIL/uL (ref 4.22–5.81)
RDW: 12.5 % (ref 11.5–15.5)
WBC Count: 6.1 10*3/uL (ref 4.0–10.5)
nRBC: 0 % (ref 0.0–0.2)

## 2022-01-27 LAB — LACTATE DEHYDROGENASE: LDH: 146 U/L (ref 98–192)

## 2022-01-27 IMAGING — CT CT ABD-PELV W/ CM
2 of 5 series · 13 of 36 positions shown, 16 images · IV contrast (OMNIPAQUE)
Comparison: Prior CTs [DATE] and [DATE]. PET-CT [DATE].

CLINICAL DATA: Restaging metastatic disease of unknown primary.
History of lymphoma (nodular sclerosing Hodgkin's lymphoma) in [K5].
* Tracking Code: BO *

EXAM:
CT CHEST, ABDOMEN, AND PELVIS WITH CONTRAST
TECHNIQUE: Multidetector CT imaging of the chest, abdomen and pelvis was
performed following the standard protocol during bolus
administration of intravenous contrast.

[Series 3: cap with · axial · 0.67mm/px · z∈[-468,+32]mm · 10 of 124 slices shown, 13 images]
[im 12/124  mediastinal]
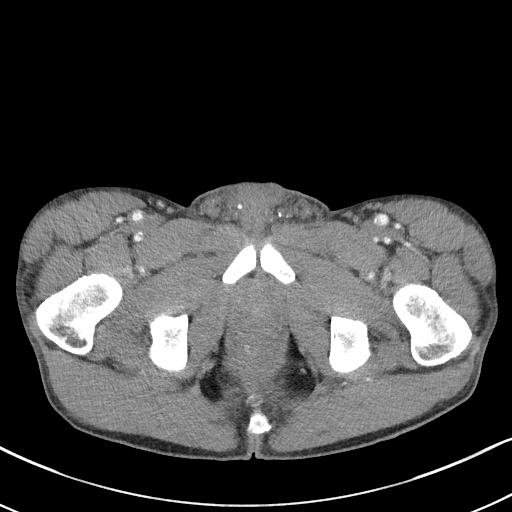
[im 12/124  lung]
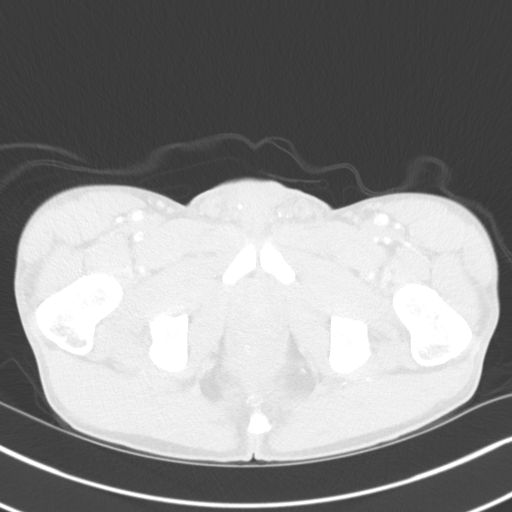
[im 23/124  lung]
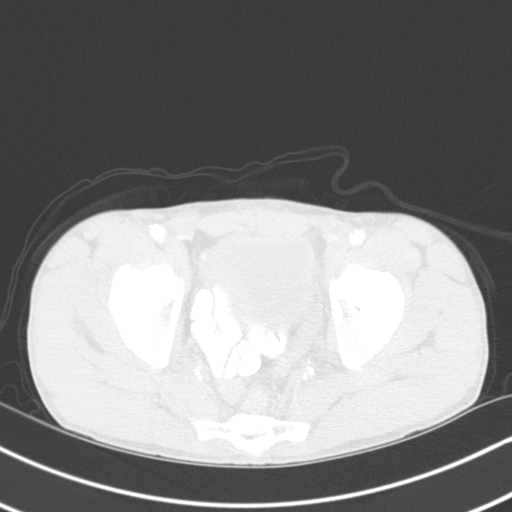
[im 34/124  lung]
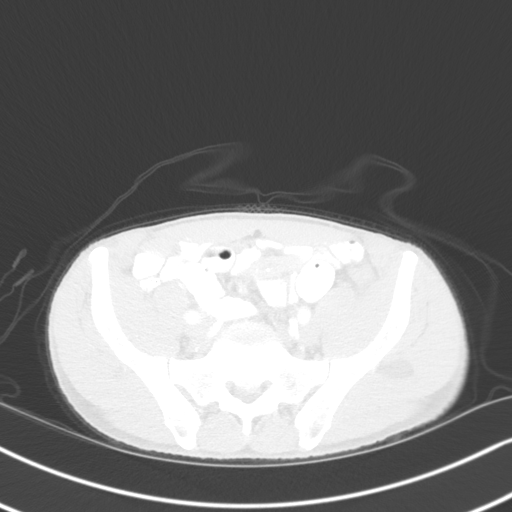
[im 45/124  lung]
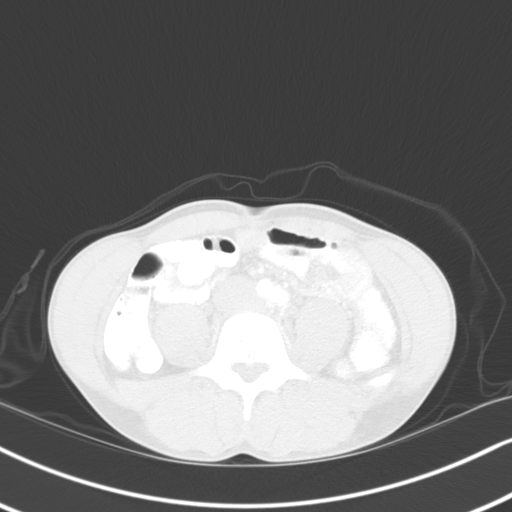
[im 56/124  mediastinal]
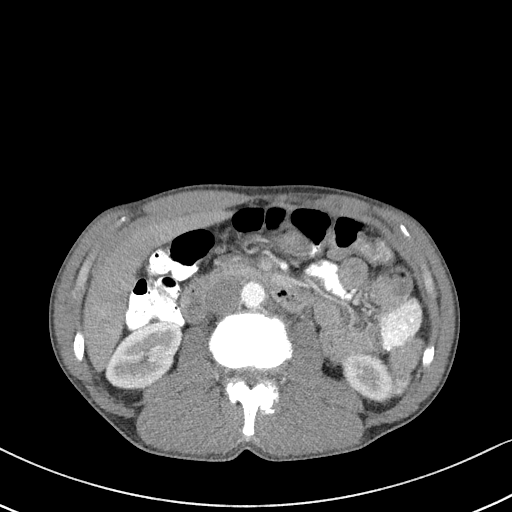
[im 56/124  lung]
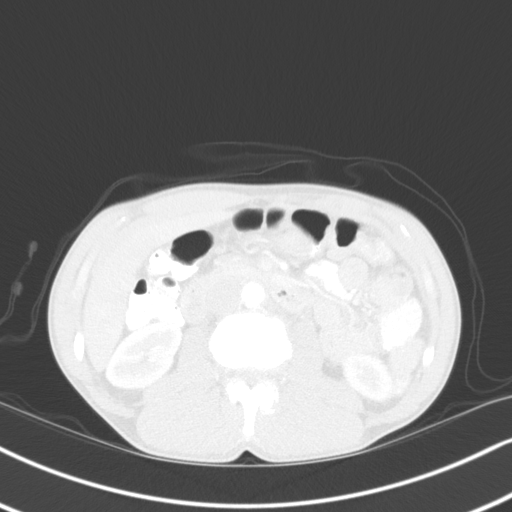
[im 68/124  lung]
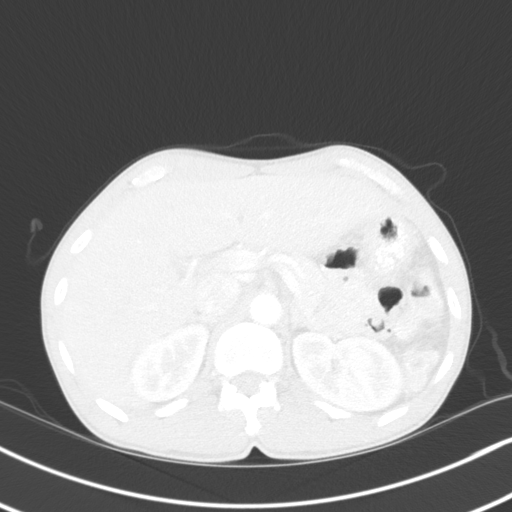
[im 79/124  lung]
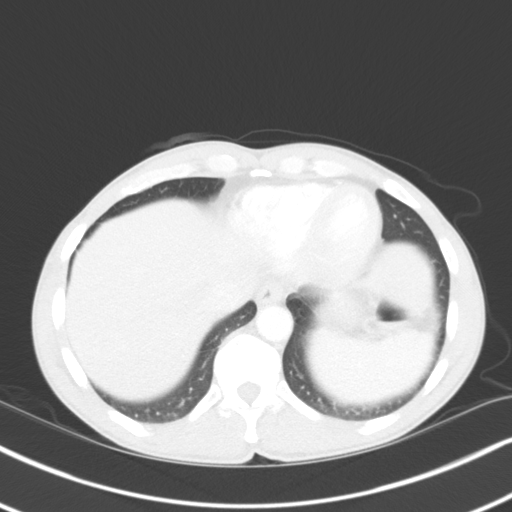
[im 90/124  lung]
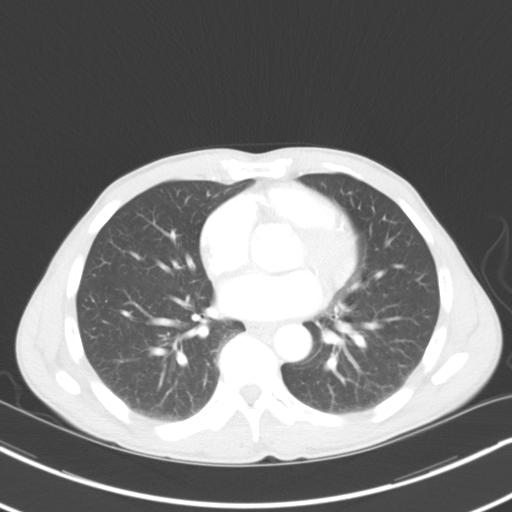
[im 101/124  mediastinal]
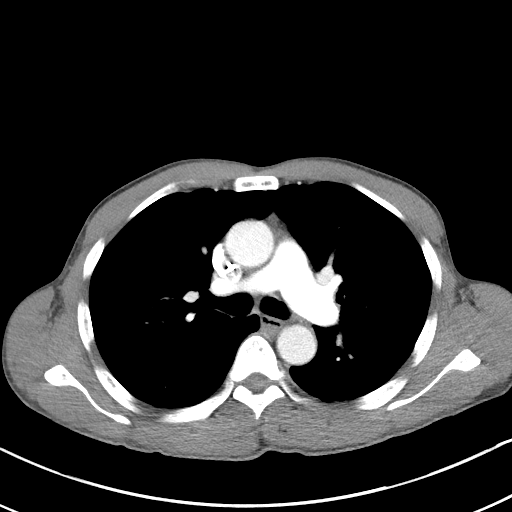
[im 101/124  lung]
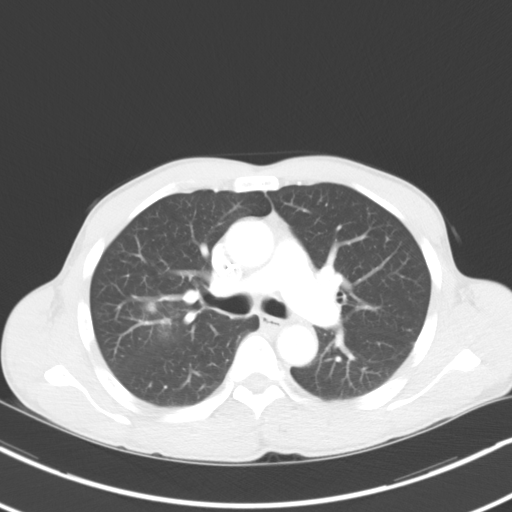
[im 112/124  lung]
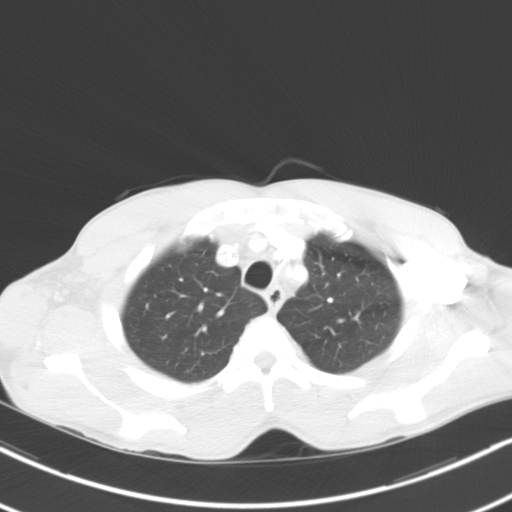

[Series 4: coronals · coronal · 0.96mm/px · 3 of 104 slices shown]
[im 21/104  lung]
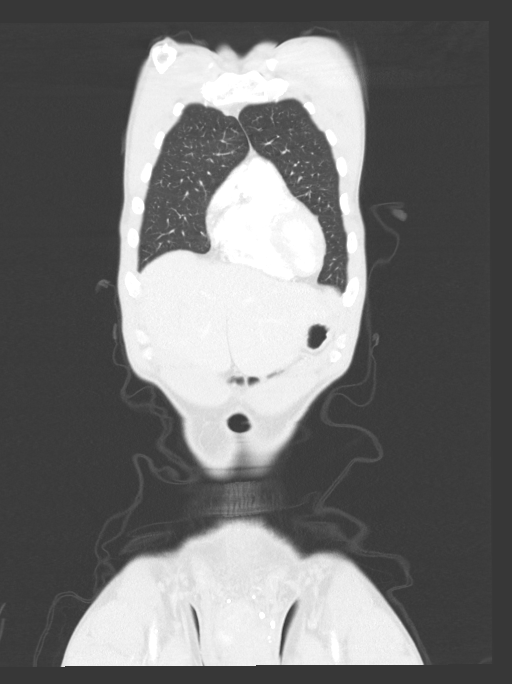
[im 42/104  lung]
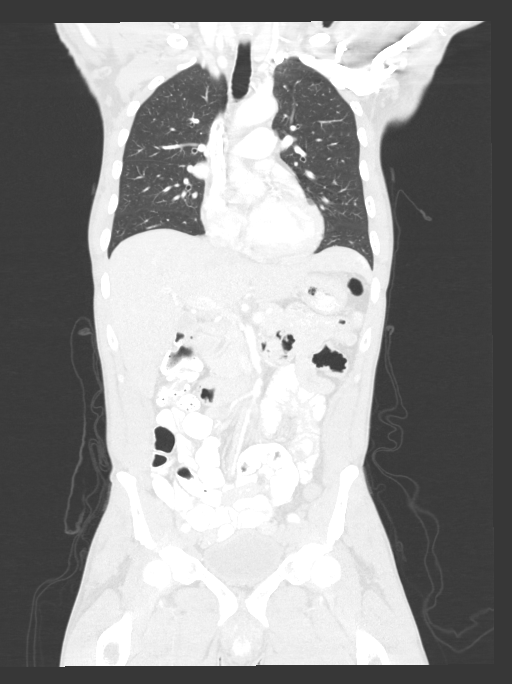
[im 62/104  lung]
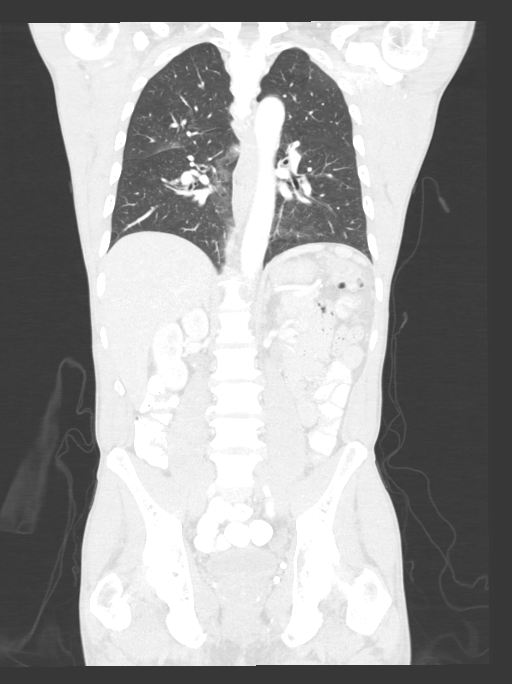

[13 of 36 positions shown; findings below may reference images not displayed]

RADIATION DOSE REDUCTION: This exam was performed according to the
departmental dose-optimization program which includes automated
exposure control, adjustment of the mA and/or kV according to
patient size and/or use of iterative reconstruction technique.

CONTRAST:  100mL OMNIPAQUE IOHEXOL 300 MG/ML  SOLN
FINDINGS: CT CHEST FINDINGS

Cardiovascular: No acute vascular findings. Right IJ Port-A-Cath
extends to the superior cavoatrial junction. There is
atherosclerosis of the aorta, great vessels and coronary arteries.
The heart size is normal. There is no pericardial effusion.

Mediastinum/Nodes: There are no enlarged mediastinal, hilar or
axillary lymph nodes. Small axillary lymph nodes are unchanged. The
thyroid gland, trachea and esophagus demonstrate no significant
findings.

Lungs/Pleura: No pleural effusion or pneumothorax. Stable mild
centrilobular emphysema and central airway thickening with new
patchy ground-glass opacity inferiorly in the right upper lobe
(image 53/1), likely inflammatory/infectious. No suspicious
pulmonary nodules.

Musculoskeletal/Chest wall: No chest wall mass or suspicious osseous
findings.

CT ABDOMEN AND PELVIS FINDINGS

Hepatobiliary: The liver appears stable without suspicious lesion.
There is a stable dominant 3.9 cm hemangioma in segment 7 (image
53/3), as well as additional flash filling hemangiomas and small
cysts. No evidence of gallstones, gallbladder wall thickening or
biliary dilatation.

Pancreas: Unremarkable. No pancreatic ductal dilatation or
surrounding inflammatory changes.

Spleen: Normal in size without focal abnormality.

Adrenals/Urinary Tract: Both adrenal glands appear normal. The
kidneys appear normal without evidence of urinary tract calculus,
suspicious lesion or hydronephrosis. The bladder appears normal for
its degree of distention.

Stomach/Bowel: Enteric contrast was administered and has passed into
the mid colon. The stomach appears unremarkable for its degree of
distension. No evidence of bowel wall thickening, distention or
surrounding inflammatory change.

Vascular/Lymphatic: Retroperitoneal assessment limited by the
paucity of retroperitoneal fat. No enlarged abdominopelvic lymph
nodes are identified. There is aortic and branch vessel
atherosclerosis without evidence of aneurysm or large vessel
occlusion.

Reproductive: Stable enlargement of the prostate gland.

Other: No evidence of abdominal wall mass or hernia. No ascites.

Musculoskeletal: No acute or significant osseous findings.
IMPRESSION: 1. New patchy ground-glass pulmonary opacities inferiorly in the
right upper lobe, likely infectious inflammatory.
2. Otherwise stable examinations without adenopathy or other
findings suspicious for malignancy in the chest, abdomen or pelvis.
3. Stable incidental benign hepatic lesions.
4. Coronary and aortic atherosclerosis ([K5]-[K5]). Emphysema
([K5]-[K5]).

## 2022-01-27 IMAGING — CT CT NECK W/ CM
4 of 5 series · 13 of 35 positions shown, 15 images · IV contrast (agent unspecified)
Comparison: [DATE]

CLINICAL DATA: History of lymphoma.  Follow-up.

EXAM:
CT NECK WITH CONTRAST
TECHNIQUE: Multidetector CT imaging of the neck was performed using the
standard protocol following the bolus administration of intravenous
contrast.

[Series 6: axial bone · axial · 0.43mm/px · z∈[+56,+170]mm · 3 of 115 slices shown, 4 images]
[im 29/115  soft-tissue]
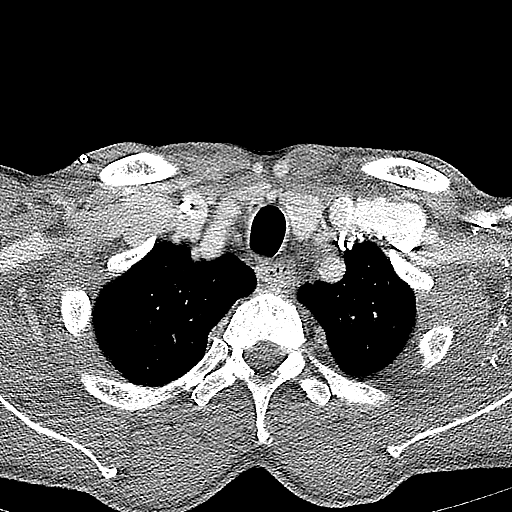
[im 29/115  bone]
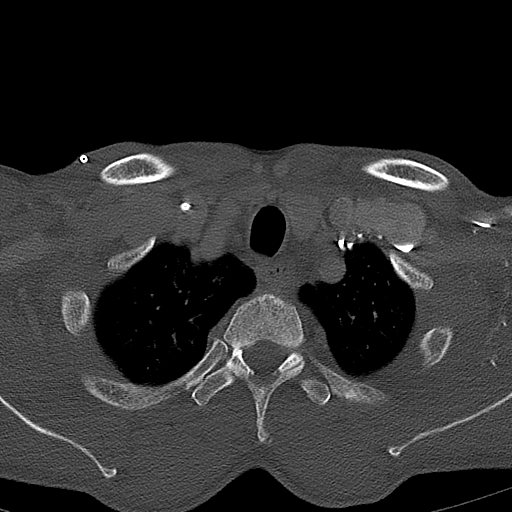
[im 58/115  bone]
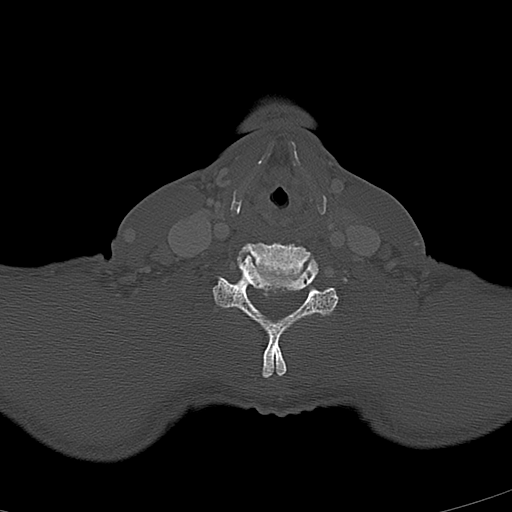
[im 86/115  bone]
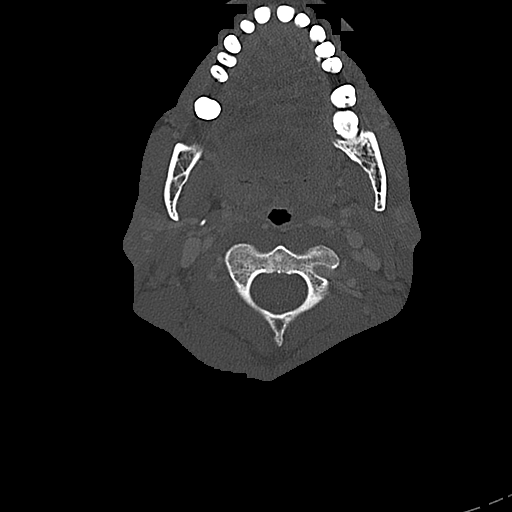

[Series 7: orthogonal (person_name) · axial · 0.39mm/px · z∈[+46,+106]mm · 2 of 121 slices shown]
[im 31/121  bone]
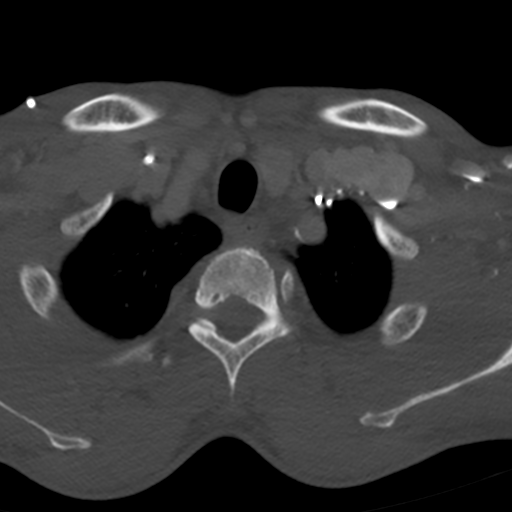
[im 61/121  bone]
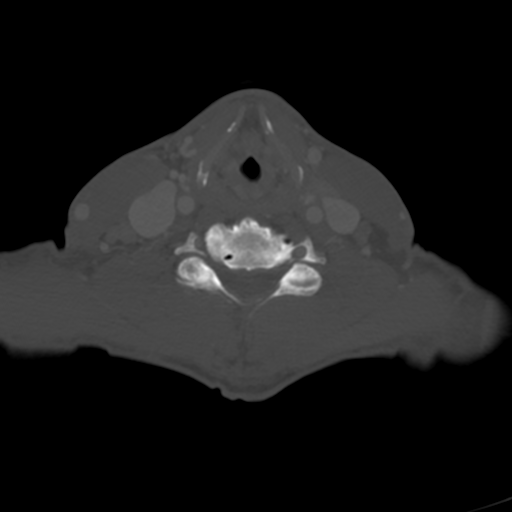

[Series 8: cor neck · coronal · 0.46mm/px · 3 of 115 slices shown]
[im 23/115  bone]
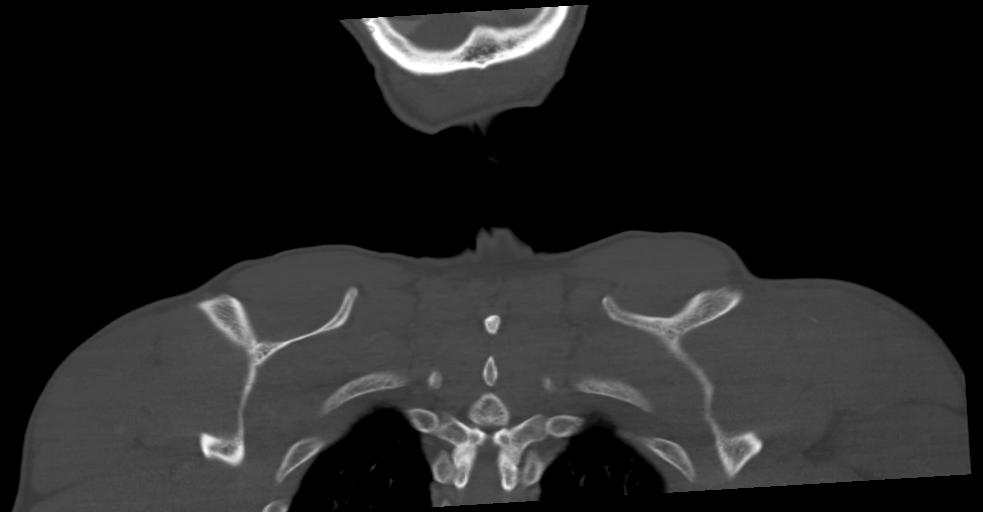
[im 46/115  bone]
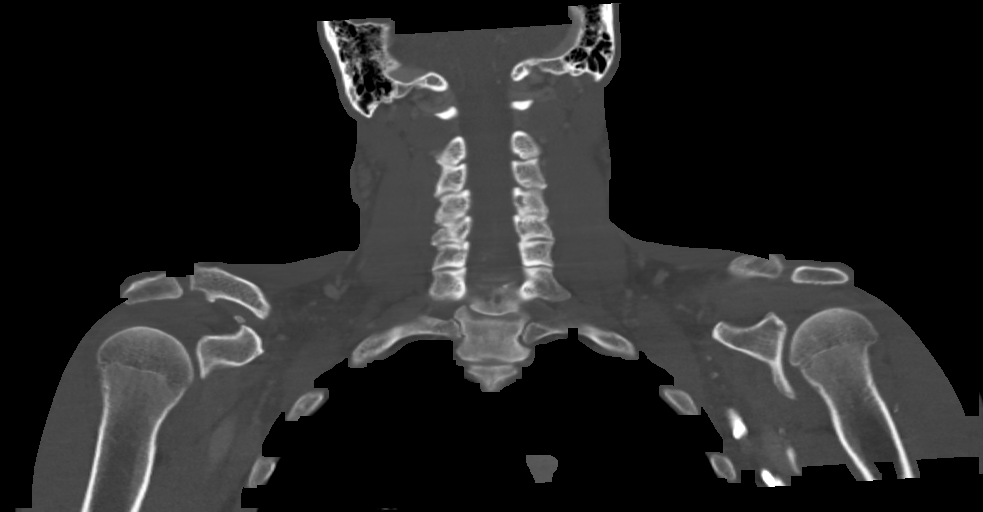
[im 69/115  bone]
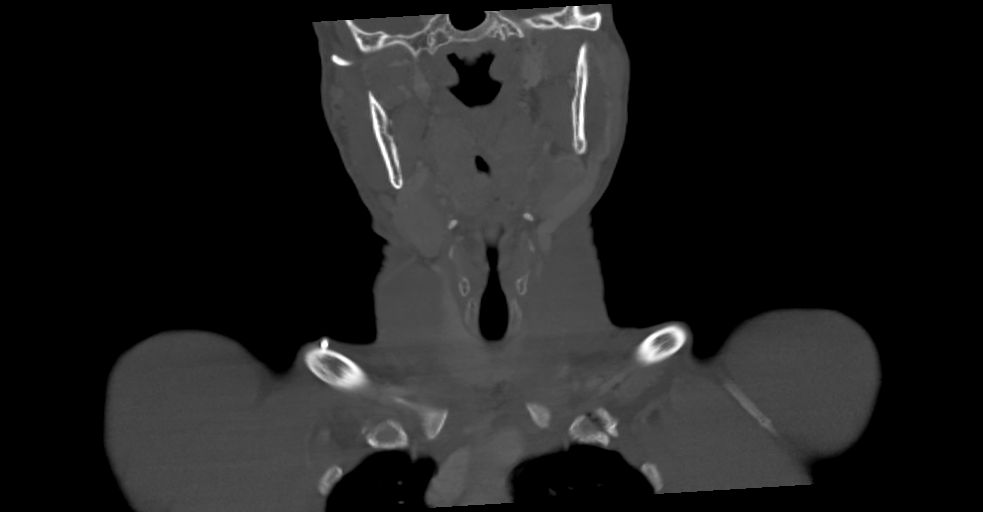

[Series 9: sag neck · sagittal · 0.49mm/px · 5 of 92 slices shown, 6 images]
[im 31/92  bone]
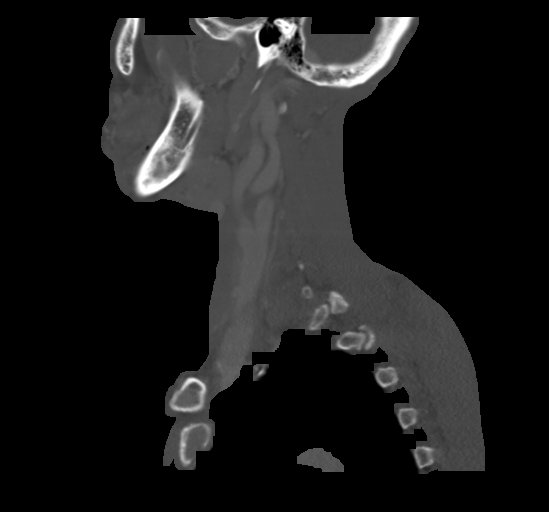
[im 38/92  bone]
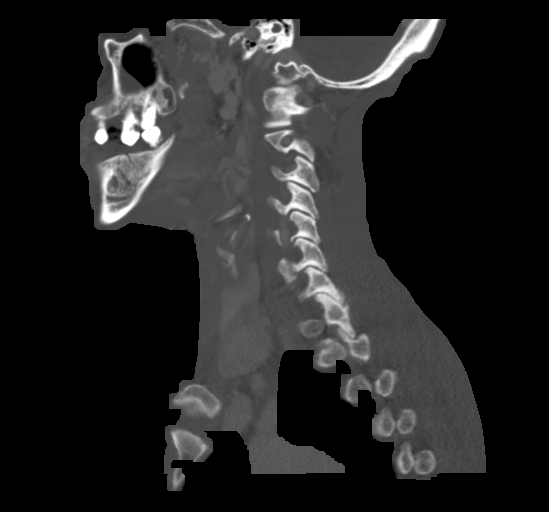
[im 46/92  soft-tissue]
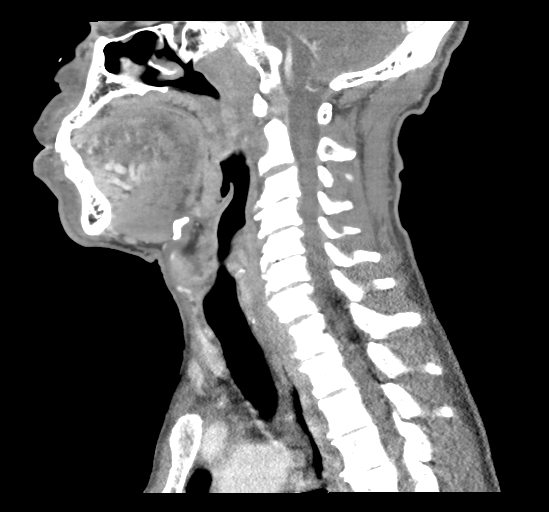
[im 46/92  bone]
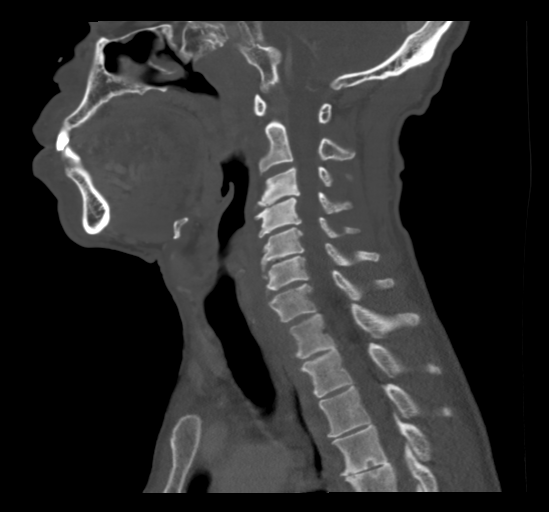
[im 54/92  bone]
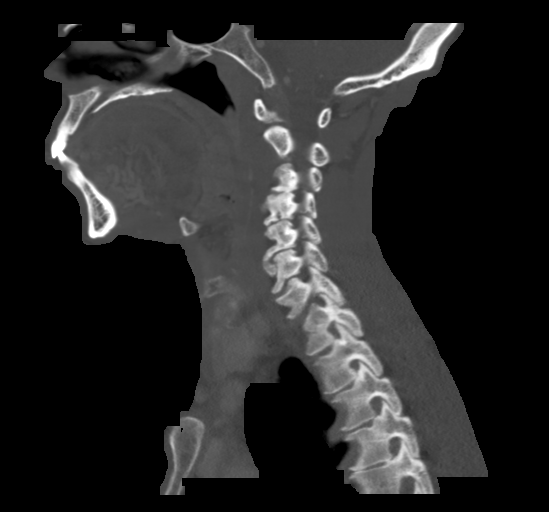
[im 61/92  bone]
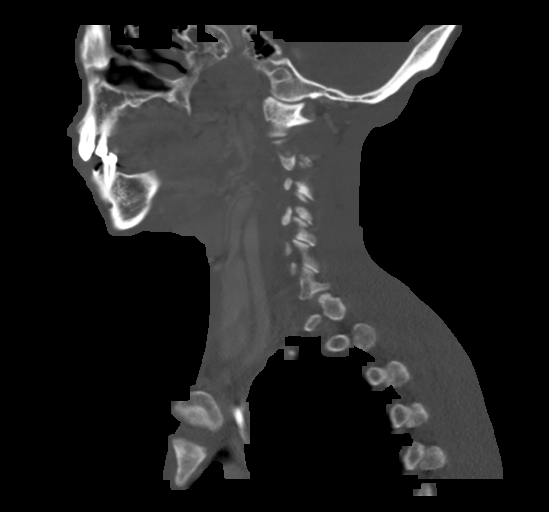

[13 of 35 positions shown; findings below may reference images not displayed]

RADIATION DOSE REDUCTION: This exam was performed according to the
departmental dose-optimization program which includes automated
exposure control, adjustment of the mA and/or kV according to
patient size and/or use of iterative reconstruction technique.

CONTRAST:  100mL OMNIPAQUE IOHEXOL 300 MG/ML  SOLN
FINDINGS: Pharynx and larynx: No evidence of mucosal or submucosal lesion.

Salivary glands: Parotid and submandibular glands are normal today.
Previously seen cystic abnormality at or adjacent to the right
parotid tail is no longer present. Submandibular glands are normal.

Thyroid: Normal

Lymph nodes: On the right, there is enlargement of a lymph node at
the level 2 station just anterior to the jugular vein measuring
cm in length with a transverse diameter of 14 x 15 mm. Additional
right posterior triangle level 5 nodes have gotten larger and show
enhancement. Short axis dimension of those nodes are 7 mm or less,
but there are suspicious based on their interval enlargement and
enhancing nature.

On the left, there is a level 2 lymph node which has enlarged since
the prior study, short axis dimension 13 x 17 mm with length of 2
cm, suspicious for involvement. Previously treated nodes in the
posterior chain on the left remain visible, showing low-density
without enhancement, therefore not suspicious for active
involvement.

Vascular: Arterial and venous structures are patent and normal.

Limited intracranial: Normal

Visualized orbits: Normal

Mastoids and visualized paranasal sinuses: Clear

Skeleton: Ordinary cervical spondylosis.

Upper chest: Normal

Other: None
IMPRESSION: Recurrent lymphadenopathy in the right neck. Largest single node is
a right level 2 node measuring 25 x 14 x 15 mm. Enlargement also of
posterior triangle level 5 nodes on the right suspicious for
involvement.

Enlargement of a level 2 node on the left, short axis dimension 13 x
17 mm with length of 2 cm, worrisome for recurrent disease. Other
previously treated posterior triangle nodes on the left do not show
interval enlargement or enhancement when compared to the prior exam.

## 2022-01-27 MED ORDER — SODIUM CHLORIDE (PF) 0.9 % IJ SOLN
INTRAMUSCULAR | Status: AC
  Filled 2022-01-27: qty 50

## 2022-01-27 MED ORDER — IOHEXOL 300 MG/ML  SOLN
100.0000 mL | Freq: Once | INTRAMUSCULAR | Status: AC | PRN
  Administered 2022-01-27: 100 mL via INTRAVENOUS

## 2022-01-27 NOTE — Progress Notes (Signed)
[facility] OFFICE PROGRESS NOTE  Vonna Drafts, FNP [address] [place] [address]  DIAGNOSIS: Stage IVB Hodgkin's Lymphoma, nodular sclerosis subtype. He presented with a bulky left cervical and supraclavicular lymphadenopathy as well as involvement of the spleen, nodal disease in the porta hepatis, and extensive disease involving the marrow of the pelvis, spine, and ribs. He was diagnosed in September 2020.    IPS score: 4   PRIOR THERAPY: Systemic chemotherapy with brentuximab vedotin 1.2 mg/kg, doxorubicin 25 mg/m2, vinblastine 6 mg/m2, dacarbazine 375 mg/m2 on days 1 and 15 every 28 days. First dose expected on 05/30/2019.   Status post 6 cycles.   CURRENT THERAPY: Observation  INTERVAL HISTORY: Jesse [name] 56 y.o. male returns to the clinic today for a 65-monthfollow-up visit.  The patient was last seen in the clinic on 07/01/2021.  The patient has been on observation since completing treatment with chemotherapy in March 2021.  In May 2022 the patient did have an enlarging right cervical lymph node which was concerning for disease recurrence but he had a PET scan performed which did not show any significant activity or disease recurrence.  This area was evaluated by ENT, Dr. BRedmond Basemanin October 2022 which showed a mildly large but more simple cystic mass in the right parotid tail likely posttreatment cavitation of a lymphomatous node and this area was excised on 06/01/2021 which showed lymphoepithelial cyst involving the salivary gland.   Otherwise the patient has been feeling well since then without any new concerning adenopathy except a few days ago he woke up and his left neck was swollen and sore. He notes this has improved. He also has peripheral neuropathy. He stopped taking his gabapentin. Of note, the patient has heavy alcohol use. He drinks about 12 small cans of beer daily. He denies any fever, chills, or night sweats.  He lost 4 lbs  since last being seen as he does not eat a lot of foods with good nutritional content with his alcohol consumption. The patient  He denies any recent or recurrent signs of infection including nasal congestion, cough, shortness of breath, skin infections, or dysuria.  Denies any early satiety or abdominal fullness.  Denies any abnormal bleeding or bruising.  He misses his Port-A-Cath flush appointments periodically. His last port flush was on 11/30/21. The patient recently had a restaging CT scan performed.  He is here today for evaluation and to review his scan results.    MEDICAL HISTORY: Past Medical History:  Diagnosis Date   ETOH abuse    hodgkins lymphoma 2020   Lymphadenopathy    left   Marijuana abuse     ALLERGIES:  has No Known Allergies.  MEDICATIONS:  Current Outpatient Medications  Medication Sig Dispense Refill   gabapentin (NEURONTIN) 100 MG capsule Take 1 capsule (100 mg total) by mouth 3 (three) times daily. 90 capsule 2   lidocaine (LIDODERM) 5 % Place 1 patch onto the skin daily. Remove & Discard patch within 12 hours or as directed by MD (Patient not taking: Reported on 02/01/2022) 30 patch 0   lidocaine-prilocaine (EMLA) cream Apply 1 application topically as needed. Apply to port site  45 minutes prior to port being accessed (Patient not taking: Reported on 02/01/2022) 30 g 1   methylPREDNISolone (MEDROL DOSEPAK) 4 MG TBPK tablet Per packet instructions (Patient not taking: Reported on 02/01/2022) 21 each 0   naproxen sodium (ALEVE) 220 MG tablet Take 660 mg by  mouth daily as needed (pain). (Patient not taking: Reported on 02/01/2022)     oxyCODONE-acetaminophen (PERCOCET) 7.5-325 MG tablet Take 1 tablet by mouth every 6 (six) hours. (Patient not taking: Reported on 02/01/2022)     prochlorperazine (COMPAZINE) 10 MG tablet Take 1 tablet (10 mg total) by mouth every 8 (eight) hours as needed for nausea or vomiting. (Patient not taking: Reported on 02/01/2022) 30 tablet 2   No  current facility-administered medications for this visit.   Facility-Administered Medications Ordered in Other Visits  Medication Dose Route Frequency Provider Last Rate Last Admin   sodium chloride flush (NS) 0.9 % injection 10 mL  10 mL Intracatheter PRN Curt Bears, MD   10 mL at 07/01/21 1551   sodium chloride flush (NS) 0.9 % injection 10 mL  10 mL Intracatheter PRN Curt Bears, MD   10 mL at 02/01/22 1049    SURGICAL HISTORY:  Past Surgical History:  Procedure Laterality Date   HERNIA REPAIR     as child   IR IMAGING GUIDED PORT INSERTION  05/29/2019   LYMPH NODE BIOPSY Left 05/07/2019   Procedure: LYMPH NODE BIOPSY;  Surgeon: Melida Quitter, MD;  Location: [facility];  Service: ENT;  Laterality: Left;    REVIEW OF SYSTEMS:   Review of Systems  Constitutional: Positive for decreased appetite and weight loss. Negative for appetite change, chills and fever.   HENT: Negative for mouth sores, nosebleeds, sore throat and trouble swallowing.   Eyes: Negative for eye problems and icterus.  Respiratory: Negative for cough, hemoptysis, shortness of breath and wheezing.   Cardiovascular: Negative for chest pain and leg swelling.  Gastrointestinal: Negative for abdominal pain, constipation, diarrhea, nausea and vomiting.  Genitourinary: Negative for bladder incontinence, difficulty urinating, dysuria, frequency and hematuria.   Musculoskeletal: Negative for back pain, gait problem, neck pain and neck stiffness.  Skin: Negative for itching and rash.  Neurological: Positive for peripheral neuropathy. Negative for dizziness, extremity weakness, gait problem, headaches, light-headedness and seizures.  Hematological: Positive for enlarged left cervical lymph node. Does not bruise/bleed easily.  Psychiatric/Behavioral: Negative for confusion, depression and sleep disturbance. The patient is not nervous/anxious.     PHYSICAL EXAMINATION:  Blood pressure 118/72, pulse 61,  temperature 98.2 F (36.8 C), resp. rate 16, height '5\' 6"'$  (1.676 m), weight 131 lb 11.2 oz (59.7 kg), SpO2 99 %.  ECOG PERFORMANCE STATUS: 1 Physical Exam  Constitutional: Oriented to person, place, and time and well-developed, well-nourished, and in no distress.  HENT:  Head: Normocephalic and atraumatic.  Mouth/Throat: Oropharynx is clear and moist. No oropharyngeal exudate.  Eyes: Conjunctivae are normal. Right eye exhibits no discharge. Left eye exhibits no discharge. No scleral icterus.  Neck: Normal range of motion. Neck supple.  Cardiovascular: Normal rate, regular rhythm, normal heart sounds and intact distal pulses.   Pulmonary/Chest: Effort normal and breath sounds normal. No respiratory distress. No wheezes. No rales.  Abdominal: Soft. Bowel sounds are normal. Exhibits no distension and no mass. There is no tenderness.  Musculoskeletal: Normal range of motion. Exhibits no edema.  Lymphadenopathy:    Positive for cervical adenopathy. Neurological: Alert and oriented to person, place, and time. Exhibits normal muscle tone. Gait normal. Coordination normal.  Skin: Skin is warm and dry. No rash noted. Not diaphoretic. No erythema. No pallor.  Psychiatric: Mood, memory and judgment normal.  Vitals reviewed.  LABORATORY DATA: Lab Results  Component Value Date   WBC 6.1 01/27/2022   HGB 14.1 01/27/2022  HCT 41.1 01/27/2022   MCV 90.3 01/27/2022   PLT 243 01/27/2022      Chemistry      Component Value Date/Time   NA 139 01/27/2022 1011   K 4.0 01/27/2022 1011   CL 105 01/27/2022 1011   CO2 28 01/27/2022 1011   BUN 14 01/27/2022 1011   CREATININE 0.79 01/27/2022 1011      Component Value Date/Time   CALCIUM 9.3 01/27/2022 1011   ALKPHOS 97 01/27/2022 1011   AST 21 01/27/2022 1011   ALT 20 01/27/2022 1011   BILITOT 0.4 01/27/2022 1011       RADIOGRAPHIC STUDIES:  CT Abdomen Pelvis W Contrast  Result Date: 01/28/2022 CLINICAL DATA:  Restaging metastatic  disease of unknown primary. History of lymphoma (nodular sclerosing Hodgkin's lymphoma) in 2020. * Tracking Code: BO * EXAM: CT CHEST, ABDOMEN, AND PELVIS WITH CONTRAST TECHNIQUE: Multidetector CT imaging of the chest, abdomen and pelvis was performed following the standard protocol during bolus administration of intravenous contrast. RADIATION DOSE REDUCTION: This exam was performed according to the departmental dose-optimization program which includes automated exposure control, adjustment of the mA and/or kV according to patient size and/or use of iterative reconstruction technique. CONTRAST:  152m OMNIPAQUE IOHEXOL 300 MG/ML  SOLN COMPARISON:  Prior CTs 06/25/2021 and 06/03/2020. PET-CT 12/10/2020. FINDINGS: CT CHEST FINDINGS Cardiovascular: No acute vascular findings. Right IJ Port-A-Cath extends to the superior cavoatrial junction. There is atherosclerosis of the aorta, great vessels and coronary arteries. The heart size is normal. There is no pericardial effusion. Mediastinum/Nodes: There are no enlarged mediastinal, hilar or axillary lymph nodes. Small axillary lymph nodes are unchanged. The thyroid gland, trachea and esophagus demonstrate no significant findings. Lungs/Pleura: No pleural effusion or pneumothorax. Stable mild centrilobular emphysema and central airway thickening with new patchy ground-glass opacity inferiorly in the right upper lobe (image 53/1), likely inflammatory/infectious. No suspicious pulmonary nodules. Musculoskeletal/Chest wall: No chest wall mass or suspicious osseous findings. CT ABDOMEN AND PELVIS FINDINGS Hepatobiliary: The liver appears stable without suspicious lesion. There is a stable dominant 3.9 cm hemangioma in segment 7 (image 53/3), as well as additional flash filling hemangiomas and small cysts. No evidence of gallstones, gallbladder wall thickening or biliary dilatation. Pancreas: Unremarkable. No pancreatic ductal dilatation or surrounding inflammatory changes.  Spleen: Normal in size without focal abnormality. Adrenals/Urinary Tract: Both adrenal glands appear normal. The kidneys appear normal without evidence of urinary tract calculus, suspicious lesion or hydronephrosis. The bladder appears normal for its degree of distention. Stomach/Bowel: Enteric contrast was administered and has passed into the mid colon. The stomach appears unremarkable for its degree of distension. No evidence of bowel wall thickening, distention or surrounding inflammatory change. Vascular/Lymphatic: Retroperitoneal assessment limited by the paucity of retroperitoneal fat. No enlarged abdominopelvic lymph nodes are identified. There is aortic and branch vessel atherosclerosis without evidence of aneurysm or large vessel occlusion. Reproductive: Stable enlargement of the prostate gland. Other: No evidence of abdominal wall mass or hernia. No ascites. Musculoskeletal: No acute or significant osseous findings. IMPRESSION: 1. New patchy ground-glass pulmonary opacities inferiorly in the right upper lobe, likely infectious inflammatory. 2. Otherwise stable examinations without adenopathy or other findings suspicious for malignancy in the chest, abdomen or pelvis. 3. Stable incidental benign hepatic lesions. 4. Coronary and aortic atherosclerosis (ICD10-I70.0). Emphysema (ICD10-J43.9). Electronically Signed   By: WRichardean SaleM.D.   On: 01/28/2022 17:17   CT Chest W Contrast  Result Date: 01/28/2022 CLINICAL DATA:  Restaging metastatic disease of unknown primary. History of  lymphoma (nodular sclerosing Hodgkin's lymphoma) in 2020. * Tracking Code: BO * EXAM: CT CHEST, ABDOMEN, AND PELVIS WITH CONTRAST TECHNIQUE: Multidetector CT imaging of the chest, abdomen and pelvis was performed following the standard protocol during bolus administration of intravenous contrast. RADIATION DOSE REDUCTION: This exam was performed according to the departmental dose-optimization program which includes automated  exposure control, adjustment of the mA and/or kV according to patient size and/or use of iterative reconstruction technique. CONTRAST:  121m OMNIPAQUE IOHEXOL 300 MG/ML  SOLN COMPARISON:  Prior CTs 06/25/2021 and 06/03/2020. PET-CT 12/10/2020. FINDINGS: CT CHEST FINDINGS Cardiovascular: No acute vascular findings. Right IJ Port-A-Cath extends to the superior cavoatrial junction. There is atherosclerosis of the aorta, great vessels and coronary arteries. The heart size is normal. There is no pericardial effusion. Mediastinum/Nodes: There are no enlarged mediastinal, hilar or axillary lymph nodes. Small axillary lymph nodes are unchanged. The thyroid gland, trachea and esophagus demonstrate no significant findings. Lungs/Pleura: No pleural effusion or pneumothorax. Stable mild centrilobular emphysema and central airway thickening with new patchy ground-glass opacity inferiorly in the right upper lobe (image 53/1), likely inflammatory/infectious. No suspicious pulmonary nodules. Musculoskeletal/Chest wall: No chest wall mass or suspicious osseous findings. CT ABDOMEN AND PELVIS FINDINGS Hepatobiliary: The liver appears stable without suspicious lesion. There is a stable dominant 3.9 cm hemangioma in segment 7 (image 53/3), as well as additional flash filling hemangiomas and small cysts. No evidence of gallstones, gallbladder wall thickening or biliary dilatation. Pancreas: Unremarkable. No pancreatic ductal dilatation or surrounding inflammatory changes. Spleen: Normal in size without focal abnormality. Adrenals/Urinary Tract: Both adrenal glands appear normal. The kidneys appear normal without evidence of urinary tract calculus, suspicious lesion or hydronephrosis. The bladder appears normal for its degree of distention. Stomach/Bowel: Enteric contrast was administered and has passed into the mid colon. The stomach appears unremarkable for its degree of distension. No evidence of bowel wall thickening, distention or  surrounding inflammatory change. Vascular/Lymphatic: Retroperitoneal assessment limited by the paucity of retroperitoneal fat. No enlarged abdominopelvic lymph nodes are identified. There is aortic and branch vessel atherosclerosis without evidence of aneurysm or large vessel occlusion. Reproductive: Stable enlargement of the prostate gland. Other: No evidence of abdominal wall mass or hernia. No ascites. Musculoskeletal: No acute or significant osseous findings. IMPRESSION: 1. New patchy ground-glass pulmonary opacities inferiorly in the right upper lobe, likely infectious inflammatory. 2. Otherwise stable examinations without adenopathy or other findings suspicious for malignancy in the chest, abdomen or pelvis. 3. Stable incidental benign hepatic lesions. 4. Coronary and aortic atherosclerosis (ICD10-I70.0). Emphysema (ICD10-J43.9). Electronically Signed   By: WRichardean SaleM.D.   On: 01/28/2022 17:17   CT Soft Tissue Neck W Contrast  Result Date: 01/28/2022 CLINICAL DATA:  History of lymphoma.  Follow-up. EXAM: CT NECK WITH CONTRAST TECHNIQUE: Multidetector CT imaging of the neck was performed using the standard protocol following the bolus administration of intravenous contrast. RADIATION DOSE REDUCTION: This exam was performed according to the departmental dose-optimization program which includes automated exposure control, adjustment of the mA and/or kV according to patient size and/or use of iterative reconstruction technique. CONTRAST:  1054mOMNIPAQUE IOHEXOL 300 MG/ML  SOLN COMPARISON:  05/21/2021 FINDINGS: Pharynx and larynx: No evidence of mucosal or submucosal lesion. Salivary glands: Parotid and submandibular glands are normal today. Previously seen cystic abnormality at or adjacent to the right parotid tail is no longer present. Submandibular glands are normal. Thyroid: Normal Lymph nodes: On the right, there is enlargement of a lymph node at the level 2 station just  anterior to the jugular  vein measuring 2.5 cm in length with a transverse diameter of 14 x 15 mm. Additional right posterior triangle level 5 nodes have gotten larger and show enhancement. Short axis dimension of those nodes are 7 mm or less, but there are suspicious based on their interval enlargement and enhancing nature. On the left, there is a level 2 lymph node which has enlarged since the prior study, short axis dimension 13 x 17 mm with length of 2 cm, suspicious for involvement. Previously treated nodes in the posterior chain on the left remain visible, showing low-density without enhancement, therefore not suspicious for active involvement. Vascular: Arterial and venous structures are patent and normal. Limited intracranial: Normal Visualized orbits: Normal Mastoids and visualized paranasal sinuses: Clear Skeleton: Ordinary cervical spondylosis. Upper chest: Normal Other: None IMPRESSION: Recurrent lymphadenopathy in the right neck. Largest single node is a right level 2 node measuring 25 x 14 x 15 mm. Enlargement also of posterior triangle level 5 nodes on the right suspicious for involvement. Enlargement of a level 2 node on the left, short axis dimension 13 x 17 mm with length of 2 cm, worrisome for recurrent disease. Other previously treated posterior triangle nodes on the left do not show interval enlargement or enhancement when compared to the prior exam. Electronically Signed   By: Nelson Chimes M.D.   On: 01/28/2022 11:24     ASSESSMENT/PLAN:  This is a very pleasant 56 years old African-American male diagnosed with a stage IV Hodgkin lymphoma, nodular sclerosing subtype. He presented with a bulky left cervical and supraclavicular lymphadenopathy as well as involvement of the spleen, nodal disease in the porta hepatis, and extensive disease involving the marrow of the pelvis, spine, and ribs. He was diagnosed in September 2020.  He underwent systemic chemotherapy with brentuximab Vedotin in addition to doxorubicin,  vinblastine and dacarbazine.  Status post 6 cycles. His last dose was in October 2021.    His last PET scan after the treatment showed almost complete resolution of his disease with no hypermetabolic activity in the neck, chest, abdomen pelvis or the osseous structures.  He has Deauville1.  The patient is currently on observation and he is feeling fine today with no concerning complaints.  The patient recently had a restaging CT scan the neck, chest, abdomen, and pelvis.  The patient was seen with Dr. Julien Nordmann today.  Dr. Julien Nordmann personally and independently reviewed the scan discussed results with patient today.  The scan showed enlarging lymphadenopathy in the right neck and Enlargement of a level 2 node on the left.   Dr. Julien Nordmann recommends arranging for a PET scan for restaging and to assess for sites amenable to biopsy.   We will see the patient back for a follow up visit in 2 weeks for evaluation and to review his scan results.   I have refilled his gabapentin today. Discussed with the patient the neuropathy is likely secondary to alcohol abuse. Encouraged him to cut back/quit.   He is due for a port flush. I will arrange for a flush appointment today.   The patient was advised to call immediately if he has any concerning symptoms in the interval. The patient voices understanding of current disease status and treatment options and is in agreement with the current care plan. All questions were answered. The patient knows to call the clinic with any problems, questions or concerns. We can certainly see the patient much sooner if necessary  Orders Placed This Encounter  Procedures   NM PET Image Restag (PS) Skull Base To Thigh    Standing Status:   Future    Standing Expiration Date:   02/01/2023    Order Specific Question:   If indicated for the ordered procedure, I authorize the administration of a radiopharmaceutical per Radiology protocol    Answer:   Yes    Order Specific  Question:   Preferred imaging location?    Answer:   [facility], PA-C 02/01/22  ADDENDUM: Hematology/Oncology Attending: I had a face-to-face encounter with the patient today.  I reviewed his record, lab, scan and recommended his care plan.  This is a very pleasant 56 years old African-American male with stage IVb Hodgkin's lymphoma, nodular sclerosis subtype presented with bulky left cervical and supraclavicular lymphadenopathy as well as involvement of the spleen and nodal metastasis in the porta hepatis and extensive disease involving the bone marrow of the pelvis, spine and ribs diagnosed in September 2020 with IPS score of 4. The patient is status post systemic chemotherapy with brentuximab vedotin, doxorubicin, vinblastine and dacarbazine status post 6 cycles.  He has been on observation since April 2021 and has been doing fine. In May 2022 he was found to have enlarging right cervical lymph node which was concerning for disease recurrence but the PET scan performed at that time showed no significant activity or disease recurrence and the patient was seen by ENT in October and surgical resection showed mildly enlarged and more simple cystic mass of the right parotid and this was excised in October 2022 and showed lymphoepithelial cyst involving the salivary gland. The patient has been on observation since that time and he presented today for evaluation with repeat CT scan of the neck, chest, abdomen and pelvis.  Unfortunately his scan showed evidence for disease recurrence in the neck area with enlarged lymphadenopathy in the neck bilaterally.  He has been complaining of weight loss and not eating well but he drinks a lot of alcohol. I had a lengthy discussion with the patient and his wife today about his current condition and further investigation to rule out any disease recurrence. I recommended for the patient to have a PET scan for further evaluation of his  disease.  If the PET scan is positive, will consider The patient for repeat biopsy followed by discussion of systemic therapy or radiation if needed. The patient and his wife are in agreement with the current plan. He was advised to call immediately if he has any other concerning issues in the interval.  The total time spent in the appointment was 30 minutes. Disclaimer: This note was dictated with voice recognition software. Similar sounding words can inadvertently be transcribed and may be missed upon review. Eilleen Kempf, MD

## 2022-02-01 ENCOUNTER — Encounter: Payer: Self-pay | Admitting: Physician Assistant

## 2022-02-01 ENCOUNTER — Other Ambulatory Visit: Payer: Self-pay

## 2022-02-01 ENCOUNTER — Inpatient Hospital Stay: Payer: Medicaid Other

## 2022-02-01 ENCOUNTER — Inpatient Hospital Stay (HOSPITAL_BASED_OUTPATIENT_CLINIC_OR_DEPARTMENT_OTHER): Payer: Medicaid Other | Admitting: Physician Assistant

## 2022-02-01 VITALS — BP 118/72 | HR 61 | Temp 98.2°F | Resp 16 | Ht 66.0 in | Wt 131.7 lb

## 2022-02-01 DIAGNOSIS — Z79899 Other long term (current) drug therapy: Secondary | ICD-10-CM | POA: Diagnosis not present

## 2022-02-01 DIAGNOSIS — G629 Polyneuropathy, unspecified: Secondary | ICD-10-CM | POA: Diagnosis not present

## 2022-02-01 DIAGNOSIS — R634 Abnormal weight loss: Secondary | ICD-10-CM | POA: Diagnosis not present

## 2022-02-01 DIAGNOSIS — K769 Liver disease, unspecified: Secondary | ICD-10-CM | POA: Diagnosis not present

## 2022-02-01 DIAGNOSIS — R590 Localized enlarged lymph nodes: Secondary | ICD-10-CM | POA: Diagnosis not present

## 2022-02-01 DIAGNOSIS — C8118 Nodular sclerosis classical Hodgkin lymphoma, lymph nodes of multiple sites: Secondary | ICD-10-CM | POA: Diagnosis not present

## 2022-02-01 DIAGNOSIS — M47812 Spondylosis without myelopathy or radiculopathy, cervical region: Secondary | ICD-10-CM | POA: Diagnosis not present

## 2022-02-01 DIAGNOSIS — N400 Enlarged prostate without lower urinary tract symptoms: Secondary | ICD-10-CM | POA: Diagnosis not present

## 2022-02-01 DIAGNOSIS — C8111 Nodular sclerosis classical Hodgkin lymphoma, lymph nodes of head, face, and neck: Secondary | ICD-10-CM | POA: Diagnosis present

## 2022-02-01 DIAGNOSIS — J432 Centrilobular emphysema: Secondary | ICD-10-CM | POA: Diagnosis not present

## 2022-02-01 DIAGNOSIS — I700 Atherosclerosis of aorta: Secondary | ICD-10-CM | POA: Diagnosis not present

## 2022-02-01 DIAGNOSIS — Z95828 Presence of other vascular implants and grafts: Secondary | ICD-10-CM

## 2022-02-01 DIAGNOSIS — Z9221 Personal history of antineoplastic chemotherapy: Secondary | ICD-10-CM | POA: Diagnosis not present

## 2022-02-01 MED ORDER — SODIUM CHLORIDE 0.9% FLUSH
10.0000 mL | INTRAVENOUS | Status: DC | PRN
  Administered 2022-02-01: 10 mL

## 2022-02-01 MED ORDER — GABAPENTIN 100 MG PO CAPS: 100.0000 mg | ORAL_CAPSULE | Freq: Three times a day (TID) | ORAL | 2 refills | Status: DC

## 2022-02-01 MED ORDER — HEPARIN SOD (PORK) LOCK FLUSH 100 UNIT/ML IV SOLN
500.0000 [IU] | Freq: Once | INTRAVENOUS | Status: AC | PRN
  Administered 2022-02-01: 500 [IU]

## 2022-02-05 ENCOUNTER — Other Ambulatory Visit: Payer: Self-pay | Admitting: Physician Assistant

## 2022-02-10 ENCOUNTER — Telehealth: Payer: Self-pay | Admitting: Internal Medicine

## 2022-02-10 ENCOUNTER — Ambulatory Visit (HOSPITAL_COMMUNITY)
Admission: RE | Admit: 2022-02-10 | Discharge: 2022-02-10 | Disposition: A | Discharge status: Home / Self Care | Payer: Medicaid Other | Source: Ambulatory Visit | Attending: Physician Assistant | Admitting: Physician Assistant

## 2022-02-10 DIAGNOSIS — C8110 Nodular sclerosis classical Hodgkin lymphoma, unspecified site: Secondary | ICD-10-CM | POA: Diagnosis present

## 2022-02-10 DIAGNOSIS — C8118 Nodular sclerosis classical Hodgkin lymphoma, lymph nodes of multiple sites: Secondary | ICD-10-CM | POA: Insufficient documentation

## 2022-02-10 LAB — GLUCOSE, CAPILLARY: Glucose-Capillary: 105 mg/dL — ABNORMAL HIGH (ref 70–99)

## 2022-02-10 MED ORDER — FLUDEOXYGLUCOSE F - 18 (FDG) INJECTION
6.4300 | Freq: Once | INTRAVENOUS | Status: AC
  Administered 2022-02-10: 6.43 via INTRAVENOUS

## 2022-02-10 NOTE — Telephone Encounter (Signed)
Called patient regarding upcoming July appointments, patient has been called and notified.  

## 2022-02-18 ENCOUNTER — Inpatient Hospital Stay: Payer: Medicaid Other | Attending: Medical

## 2022-02-18 ENCOUNTER — Inpatient Hospital Stay (HOSPITAL_BASED_OUTPATIENT_CLINIC_OR_DEPARTMENT_OTHER): Payer: Medicaid Other | Admitting: Internal Medicine

## 2022-02-18 ENCOUNTER — Other Ambulatory Visit: Payer: Self-pay

## 2022-02-18 VITALS — BP 128/91 | HR 65 | Temp 97.0°F | Resp 17 | Wt 129.2 lb

## 2022-02-18 DIAGNOSIS — M47812 Spondylosis without myelopathy or radiculopathy, cervical region: Secondary | ICD-10-CM | POA: Insufficient documentation

## 2022-02-18 DIAGNOSIS — I700 Atherosclerosis of aorta: Secondary | ICD-10-CM | POA: Insufficient documentation

## 2022-02-18 DIAGNOSIS — J439 Emphysema, unspecified: Secondary | ICD-10-CM | POA: Diagnosis not present

## 2022-02-18 DIAGNOSIS — C8114 Nodular sclerosis classical Hodgkin lymphoma, lymph nodes of axilla and upper limb: Secondary | ICD-10-CM

## 2022-02-18 DIAGNOSIS — C8111 Nodular sclerosis classical Hodgkin lymphoma, lymph nodes of head, face, and neck: Secondary | ICD-10-CM | POA: Diagnosis present

## 2022-02-18 DIAGNOSIS — Z79899 Other long term (current) drug therapy: Secondary | ICD-10-CM | POA: Insufficient documentation

## 2022-02-18 DIAGNOSIS — R634 Abnormal weight loss: Secondary | ICD-10-CM | POA: Insufficient documentation

## 2022-02-18 DIAGNOSIS — K7689 Other specified diseases of liver: Secondary | ICD-10-CM | POA: Insufficient documentation

## 2022-02-18 DIAGNOSIS — N400 Enlarged prostate without lower urinary tract symptoms: Secondary | ICD-10-CM | POA: Insufficient documentation

## 2022-02-18 DIAGNOSIS — J432 Centrilobular emphysema: Secondary | ICD-10-CM | POA: Diagnosis not present

## 2022-02-18 DIAGNOSIS — C8118 Nodular sclerosis classical Hodgkin lymphoma, lymph nodes of multiple sites: Secondary | ICD-10-CM

## 2022-02-18 LAB — CMP (CANCER CENTER ONLY)
ALT: 31 U/L (ref 0–44)
AST: 36 U/L (ref 15–41)
Albumin: 4.3 g/dL (ref 3.5–5.0)
Alkaline Phosphatase: 92 U/L (ref 38–126)
Anion gap: 7 (ref 5–15)
BUN: 20 mg/dL (ref 6–20)
CO2: 27 mmol/L (ref 22–32)
Calcium: 9.3 mg/dL (ref 8.9–10.3)
Chloride: 102 mmol/L (ref 98–111)
Creatinine: 0.81 mg/dL (ref 0.61–1.24)
GFR, Estimated: >60 mL/min (ref >60)
Glucose, Bld: 79 mg/dL (ref 70–99)
Potassium: 3.7 mmol/L (ref 3.5–5.1)
Sodium: 136 mmol/L (ref 135–145)
Total Bilirubin: 0.6 mg/dL (ref 0.3–1.2)
Total Protein: 7.8 g/dL (ref 6.5–8.1)

## 2022-02-18 LAB — CBC WITH DIFFERENTIAL (CANCER CENTER ONLY)
Abs Immature Granulocytes: 0 10*3/uL (ref 0.00–0.07)
Basophils Absolute: 0 10*3/uL (ref 0.0–0.1)
Basophils Relative: 1 %
Eosinophils Absolute: 0.1 10*3/uL (ref 0.0–0.5)
Eosinophils Relative: 1 %
HCT: 41.1 % (ref 39.0–52.0)
Hemoglobin: 14.2 g/dL (ref 13.0–17.0)
Immature Granulocytes: 0 %
Lymphocytes Relative: 34 %
Lymphs Abs: 1.5 10*3/uL (ref 0.7–4.0)
MCH: 30.9 pg (ref 26.0–34.0)
MCHC: 34.5 g/dL (ref 30.0–36.0)
MCV: 89.5 fL (ref 80.0–100.0)
Monocytes Absolute: 0.4 10*3/uL (ref 0.1–1.0)
Monocytes Relative: 10 %
Neutro Abs: 2.3 10*3/uL (ref 1.7–7.7)
Neutrophils Relative %: 54 %
Platelet Count: 278 10*3/uL (ref 150–400)
RBC: 4.59 MIL/uL (ref 4.22–5.81)
RDW: 12.5 % (ref 11.5–15.5)
WBC Count: 4.3 10*3/uL (ref 4.0–10.5)
nRBC: 0 % (ref 0.0–0.2)

## 2022-02-18 LAB — FOLATE: Folate: 17 ng/mL (ref >5.9)

## 2022-02-18 LAB — LACTATE DEHYDROGENASE: LDH: 158 U/L (ref 98–192)

## 2022-02-18 LAB — VITAMIN B12: Vitamin B-12: 560 pg/mL (ref 180–914)

## 2022-02-18 NOTE — Progress Notes (Signed)
[facility] [phone]   [phone]  OFFICE PROGRESS NOTE  Vonna Drafts, FNP [address] [place] [address]  DIAGNOSIS: Stage IVB Hodgkin's Lymphoma, nodular sclerosis subtype. He presented with a bulky left cervical and supraclavicular lymphadenopathy as well as involvement of the spleen, nodal disease in the porta hepatis, and extensive disease involving the marrow of the pelvis, spine, and ribs. He was diagnosed in September 2020.    IPS score: 4    PRIOR THERAPY: Systemic chemotherapy with brentuximab vedotin 1.2 mg/kg, doxorubicin 25 mg/m2, vinblastine 6 mg/m2, dacarbazine 375 mg/m2 on days 1 and 15 every 28 days. First dose expected on 05/30/2019.   Status post 6 cycles.    CURRENT THERAPY:  Observation.  INTERVAL HISTORY: Jesse [name] 56 y.o. male returns to the clinic today for follow-up visit.  The patient continues to complain of increasing fatigue and weight loss.  He has no palpable lymphadenopathy in the neck area and he felt that the lymph nodes in the left side of the neck has improved.  He denied having any current chest pain, shortness of breath, cough or hemoptysis.  He denied having any fever or chills.  He has no nausea, vomiting, diarrhea or constipation.  He has no headache or visual changes.  He was found on previous CT scan of the neck to have enlargement of the lymphadenopathy in the neck area.  The patient had a PET scan performed recently and he is here for evaluation and discussion of his PET scan results.  MEDICAL HISTORY: Past Medical History:  Diagnosis Date   ETOH abuse    hodgkins lymphoma 2020   Lymphadenopathy    left   Marijuana abuse     ALLERGIES:  has No Known Allergies.  MEDICATIONS:  Current Outpatient Medications  Medication Sig Dispense Refill   gabapentin (NEURONTIN) 100 MG capsule Take 1 capsule (100 mg total) by mouth 3 (three) times daily. 90 capsule 2    lidocaine (LIDODERM) 5 % Place 1 patch onto the skin daily. Remove & Discard patch within 12 hours or as directed by MD (Patient not taking: Reported on 02/01/2022) 30 patch 0   lidocaine-prilocaine (EMLA) cream Apply 1 application topically as needed. Apply to port site  45 minutes prior to port being accessed (Patient not taking: Reported on 02/01/2022) 30 g 1   methylPREDNISolone (MEDROL DOSEPAK) 4 MG TBPK tablet Per packet instructions (Patient not taking: Reported on 02/01/2022) 21 each 0   naproxen sodium (ALEVE) 220 MG tablet Take 660 mg by mouth daily as needed (pain). (Patient not taking: Reported on 02/01/2022)     oxyCODONE-acetaminophen (PERCOCET) 7.5-325 MG tablet Take 1 tablet by mouth every 6 (six) hours. (Patient not taking: Reported on 02/01/2022)     prochlorperazine (COMPAZINE) 10 MG tablet Take 1 tablet (10 mg total) by mouth every 8 (eight) hours as needed for nausea or vomiting. (Patient not taking: Reported on 02/01/2022) 30 tablet 2   No current facility-administered medications for this visit.   Facility-Administered Medications Ordered in Other Visits  Medication Dose Route Frequency Provider Last Rate Last Admin   sodium chloride flush (NS) 0.9 % injection 10 mL  10 mL Intracatheter PRN Curt Bears, MD   10 mL at 07/01/21 1551    SURGICAL HISTORY:  Past Surgical History:  Procedure Laterality Date   HERNIA REPAIR     as child   IR IMAGING GUIDED PORT INSERTION  05/29/2019   LYMPH NODE BIOPSY Left 05/07/2019   Procedure: LYMPH NODE BIOPSY;  Surgeon: Melida Quitter, MD;  Location: [facility];  Service: ENT;  Laterality: Left;    REVIEW OF SYSTEMS:  Constitutional: positive for fatigue and weight loss Eyes: negative Ears, nose, mouth, throat, and face: negative Respiratory: negative Cardiovascular: negative Gastrointestinal: negative Genitourinary:negative Integument/breast: negative Hematologic/lymphatic:  negative Musculoskeletal:negative Neurological: negative Behavioral/Psych: negative Endocrine: negative Allergic/Immunologic: negative   PHYSICAL EXAMINATION: General appearance: alert, cooperative, fatigued, and no distress Head: Normocephalic, without obvious abnormality, atraumatic Neck: no adenopathy, no JVD, supple, symmetrical, trachea midline, and thyroid not enlarged, symmetric, no tenderness/mass/nodules Lymph nodes: Cervical, supraclavicular, and axillary nodes normal. Resp: clear to auscultation bilaterally Back: symmetric, no curvature. ROM normal. No CVA tenderness. Cardio: regular rate and rhythm, S1, S2 normal, no murmur, click, rub or gallop GI: soft, non-tender; bowel sounds normal; no masses,  no organomegaly Extremities: extremities normal, atraumatic, no cyanosis or edema Neurologic: Alert and oriented X 3, normal strength and tone. Normal symmetric reflexes. Normal coordination and gait  ECOG PERFORMANCE STATUS: 1 - Symptomatic but completely ambulatory  Blood pressure (!) 128/91, pulse 65, temperature (!) 97 F (36.1 C), temperature source Tympanic, resp. rate 17, weight 129 lb 3 oz (58.6 kg), SpO2 99 %.  LABORATORY DATA: Lab Results  Component Value Date   WBC 4.3 02/18/2022   HGB 14.2 02/18/2022   HCT 41.1 02/18/2022   MCV 89.5 02/18/2022   PLT 278 02/18/2022      Chemistry      Component Value Date/Time   NA 136 02/18/2022 1347   K 3.7 02/18/2022 1347   CL 102 02/18/2022 1347   CO2 27 02/18/2022 1347   BUN 20 02/18/2022 1347   CREATININE 0.81 02/18/2022 1347      Component Value Date/Time   CALCIUM 9.3 02/18/2022 1347   ALKPHOS 92 02/18/2022 1347   AST 36 02/18/2022 1347   ALT 31 02/18/2022 1347   BILITOT 0.6 02/18/2022 1347       RADIOGRAPHIC STUDIES: NM PET Image Restag (PS) Skull Base To Thigh  Result Date: 02/11/2022 CLINICAL DATA:  Subsequent treatment strategy for Hodgkin's lymphoma; enlarging cervical lymph nodes EXAM: NUCLEAR  MEDICINE PET SKULL BASE TO THIGH TECHNIQUE: 6.4 mCi F-18 FDG was injected intravenously. Full-ring PET imaging was performed from the skull base to thigh after the radiotracer. CT data was obtained and used for attenuation correction and anatomic localization. Fasting blood glucose: 105 mg/dl COMPARISON:  CT chest, abdomen and pelvis dated January 27, 2022; PET-CT dated December 10, 2020 FINDINGS: Mediastinal blood pool activity: SUV max 1.7 Liver activity: SUV max 2.6 NECK: Bilateral enlarged cervical lymph nodes which demonstrate hypermetabolic activity. Reference left level 2 cervical lymph node measuring 1.4 cm in short axis on series 10, image 35 with SUV max of 3.1. New focal hypermetabolic uptake of the posterior left thyroid gland. Incidental CT findings: none CHEST: Hypermetabolic enlarged left axillary lymph node measuring 1.1 cm in short axis on series 10 image 54, previously measured 0.5 cm on January 27, 2022 exam with new hypermetabolic uptake, SUV max of 3.8. No hypermetabolic mediastinal or hilar nodes. No suspicious pulmonary nodules on the CT scan. Incidental CT findings: Left main and three-vessel coronary artery calcifications. Atherosclerotic disease of the thoracic aorta. Right chest wall port with tip near the superior cavoatrial junction ABDOMEN/PELVIS: Ill-defined lesion of the posterior right hepatic lobe, likely due to benign hemangioma when compared with prior contrast enhanced exam. Simple appearing cyst  of the central liver, unchanged when compared with prior exam. Atherosclerotic disease of the abdominal aorta. Incidental CT findings: none SKELETON: No focal hypermetabolic activity to suggest skeletal metastasis. Incidental CT findings: Soft tissue lesion in the left gluteus musculature with mild FDG uptake measuring 2.6 x 1.4 cm with associated macroscopic fat, increased in size when compared with April 18, 2019 prior exam, previously measured 2.1 x 7.4 cm. IMPRESSION: 1. Enlarged and  hypermetabolic bilateral cervical and left axillary lymph nodes, concerning for recurrent disease. 2. New focal hypermetabolic uptake of the posterior left thyroid gland. Recommend thyroid ultrasound for further evaluation. 3. 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 Recommend further evaluation with contrast-enhanced MRI. Electronically Signed   By: Yetta Glassman M.D.   On: 02/11/2022 14:35   CT Abdomen Pelvis W Contrast  Result Date: 01/28/2022 CLINICAL DATA:  Restaging metastatic disease of unknown primary. History of lymphoma (nodular sclerosing Hodgkin's lymphoma) in 2020. * Tracking Code: BO * EXAM: CT CHEST, ABDOMEN, AND PELVIS WITH CONTRAST TECHNIQUE: Multidetector CT imaging of the chest, abdomen and pelvis was performed following the standard protocol during bolus administration of intravenous contrast. RADIATION DOSE REDUCTION: This exam was performed according to the departmental dose-optimization program which includes automated exposure control, adjustment of the mA and/or kV according to patient size and/or use of iterative reconstruction technique. CONTRAST:  123m OMNIPAQUE IOHEXOL 300 MG/ML  SOLN COMPARISON:  Prior CTs 06/25/2021 and 06/03/2020. PET-CT 12/10/2020. FINDINGS: CT CHEST FINDINGS Cardiovascular: No acute vascular findings. Right IJ Port-A-Cath extends to the superior cavoatrial junction. There is atherosclerosis of the aorta, great vessels and coronary arteries. The heart size is normal. There is no pericardial effusion. Mediastinum/Nodes: There are no enlarged mediastinal, hilar or axillary lymph nodes. Small axillary lymph nodes are unchanged. The thyroid gland, trachea and esophagus demonstrate no significant findings. Lungs/Pleura: No pleural effusion or pneumothorax. Stable mild centrilobular emphysema and  central airway thickening with new patchy ground-glass opacity inferiorly in the right upper lobe (image 53/1), likely inflammatory/infectious. No suspicious pulmonary nodules. Musculoskeletal/Chest wall: No chest wall mass or suspicious osseous findings. CT ABDOMEN AND PELVIS FINDINGS Hepatobiliary: The liver appears stable without suspicious lesion. There is a stable dominant 3.9 cm hemangioma in segment 7 (image 53/3), as well as additional flash filling hemangiomas and small cysts. No evidence of gallstones, gallbladder wall thickening or biliary dilatation. Pancreas: Unremarkable. No pancreatic ductal dilatation or surrounding inflammatory changes. Spleen: Normal in size without focal abnormality. Adrenals/Urinary Tract: Both adrenal glands appear normal. The kidneys appear normal without evidence of urinary tract calculus, suspicious lesion or hydronephrosis. The bladder appears normal for its degree of distention. Stomach/Bowel: Enteric contrast was administered and has passed into the mid colon. The stomach appears unremarkable for its degree of distension. No evidence of bowel wall thickening, distention or surrounding inflammatory change. Vascular/Lymphatic: Retroperitoneal assessment limited by the paucity of retroperitoneal fat. No enlarged abdominopelvic lymph nodes are identified. There is aortic and branch vessel atherosclerosis without evidence of aneurysm or large vessel occlusion. Reproductive: Stable enlargement of the prostate gland. Other: No evidence of abdominal wall mass or hernia. No ascites. Musculoskeletal: No acute or significant osseous findings. IMPRESSION: 1. New patchy ground-glass pulmonary opacities inferiorly in the right upper lobe, likely infectious inflammatory. 2. Otherwise stable examinations without adenopathy or other findings suspicious for malignancy in the chest, abdomen or pelvis. 3. Stable  incidental benign hepatic lesions. 4. Coronary and aortic atherosclerosis  (ICD10-I70.0). Emphysema (ICD10-J43.9). Electronically Signed   By: Richardean Sale M.D.   On: 01/28/2022 17:17   CT Chest W Contrast  Result Date: 01/28/2022 CLINICAL DATA:  Restaging metastatic disease of unknown primary. History of lymphoma (nodular sclerosing Hodgkin's lymphoma) in 2020. * Tracking Code: BO * EXAM: CT CHEST, ABDOMEN, AND PELVIS WITH CONTRAST TECHNIQUE: Multidetector CT imaging of the chest, abdomen and pelvis was performed following the standard protocol during bolus administration of intravenous contrast. RADIATION DOSE REDUCTION: This exam was performed according to the departmental dose-optimization program which includes automated exposure control, adjustment of the mA and/or kV according to patient size and/or use of iterative reconstruction technique. CONTRAST:  133m OMNIPAQUE IOHEXOL 300 MG/ML  SOLN COMPARISON:  Prior CTs 06/25/2021 and 06/03/2020. PET-CT 12/10/2020. FINDINGS: CT CHEST FINDINGS Cardiovascular: No acute vascular findings. Right IJ Port-A-Cath extends to the superior cavoatrial junction. There is atherosclerosis of the aorta, great vessels and coronary arteries. The heart size is normal. There is no pericardial effusion. Mediastinum/Nodes: There are no enlarged mediastinal, hilar or axillary lymph nodes. Small axillary lymph nodes are unchanged. The thyroid gland, trachea and esophagus demonstrate no significant findings. Lungs/Pleura: No pleural effusion or pneumothorax. Stable mild centrilobular emphysema and central airway thickening with new patchy ground-glass opacity inferiorly in the right upper lobe (image 53/1), likely inflammatory/infectious. No suspicious pulmonary nodules. Musculoskeletal/Chest wall: No chest wall mass or suspicious osseous findings. CT ABDOMEN AND PELVIS FINDINGS Hepatobiliary: The liver appears stable without suspicious lesion. There is a stable dominant 3.9 cm hemangioma in segment 7 (image 53/3), as well as additional flash filling  hemangiomas and small cysts. No evidence of gallstones, gallbladder wall thickening or biliary dilatation. Pancreas: Unremarkable. No pancreatic ductal dilatation or surrounding inflammatory changes. Spleen: Normal in size without focal abnormality. Adrenals/Urinary Tract: Both adrenal glands appear normal. The kidneys appear normal without evidence of urinary tract calculus, suspicious lesion or hydronephrosis. The bladder appears normal for its degree of distention. Stomach/Bowel: Enteric contrast was administered and has passed into the mid colon. The stomach appears unremarkable for its degree of distension. No evidence of bowel wall thickening, distention or surrounding inflammatory change. Vascular/Lymphatic: Retroperitoneal assessment limited by the paucity of retroperitoneal fat. No enlarged abdominopelvic lymph nodes are identified. There is aortic and branch vessel atherosclerosis without evidence of aneurysm or large vessel occlusion. Reproductive: Stable enlargement of the prostate gland. Other: No evidence of abdominal wall mass or hernia. No ascites. Musculoskeletal: No acute or significant osseous findings. IMPRESSION: 1. New patchy ground-glass pulmonary opacities inferiorly in the right upper lobe, likely infectious inflammatory. 2. Otherwise stable examinations without adenopathy or other findings suspicious for malignancy in the chest, abdomen or pelvis. 3. Stable incidental benign hepatic lesions. 4. Coronary and aortic atherosclerosis (ICD10-I70.0). Emphysema (ICD10-J43.9). Electronically Signed   By: WRichardean SaleM.D.   On: 01/28/2022 17:17   CT Soft Tissue Neck W Contrast  Result Date: 01/28/2022 CLINICAL DATA:  History of lymphoma.  Follow-up. EXAM: CT NECK WITH CONTRAST TECHNIQUE: Multidetector CT imaging of the neck was performed using the standard protocol following the bolus administration of intravenous contrast. RADIATION DOSE REDUCTION: This exam was performed according to the  departmental dose-optimization program which includes automated exposure control, adjustment of the mA and/or kV according to patient size and/or use of iterative reconstruction technique. CONTRAST:  1060mOMNIPAQUE IOHEXOL 300 MG/ML  SOLN COMPARISON:  05/21/2021 FINDINGS: Pharynx and larynx: No evidence of mucosal or submucosal lesion. Salivary  glands: Parotid and submandibular glands are normal today. Previously seen cystic abnormality at or adjacent to the right parotid tail is no longer present. Submandibular glands are normal. Thyroid: Normal Lymph nodes: On the right, there is enlargement of a lymph node at the level 2 station just anterior to the jugular vein measuring 2.5 cm in length with a transverse diameter of 14 x 15 mm. Additional right posterior triangle level 5 nodes have gotten larger and show enhancement. Short axis dimension of those nodes are 7 mm or less, but there are suspicious based on their interval enlargement and enhancing nature. On the left, there is a level 2 lymph node which has enlarged since the prior study, short axis dimension 13 x 17 mm with length of 2 cm, suspicious for involvement. Previously treated nodes in the posterior chain on the left remain visible, showing low-density without enhancement, therefore not suspicious for active involvement. Vascular: Arterial and venous structures are patent and normal. Limited intracranial: Normal Visualized orbits: Normal Mastoids and visualized paranasal sinuses: Clear Skeleton: Ordinary cervical spondylosis. Upper chest: Normal Other: None IMPRESSION: Recurrent lymphadenopathy in the right neck. Largest single node is a right level 2 node measuring 25 x 14 x 15 mm. Enlargement also of posterior triangle level 5 nodes on the right suspicious for involvement. Enlargement of a level 2 node on the left, short axis dimension 13 x 17 mm with length of 2 cm, worrisome for recurrent disease. Other previously treated posterior triangle nodes on  the left do not show interval enlargement or enhancement when compared to the prior exam. Electronically Signed   By: Nelson Chimes M.D.   On: 01/28/2022 11:24     ASSESSMENT AND PLAN: This is a very pleasant 56 years old African-American male recently diagnosed with a stage IV non-Hodgkin lymphoma, nodular sclerosing subtype.  The patient is currently undergoing systemic chemotherapy with brentuximab Vedotin in addition to doxorubicin, vinblastine and dacarbazine.  Status post 6 cycles.  The patient tolerated his treatment fairly well with no concerning adverse effects except for fatigue.   His last PET scan after the treatment showed almost complete resolution of his disease with no hypermetabolic activity in the neck, chest, abdomen pelvis or the osseous structures.  He has Deauville1. The patient has been on observation since that time and he is feeling fine except for increasing fatigue and recent weight loss. He was found on previous CT scan of the neck on January 27, 2022 to have suspicious recurrent lymphadenopathy in the right neck with the largest lymph node measuring 2.4 x 1.4 x 1.5 cm at the right level 2.  There was also enlargement of a level 2 node on the left measuring 1.3 x 1.7 x 2.0 cm again worrisome for recurrent disease. The patient had a PET scan on 02/10/2022 and he is here for evaluation and discussion of his scan results.  His PET scan showed bilateral enlarged cervical lymph nodes which demonstrate hypermetabolic activity with a reference left level 2 cervical lymph node measuring 1.4 cm in short axis with SUV max of 3.1.  There was also hypermetabolic enlarged left axillary lymph node measuring 1.1 cm with SUV max of 3.8.  There was no hypermetabolic mediastinal or hilar lymph nodes and no suspicious pulmonary nodules.  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 I had  a lengthy discussion with the patient today about his condition and further investigation to rule out disease recurrence. I recommended for the patient to have ultrasound-guided core biopsy of the hypermetabolic left axillary lymph node. I will see the patient back for follow-up visit in around 3 weeks for evaluation and discussion of his biopsy results and further recommendation regarding his condition. For the weight loss, I strongly encouraged the patient to increase his oral intake with supplemental nutrition. He was advised to call immediately if he has any other concerning symptoms in the interval. The patient voices understanding of current disease status and treatment options and is in agreement with the current care plan. All questions were answered. The patient knows to call the clinic with any problems, questions or concerns. We can certainly see the patient much sooner if necessary.  Disclaimer: This note was dictated with voice recognition software. Similar sounding words can inadvertently be transcribed and may not be corrected upon review.

## 2022-02-23 ENCOUNTER — Telehealth: Payer: Self-pay

## 2022-02-23 ENCOUNTER — Telehealth: Payer: Self-pay | Admitting: Internal Medicine

## 2022-02-23 NOTE — Telephone Encounter (Signed)
Called IR and spoke with Tiffany regarding Node BX. Per Tiffany, Central scheduling schedules this procedure. Tiffany flagged the chart for CS. I called pt's wife to advise. She will reach out to office if she has not heard anything by end of business on 02/24/22.

## 2022-02-23 NOTE — Progress Notes (Unsigned)
Jesse Edouard, MD  Donita Brooks D OK for US guided left axillary LN biopsy.   GY

## 2022-02-23 NOTE — Telephone Encounter (Signed)
Scheduled per 07/06 los, patient has been called and notified of upcoming appointments. 

## 2022-02-23 NOTE — Telephone Encounter (Signed)
Called pt 's wife to relay the following instructions obtained from Central Scheduling:  Advised patient that the patient cannot drive 24 hrs after bx, no alcohol, cannot sign legal doc's and if they have small children they need to make arrangements for someone to take care of them as well,  Wife verbalized pre bx instructions provided by schedulers regarding arrival time and NPO status.

## 2022-03-03 ENCOUNTER — Other Ambulatory Visit: Payer: Self-pay | Admitting: Student

## 2022-03-03 DIAGNOSIS — R590 Localized enlarged lymph nodes: Secondary | ICD-10-CM

## 2022-03-04 ENCOUNTER — Ambulatory Visit (HOSPITAL_COMMUNITY)
Admission: RE | Admit: 2022-03-04 | Discharge: 2022-03-04 | Disposition: A | Discharge status: Home / Self Care | Payer: Medicaid Other | Source: Ambulatory Visit | Attending: Internal Medicine | Admitting: Internal Medicine

## 2022-03-04 ENCOUNTER — Encounter (HOSPITAL_COMMUNITY): Payer: Self-pay

## 2022-03-04 DIAGNOSIS — C8190 Hodgkin lymphoma, unspecified, unspecified site: Secondary | ICD-10-CM | POA: Diagnosis present

## 2022-03-04 DIAGNOSIS — R590 Localized enlarged lymph nodes: Secondary | ICD-10-CM

## 2022-03-04 DIAGNOSIS — Z9221 Personal history of antineoplastic chemotherapy: Secondary | ICD-10-CM | POA: Diagnosis not present

## 2022-03-04 DIAGNOSIS — C8114 Nodular sclerosis classical Hodgkin lymphoma, lymph nodes of axilla and upper limb: Secondary | ICD-10-CM

## 2022-03-04 MED ORDER — FENTANYL CITRATE (PF) 100 MCG/2ML IJ SOLN
INTRAMUSCULAR | Status: AC | PRN
  Administered 2022-03-04: 50 ug via INTRAVENOUS

## 2022-03-04 MED ORDER — SODIUM CHLORIDE 0.9 % IV SOLN: INTRAVENOUS | Status: DC

## 2022-03-04 MED ORDER — MIDAZOLAM HCL 2 MG/2ML IJ SOLN
INTRAMUSCULAR | Status: AC
  Filled 2022-03-04: qty 4

## 2022-03-04 MED ORDER — MIDAZOLAM HCL 2 MG/2ML IJ SOLN
INTRAMUSCULAR | Status: AC | PRN
  Administered 2022-03-04: 1 mg via INTRAVENOUS

## 2022-03-04 MED ORDER — LIDOCAINE HCL 1 % IJ SOLN
INTRAMUSCULAR | Status: AC | PRN
  Administered 2022-03-04: 10 mL via INTRADERMAL

## 2022-03-04 MED ORDER — FENTANYL CITRATE (PF) 100 MCG/2ML IJ SOLN
INTRAMUSCULAR | Status: AC
  Filled 2022-03-04: qty 2

## 2022-03-04 MED ORDER — LIDOCAINE HCL 1 % IJ SOLN
INTRAMUSCULAR | Status: AC
  Filled 2022-03-04: qty 20

## 2022-03-04 NOTE — Procedures (Signed)
Interventional Radiology Procedure Note  Procedure: [place] LEFT AX NODE CORE BXS    Complications: None  Estimated Blood Loss:  MIN  Findings: 18 G CORES FULL REPORT IN PACS    Tamera Punt, MD

## 2022-03-04 NOTE — H&P (Signed)
Chief Complaint: Patient was seen in consultation today for stage IV Hodgkin's lymphoma at the request of [place]  Referring Physician(s): Vonna Drafts  Supervising Physician: Daryll Brod  Patient Status: [facility] - Out-pt  History of Present Illness: Jesse [name] is a 56 y.o. 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   He was seen in follow up 2 weeks ago with complaints of fatigue and weight loss.  His most recent PET showed hypermetabolic activity of several nodes including a hypermetabolic enlarged left axillary lymph node measuring 1.1 cm with SUV max of 3.8.   He has been referred to IR today for lymph node biopsy.  He reports feeling well today, denies HA, Fever/chills, swelling on the body, SOB, CP, Abd pain, change in bowel/urinary habits, changes in gait.  Past Medical History:  Diagnosis Date   ETOH abuse    hodgkins lymphoma 2020   Lymphadenopathy    left   Marijuana abuse     Past Surgical History:  Procedure Laterality Date   HERNIA REPAIR     as child   IR IMAGING GUIDED PORT INSERTION  05/29/2019   LYMPH NODE BIOPSY Left 05/07/2019   Procedure: LYMPH NODE BIOPSY;  Surgeon: Melida Quitter, MD;  Location: [facility];  Service: ENT;  Laterality: Left;    Allergies: Patient has no known allergies.  Medications: Prior to Admission medications   Medication Sig Start Date End Date Taking? Authorizing Provider  gabapentin (NEURONTIN) 100 MG capsule Take 1 capsule (100 mg total) by mouth 3 (three) times daily. 02/01/22   Heilingoetter, Cassandra L, PA-C  lidocaine (LIDODERM) 5 % Place 1 patch onto the skin daily. Remove & Discard patch within 12 hours or as directed by MD Patient not taking: Reported on 02/01/2022 12/01/20   Gareth Morgan, MD  lidocaine-prilocaine (EMLA) cream Apply 1 application topically as needed. Apply to port site  45 minutes prior  to port being accessed Patient not taking: Reported on 02/01/2022 06/12/19   Heilingoetter, Cassandra L, PA-C  methylPREDNISolone (MEDROL DOSEPAK) 4 MG TBPK tablet Per packet instructions Patient not taking: Reported on 02/01/2022 12/01/20   Gareth Morgan, MD  naproxen sodium (ALEVE) 220 MG tablet Take 660 mg by mouth daily as needed (pain). Patient not taking: Reported on 02/01/2022    [provider]  oxyCODONE-acetaminophen (PERCOCET) 7.5-325 MG tablet Take 1 tablet by mouth every 6 (six) hours. Patient not taking: Reported on 02/01/2022 12/05/20   [provider]  prochlorperazine (COMPAZINE) 10 MG tablet Take 1 tablet (10 mg total) by mouth every 8 (eight) hours as needed for nausea or vomiting. Patient not taking: Reported on 02/01/2022 06/12/19   Heilingoetter, Cassandra L, PA-C     Family History  Problem Relation Age of Onset   Cancer Mother    Cancer Father    Cancer Sister     Social History   Socioeconomic History   Marital status: Married    Spouse name: Not on file   Number of children: Not on file   Years of education: Not on file   Highest education level: Not on file  Occupational History   Not on file  Tobacco Use   Smoking status: Former    Packs/day: 1.00    Years: 20.00    Total pack years: 20.00    Types: Cigarettes    Quit date: 04/24/1999    Years since quitting: 35.8  Smokeless tobacco: Never  Vaping Use   Vaping Use: Never used  Substance and Sexual Activity   Alcohol use: Yes    Comment: 12 beers daily   Drug use: Yes    Types: Marijuana    Comment: daily   Sexual activity: Yes  Other Topics Concern   Not on file  Social History Narrative   Not on file   Social Determinants of Health   Financial Resource Strain: Not on file  Food Insecurity: Not on file  Transportation Needs: Not on file  Physical Activity: Not on file  Stress: Not on file  Social Connections: Not on file   Review of Systems: A 12 point ROS discussed  and pertinent positives are indicated in the HPI above.  All other systems are negative.  Vital Signs: Temp 97.7, BP 123/78, HR 58, RR16, SpO2 99%  Physical Exam Constitutional:      General: He is not in acute distress.    Appearance: Normal appearance. He is not ill-appearing.  HENT:     Head: Normocephalic and atraumatic.     Mouth/Throat:     Mouth: Mucous membranes are moist.     Pharynx: Oropharynx is clear.  Eyes:     Extraocular Movements: Extraocular movements intact.  Cardiovascular:     Rate and Rhythm: Regular rhythm.     Pulses: Normal pulses.     Heart sounds: Normal heart sounds.  Pulmonary:     Breath sounds: Normal breath sounds.  Abdominal:     General: Abdomen is flat.     Palpations: Abdomen is soft.  Skin:    General: Skin is warm and dry.  Neurological:     General: No focal deficit present.     Mental Status: He is alert and oriented to person, place, and time.  Psychiatric:        Mood and Affect: Mood normal.        Behavior: Behavior normal.     Imaging: NM PET Image Restag (PS) Skull Base To Thigh  Result Date: 02/11/2022 CLINICAL DATA:  Subsequent treatment strategy for Hodgkin's lymphoma; enlarging cervical lymph nodes EXAM: NUCLEAR MEDICINE PET SKULL BASE TO THIGH TECHNIQUE: 6.4 mCi F-18 FDG was injected intravenously. Full-ring PET imaging was performed from the skull base to thigh after the radiotracer. CT data was obtained and used for attenuation correction and anatomic localization. Fasting blood glucose: 105 mg/dl COMPARISON:  CT chest, abdomen and pelvis dated January 27, 2022; PET-CT dated December 10, 2020 FINDINGS: Mediastinal blood pool activity: SUV max 1.7 Liver activity: SUV max 2.6 NECK: Bilateral enlarged cervical lymph nodes which demonstrate hypermetabolic activity. Reference left level 2 cervical lymph node measuring 1.4 cm in short axis on series 10, image 35 with SUV max of 3.1. New focal hypermetabolic uptake of the posterior left  thyroid gland. Incidental CT findings: none CHEST: Hypermetabolic enlarged left axillary lymph node measuring 1.1 cm in short axis on series 10 image 54, previously measured 0.5 cm on January 27, 2022 exam with new hypermetabolic uptake, SUV max of 3.8. No hypermetabolic mediastinal or hilar nodes. No suspicious pulmonary nodules on the CT scan. Incidental CT findings: Left main and three-vessel coronary artery calcifications. Atherosclerotic disease of the thoracic aorta. Right chest wall port with tip near the superior cavoatrial junction ABDOMEN/PELVIS: Ill-defined lesion of the posterior right hepatic lobe, likely due to benign hemangioma when compared with prior contrast enhanced exam. Simple appearing cyst of the central liver, unchanged when compared with prior exam.  Atherosclerotic disease of the abdominal aorta. Incidental CT findings: none SKELETON: No focal hypermetabolic activity to suggest skeletal metastasis. Incidental CT findings: Soft tissue lesion in the left gluteus musculature with mild FDG uptake measuring 2.6 x 1.4 cm with associated macroscopic fat, increased in size when compared with April 18, 2019 prior exam, previously measured 2.1 x 7.4 cm. IMPRESSION: 1. Enlarged and hypermetabolic bilateral cervical and left axillary lymph nodes, concerning for recurrent disease. 2. New focal hypermetabolic uptake of the posterior left thyroid gland. Recommend thyroid ultrasound for further evaluation. 3. 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 Recommend further evaluation with contrast-enhanced MRI. Electronically Signed   By: Yetta Glassman M.D.   On: 02/11/2022 14:35    Labs:  CBC: Recent Labs    07/01/21 1601 01/27/22 1011 02/18/22 1347  WBC 3.9* 6.1 4.3  HGB 13.2 14.1 14.2  HCT 38.9* 41.1 41.1  PLT 265 243 278     COAGS: No results for input(s): "INR", "APTT" in the last 8760 hours.  BMP: Recent Labs    07/01/21 1601 01/27/22 1011 02/18/22 1347  NA 139 139 136  K 3.9 4.0 3.7  CL 103 105 102  CO2 '25 28 27  '$ GLUCOSE 81 105* 79  BUN '20 14 20  '$ CALCIUM 9.0 9.3 9.3  CREATININE 0.83 0.79 0.81  GFRNONAA >60 >60 >60    LIVER FUNCTION TESTS: Recent Labs    07/01/21 1601 01/27/22 1011 02/18/22 1347  BILITOT 0.2* 0.4 0.6  AST 25 21 36  ALT '25 20 31  '$ ALKPHOS 78 97 92  PROT 7.2 7.6 7.8  ALBUMIN 4.0 4.1 4.3    Assessment and Plan:  Hodgkin's lymphoma --recurrence of disease --here for biopsy of L axillary lymph node --labs, history, vital reviewed --OK to proceed with planned discharge home with wife this afternoon.  Risks and benefits of axillary node biopsy was discussed with the patient and/or patient's family including, but not limited to bleeding, infection, damage to adjacent structures or low yield requiring additional tests.  All of the questions were answered and there is agreement to proceed.  Consent signed and in chart.    Thank you for this interesting consult.  I greatly enjoyed meeting Jesse [name] and look forward to participating in their care.  A copy of this report was sent to the requesting provider on this date.  Electronically Signed: Pasty Spillers, PA 03/04/2022, 12:47 PM   I spent a total of 15 Minutes in face to face in clinical consultation, greater than 50% of which was counseling/coordinating care for Hodgkin's lymphoma

## 2022-03-04 NOTE — Progress Notes (Signed)
Spoke to [place], patients wife, and confirmed she is patients ride home.  Wife went home, informed her we will call her when patient returns from procedure with a d/c time.  Informed her patient tentatively will be ready for d/c around 1500 today.  Patients wife voiced understanding.

## 2022-03-04 NOTE — Discharge Instructions (Signed)
Leave the dressing in place for 24 hours, after 24 hours you may remove the dressing, after removing the dressing you can shower.

## 2022-03-05 LAB — SURGICAL PATHOLOGY

## 2022-03-11 ENCOUNTER — Inpatient Hospital Stay: Payer: Medicaid Other | Admitting: Internal Medicine

## 2022-03-11 ENCOUNTER — Inpatient Hospital Stay: Payer: Medicaid Other

## 2022-03-11 ENCOUNTER — Telehealth: Payer: Self-pay | Admitting: *Deleted

## 2022-03-11 NOTE — Telephone Encounter (Signed)
Mr. Finch did not come to scheduled appts today for lab and to see Dr. Julien Nordmann.  Contacted patient/wife. Ms. Hopping said appt had slipped her husband's mind and by time he told her, it was too late to come. She said she'd left a message that they were not coming and he needed to reschedule. Dr. Julien Nordmann informed. He requested patient reschedule for first available lab and MD appt. Advised patient/wife that scheduling will contact them. Schedule message sent.

## 2022-03-15 ENCOUNTER — Telehealth: Payer: Self-pay | Admitting: Internal Medicine

## 2022-03-15 NOTE — Telephone Encounter (Signed)
Scheduled per 7/27 in basket, pt wife confirmed appt

## 2022-03-18 ENCOUNTER — Inpatient Hospital Stay: Payer: Medicaid Other | Attending: Medical

## 2022-03-18 ENCOUNTER — Inpatient Hospital Stay (HOSPITAL_BASED_OUTPATIENT_CLINIC_OR_DEPARTMENT_OTHER): Payer: Medicaid Other | Admitting: Internal Medicine

## 2022-03-18 ENCOUNTER — Encounter: Payer: Self-pay | Admitting: Internal Medicine

## 2022-03-18 ENCOUNTER — Other Ambulatory Visit: Payer: Self-pay

## 2022-03-18 VITALS — BP 132/86 | HR 65 | Temp 97.8°F | Resp 15 | Wt 128.8 lb

## 2022-03-18 DIAGNOSIS — R5383 Other fatigue: Secondary | ICD-10-CM | POA: Diagnosis not present

## 2022-03-18 DIAGNOSIS — Z5111 Encounter for antineoplastic chemotherapy: Secondary | ICD-10-CM

## 2022-03-18 DIAGNOSIS — C8118 Nodular sclerosis classical Hodgkin lymphoma, lymph nodes of multiple sites: Secondary | ICD-10-CM | POA: Diagnosis not present

## 2022-03-18 DIAGNOSIS — R634 Abnormal weight loss: Secondary | ICD-10-CM | POA: Insufficient documentation

## 2022-03-18 DIAGNOSIS — Z79899 Other long term (current) drug therapy: Secondary | ICD-10-CM | POA: Insufficient documentation

## 2022-03-18 DIAGNOSIS — R630 Anorexia: Secondary | ICD-10-CM | POA: Insufficient documentation

## 2022-03-18 DIAGNOSIS — C8111 Nodular sclerosis classical Hodgkin lymphoma, lymph nodes of head, face, and neck: Secondary | ICD-10-CM | POA: Diagnosis not present

## 2022-03-18 DIAGNOSIS — R590 Localized enlarged lymph nodes: Secondary | ICD-10-CM | POA: Insufficient documentation

## 2022-03-18 DIAGNOSIS — C8114 Nodular sclerosis classical Hodgkin lymphoma, lymph nodes of axilla and upper limb: Secondary | ICD-10-CM

## 2022-03-18 LAB — CBC WITH DIFFERENTIAL (CANCER CENTER ONLY)
Abs Immature Granulocytes: 0 10*3/uL (ref 0.00–0.07)
Basophils Absolute: 0 10*3/uL (ref 0.0–0.1)
Basophils Relative: 1 %
Eosinophils Absolute: 0 10*3/uL (ref 0.0–0.5)
Eosinophils Relative: 1 %
HCT: 38.4 % — ABNORMAL LOW (ref 39.0–52.0)
Hemoglobin: 13.2 g/dL (ref 13.0–17.0)
Immature Granulocytes: 0 %
Lymphocytes Relative: 29 %
Lymphs Abs: 1.2 10*3/uL (ref 0.7–4.0)
MCH: 31.5 pg (ref 26.0–34.0)
MCHC: 34.4 g/dL (ref 30.0–36.0)
MCV: 91.6 fL (ref 80.0–100.0)
Monocytes Absolute: 0.4 10*3/uL (ref 0.1–1.0)
Monocytes Relative: 10 %
Neutro Abs: 2.6 10*3/uL (ref 1.7–7.7)
Neutrophils Relative %: 59 %
Platelet Count: 276 10*3/uL (ref 150–400)
RBC: 4.19 MIL/uL — ABNORMAL LOW (ref 4.22–5.81)
RDW: 13.1 % (ref 11.5–15.5)
WBC Count: 4.3 10*3/uL (ref 4.0–10.5)
nRBC: 0 % (ref 0.0–0.2)

## 2022-03-18 LAB — CMP (CANCER CENTER ONLY)
ALT: 20 U/L (ref 0–44)
AST: 23 U/L (ref 15–41)
Albumin: 4.3 g/dL (ref 3.5–5.0)
Alkaline Phosphatase: 81 U/L (ref 38–126)
Anion gap: 7 (ref 5–15)
BUN: 20 mg/dL (ref 6–20)
CO2: 26 mmol/L (ref 22–32)
Calcium: 8.9 mg/dL (ref 8.9–10.3)
Chloride: 106 mmol/L (ref 98–111)
Creatinine: 0.73 mg/dL (ref 0.61–1.24)
GFR, Estimated: >60 mL/min (ref >60)
Glucose, Bld: 176 mg/dL — ABNORMAL HIGH (ref 70–99)
Potassium: 3.7 mmol/L (ref 3.5–5.1)
Sodium: 139 mmol/L (ref 135–145)
Total Bilirubin: 0.5 mg/dL (ref 0.3–1.2)
Total Protein: 7.5 g/dL (ref 6.5–8.1)

## 2022-03-18 LAB — LACTATE DEHYDROGENASE: LDH: 163 U/L (ref 98–192)

## 2022-03-18 NOTE — Progress Notes (Signed)
[facility] Telephone:([phone]) [phone]   Fax:([phone]  OFFICE PROGRESS NOTE  Vonna Drafts, FNP [address] [place]  [id number]  DIAGNOSIS: Stage IVB Hodgkin's Lymphoma, nodular sclerosis subtype. He presented with a bulky left cervical and supraclavicular lymphadenopathy as well as involvement of the spleen, nodal disease in the porta hepatis, and extensive disease involving the marrow of the pelvis, spine, and ribs. He was diagnosed in September 2020.    IPS score: 4    PRIOR THERAPY: Systemic chemotherapy with brentuximab vedotin 1.2 mg/kg, doxorubicin 25 mg/m2, vinblastine 6 mg/m2, dacarbazine 375 mg/m2 on days 1 and 15 every 28 days. First dose expected on 05/30/2019.   Status post 6 cycles.    CURRENT THERAPY:  Observation.  INTERVAL HISTORY: Jesse [name] 56 y.o. male returns to the clinic today for follow-up visit accompanied by his wife.  The patient is feeling fine today with no concerning complaints except for the lack of appetite and weight loss.  He denied having any current chest pain, shortness of breath, cough or hemoptysis.  He has no nausea, vomiting, diarrhea or constipation.  He has no headache or visual changes.  He has no palpable lymphadenopathy.  The patient underwent ultrasound-guided core biopsy of the left axillary lymph node by interventional radiology and the final pathology was negative for malignancy.  He is here today for evaluation and discussion of his biopsy results.   MEDICAL HISTORY: Past Medical History:  Diagnosis Date   ETOH abuse    hodgkins lymphoma 2020   Lymphadenopathy    left   Marijuana abuse     ALLERGIES:  has No Known Allergies.  MEDICATIONS:  Current Outpatient Medications  Medication Sig Dispense Refill   gabapentin (NEURONTIN) 100 MG capsule Take 1 capsule (100 mg total) by mouth 3 (three) times daily. 90 capsule 2   lidocaine-prilocaine (EMLA) cream Apply 1 application  topically as needed. Apply to port site  45 minutes prior to port being accessed (Patient not taking: Reported on 02/01/2022) 30 g 1   No current facility-administered medications for this visit.   Facility-Administered Medications Ordered in Other Visits  Medication Dose Route Frequency Provider Last Rate Last Admin   sodium chloride flush (NS) 0.9 % injection 10 mL  10 mL Intracatheter PRN Curt Bears, MD   10 mL at 07/01/21 1551    SURGICAL HISTORY:  Past Surgical History:  Procedure Laterality Date   HERNIA REPAIR     as child   IR IMAGING GUIDED PORT INSERTION  05/29/2019   LYMPH NODE BIOPSY Left 05/07/2019   Procedure: LYMPH NODE BIOPSY;  Surgeon: Melida Quitter, MD;  Location: [facility];  Service: ENT;  Laterality: Left;    REVIEW OF SYSTEMS:  Constitutional: positive for fatigue and weight loss Eyes: negative Ears, nose, mouth, throat, and face: negative Respiratory: negative Cardiovascular: negative Gastrointestinal: negative Genitourinary:negative Integument/breast: negative Hematologic/lymphatic: negative Musculoskeletal:negative Neurological: negative Behavioral/Psych: negative Endocrine: negative Allergic/Immunologic: negative   PHYSICAL EXAMINATION: General appearance: alert, cooperative, fatigued, and no distress Head: Normocephalic, without obvious abnormality, atraumatic Neck: no adenopathy, no JVD, supple, symmetrical, trachea midline, and thyroid not enlarged, symmetric, no tenderness/mass/nodules Lymph nodes: Cervical, supraclavicular, and axillary nodes normal. Resp: clear to auscultation bilaterally Back: symmetric, no curvature. ROM normal. No CVA tenderness. Cardio: regular rate and rhythm, S1, S2 normal, no murmur, click, rub or gallop GI: soft, non-tender; bowel sounds normal; no masses,  no organomegaly Extremities: extremities normal, atraumatic,  no cyanosis or edema Neurologic: Alert and oriented X 3, normal strength and tone.  Normal symmetric reflexes. Normal coordination and gait  ECOG PERFORMANCE STATUS: 1 - Symptomatic but completely ambulatory  Blood pressure 132/86, pulse 65, temperature 97.8 F (36.6 C), temperature source Oral, resp. rate 15, weight 128 lb 12.8 oz (58.4 kg), SpO2 100 %.  LABORATORY DATA: Lab Results  Component Value Date   WBC 4.3 03/18/2022   HGB 13.2 03/18/2022   HCT 38.4 (L) 03/18/2022   MCV 91.6 03/18/2022   PLT 276 03/18/2022      Chemistry      Component Value Date/Time   NA 139 03/18/2022 0912   K 3.7 03/18/2022 0912   CL 106 03/18/2022 0912   CO2 26 03/18/2022 0912   BUN 20 03/18/2022 0912   CREATININE 0.73 03/18/2022 0912      Component Value Date/Time   CALCIUM 8.9 03/18/2022 0912   ALKPHOS 81 03/18/2022 0912   AST 23 03/18/2022 0912   ALT 20 03/18/2022 0912   BILITOT 0.5 03/18/2022 0912       RADIOGRAPHIC STUDIES: [place] CORE BIOPSY (LYMPH NODES)  Result Date: 03/04/2022 INDICATION: History of Hodgkin's lymphoma, borderline left axillary adenopathy with mild PET activity EXAM: ULTRASOUND CORE BIOPSY LEFT AXILLARY ADENOPATHY MEDICATIONS: 1% LIDOCAINE LOCAL ANESTHESIA/SEDATION: Moderate (conscious) sedation was employed during this procedure. A total of Versed 2.0 mg and Fentanyl 100 mcg was administered intravenously by the radiology nurse. Total intra-service moderate Sedation Time: 10 minutes. The patient's level of consciousness and vital signs were monitored continuously by radiology nursing throughout the procedure under my direct supervision. COMPLICATIONS: None immediate. PROCEDURE: Informed written consent was obtained from the patient after a thorough discussion of the procedural risks, benefits and alternatives. All questions were addressed. Maximal Sterile Barrier Technique was utilized including caps, mask, sterile gowns, sterile gloves, sterile drape, hand hygiene and skin antiseptic. A timeout was performed prior to the initiation of the procedure.  Previous imaging reviewed. Preliminary ultrasound performed. Left axillary superficial enlarged lymph node noted with a slightly thickened hypoechoic cortex but preserved fatty hila. This correlates with the CT PET finding. Overlying skin marked. Under sterile conditions and local anesthesia, an 18 gauge core biopsy needle was advanced to the enlarged lymph node. 18 gauge core biopsies obtained. Samples were strandy and fragmented. These were placed in saline. Needle removed. No immediate complication. Patient tolerated the procedure well. IMPRESSION: Successful ultrasound left axillary adenopathy 18 gauge core biopsy Electronically Signed   By: Jerilynn Mages.  Shick M.D.   On: 03/04/2022 13:41     ASSESSMENT AND PLAN: This is a very pleasant 56 years old African-American male recently diagnosed with a stage IV non-Hodgkin lymphoma, nodular sclerosing subtype.  The patient is currently undergoing systemic chemotherapy with brentuximab Vedotin in addition to doxorubicin, vinblastine and dacarbazine.  Status post 6 cycles.  The patient tolerated his treatment fairly well with no concerning adverse effects except for fatigue.   His last PET scan after the treatment showed almost complete resolution of his disease with no hypermetabolic activity in the neck, chest, abdomen pelvis or the osseous structures.  He has Deauville1. The patient has been on observation since that time and he is feeling fine except for increasing fatigue and recent weight loss. He was found on previous CT scan of the neck on January 27, 2022 to have suspicious recurrent lymphadenopathy in the right neck with the largest lymph node measuring 2.4 x 1.4 x 1.5 cm at the right level 2.  There was also enlargement of a level 2 node on the left measuring 1.3 x 1.7 x 2.0 cm again worrisome for recurrent disease. The patient had a PET scan on 02/10/2022 and he is here for evaluation and discussion of his scan results.  His PET scan showed bilateral enlarged  cervical lymph nodes which demonstrate hypermetabolic activity with a reference left level 2 cervical lymph node measuring 1.4 cm in short axis with SUV max of 3.1.  There was also hypermetabolic enlarged left axillary lymph node measuring 1.1 cm with SUV max of 3.8.  There was no hypermetabolic mediastinal or hilar lymph nodes and no suspicious pulmonary nodules.  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 The patient underwent ultrasound-guided core biopsy of the left axillary suspicious lymphadenopathy.  The final pathology was negative for malignancy and showed reactive lymphoid hyperplasia. I discussed the biopsy results with the patient and his wife. I recommended for him to continue on observation with repeat CT scan of the neck, chest, abdomen and pelvis in 3 months. For the weight loss he was encouraged to increase his oral intake and use snacks in between meals. He was advised to call immediately if he has any other concerning symptoms in the interval.  The patient voices understanding of current disease status and treatment options and is in agreement with the current care plan. All questions were answered. The patient knows to call the clinic with any problems, questions or concerns. We can certainly see the patient much sooner if necessary.  Disclaimer: This note was dictated with voice recognition software. Similar sounding words can inadvertently be transcribed and may not be corrected upon review.

## 2022-06-01 ENCOUNTER — Telehealth: Payer: Self-pay | Admitting: Internal Medicine

## 2022-06-01 NOTE — Telephone Encounter (Signed)
Called patient regarding upcoming November appointment, spoke with patient's spouse. Patient will be notified. 

## 2022-06-14 ENCOUNTER — Other Ambulatory Visit (HOSPITAL_COMMUNITY): Payer: Medicaid Other

## 2022-06-18 ENCOUNTER — Inpatient Hospital Stay: Payer: Medicaid Other | Attending: Medical

## 2022-06-18 ENCOUNTER — Other Ambulatory Visit: Payer: Medicaid Other

## 2022-06-18 ENCOUNTER — Ambulatory Visit (HOSPITAL_COMMUNITY): Payer: Medicaid Other

## 2022-06-18 DIAGNOSIS — N400 Enlarged prostate without lower urinary tract symptoms: Secondary | ICD-10-CM | POA: Insufficient documentation

## 2022-06-18 DIAGNOSIS — R252 Cramp and spasm: Secondary | ICD-10-CM | POA: Insufficient documentation

## 2022-06-18 DIAGNOSIS — C8111 Nodular sclerosis classical Hodgkin lymphoma, lymph nodes of head, face, and neck: Secondary | ICD-10-CM | POA: Insufficient documentation

## 2022-06-18 DIAGNOSIS — I700 Atherosclerosis of aorta: Secondary | ICD-10-CM | POA: Insufficient documentation

## 2022-06-18 DIAGNOSIS — H6123 Impacted cerumen, bilateral: Secondary | ICD-10-CM | POA: Insufficient documentation

## 2022-06-18 DIAGNOSIS — Z79899 Other long term (current) drug therapy: Secondary | ICD-10-CM | POA: Insufficient documentation

## 2022-06-18 DIAGNOSIS — M47812 Spondylosis without myelopathy or radiculopathy, cervical region: Secondary | ICD-10-CM | POA: Insufficient documentation

## 2022-06-21 ENCOUNTER — Encounter (HOSPITAL_COMMUNITY): Payer: Self-pay

## 2022-06-21 ENCOUNTER — Inpatient Hospital Stay: Payer: Medicaid Other | Admitting: Internal Medicine

## 2022-06-21 ENCOUNTER — Ambulatory Visit (HOSPITAL_COMMUNITY)
Admission: RE | Admit: 2022-06-21 | Discharge: 2022-06-21 | Disposition: A | Discharge status: Home / Self Care | Payer: Medicaid Other | Source: Ambulatory Visit | Attending: Internal Medicine | Admitting: Internal Medicine

## 2022-06-21 DIAGNOSIS — C8118 Nodular sclerosis classical Hodgkin lymphoma, lymph nodes of multiple sites: Secondary | ICD-10-CM | POA: Insufficient documentation

## 2022-06-21 MED ORDER — IOHEXOL 9 MG/ML PO SOLN
ORAL | Status: AC
  Filled 2022-06-21: qty 1000

## 2022-06-21 MED ORDER — IOHEXOL 9 MG/ML PO SOLN: 500.0000 mL | ORAL | Status: AC

## 2022-06-21 MED ORDER — IOHEXOL 300 MG/ML  SOLN
100.0000 mL | Freq: Once | INTRAMUSCULAR | Status: AC | PRN
  Administered 2022-06-21: 100 mL via INTRAVENOUS

## 2022-06-21 MED ORDER — SODIUM CHLORIDE (PF) 0.9 % IJ SOLN
INTRAMUSCULAR | Status: AC
  Filled 2022-06-21: qty 50

## 2022-06-24 ENCOUNTER — Other Ambulatory Visit: Payer: Self-pay

## 2022-06-24 ENCOUNTER — Inpatient Hospital Stay: Payer: Medicaid Other

## 2022-06-24 ENCOUNTER — Inpatient Hospital Stay (HOSPITAL_BASED_OUTPATIENT_CLINIC_OR_DEPARTMENT_OTHER): Payer: Medicaid Other | Admitting: Internal Medicine

## 2022-06-24 VITALS — BP 133/85 | HR 75 | Temp 98.4°F | Resp 18 | Wt 132.2 lb

## 2022-06-24 DIAGNOSIS — R252 Cramp and spasm: Secondary | ICD-10-CM | POA: Diagnosis not present

## 2022-06-24 DIAGNOSIS — R590 Localized enlarged lymph nodes: Secondary | ICD-10-CM

## 2022-06-24 DIAGNOSIS — I700 Atherosclerosis of aorta: Secondary | ICD-10-CM | POA: Diagnosis not present

## 2022-06-24 DIAGNOSIS — N400 Enlarged prostate without lower urinary tract symptoms: Secondary | ICD-10-CM | POA: Diagnosis not present

## 2022-06-24 DIAGNOSIS — Z79899 Other long term (current) drug therapy: Secondary | ICD-10-CM | POA: Diagnosis not present

## 2022-06-24 DIAGNOSIS — M47812 Spondylosis without myelopathy or radiculopathy, cervical region: Secondary | ICD-10-CM | POA: Diagnosis not present

## 2022-06-24 DIAGNOSIS — H6123 Impacted cerumen, bilateral: Secondary | ICD-10-CM | POA: Diagnosis not present

## 2022-06-24 DIAGNOSIS — C8111 Nodular sclerosis classical Hodgkin lymphoma, lymph nodes of head, face, and neck: Secondary | ICD-10-CM | POA: Diagnosis present

## 2022-06-24 LAB — CBC WITH DIFFERENTIAL (CANCER CENTER ONLY)
Abs Immature Granulocytes: 0.01 10*3/uL (ref 0.00–0.07)
Basophils Absolute: 0 10*3/uL (ref 0.0–0.1)
Basophils Relative: 1 %
Eosinophils Absolute: 0 10*3/uL (ref 0.0–0.5)
Eosinophils Relative: 1 %
HCT: 42.9 % (ref 39.0–52.0)
Hemoglobin: 14.6 g/dL (ref 13.0–17.0)
Immature Granulocytes: 0 %
Lymphocytes Relative: 16 %
Lymphs Abs: 1 10*3/uL (ref 0.7–4.0)
MCH: 31 pg (ref 26.0–34.0)
MCHC: 34 g/dL (ref 30.0–36.0)
MCV: 91.1 fL (ref 80.0–100.0)
Monocytes Absolute: 0.6 10*3/uL (ref 0.1–1.0)
Monocytes Relative: 9 %
Neutro Abs: 4.7 10*3/uL (ref 1.7–7.7)
Neutrophils Relative %: 73 %
Platelet Count: 228 10*3/uL (ref 150–400)
RBC: 4.71 MIL/uL (ref 4.22–5.81)
RDW: 12.3 % (ref 11.5–15.5)
WBC Count: 6.4 10*3/uL (ref 4.0–10.5)
nRBC: 0 % (ref 0.0–0.2)

## 2022-06-24 LAB — CMP (CANCER CENTER ONLY)
ALT: 21 U/L (ref 0–44)
AST: 20 U/L (ref 15–41)
Albumin: 4.3 g/dL (ref 3.5–5.0)
Alkaline Phosphatase: 82 U/L (ref 38–126)
Anion gap: 5 (ref 5–15)
BUN: 16 mg/dL (ref 6–20)
CO2: 29 mmol/L (ref 22–32)
Calcium: 9.3 mg/dL (ref 8.9–10.3)
Chloride: 103 mmol/L (ref 98–111)
Creatinine: 0.86 mg/dL (ref 0.61–1.24)
GFR, Estimated: >60 mL/min (ref >60)
Glucose, Bld: 81 mg/dL (ref 70–99)
Potassium: 4.2 mmol/L (ref 3.5–5.1)
Sodium: 137 mmol/L (ref 135–145)
Total Bilirubin: 0.6 mg/dL (ref 0.3–1.2)
Total Protein: 7.5 g/dL (ref 6.5–8.1)

## 2022-06-24 LAB — LACTATE DEHYDROGENASE: LDH: 145 U/L (ref 98–192)

## 2022-06-24 NOTE — Progress Notes (Signed)
[facility] [phone]   [phone]  OFFICE PROGRESS NOTE  Vonna Drafts, FNP [address] [place] [address]  DIAGNOSIS: Stage IVB Hodgkin's Lymphoma, nodular sclerosis subtype. He presented with a bulky left cervical and supraclavicular lymphadenopathy as well as involvement of the spleen, nodal disease in the porta hepatis, and extensive disease involving the marrow of the pelvis, spine, and ribs. He was diagnosed in September 2020.    IPS score: 4    PRIOR THERAPY: Systemic chemotherapy with brentuximab vedotin 1.2 mg/kg, doxorubicin 25 mg/m2, vinblastine 6 mg/m2, dacarbazine 375 mg/m2 on days 1 and 15 every 28 days. First dose expected on 05/30/2019.   Status post 6 cycles.    CURRENT THERAPY:  Observation.  INTERVAL HISTORY: Jesse [name] 56 y.o. male returns to the clinic today for follow-up visit.  The patient is feeling fine today with no concerning complaints except for intermittent cramps in his legs.  He denied having any current chest pain, shortness of breath, cough or hemoptysis.  He has no palpable lymphadenopathy.  He has no nausea, vomiting, diarrhea or constipation.  He has no headache or visual changes.  He had repeat CT scan of the neck, chest, abdomen and pelvis performed recently and he is here for evaluation and discussion of his scan results.   MEDICAL HISTORY: Past Medical History:  Diagnosis Date   ETOH abuse    hodgkins lymphoma 2020   Lymphadenopathy    left   Marijuana abuse     ALLERGIES:  has No Known Allergies.  MEDICATIONS:  Current Outpatient Medications  Medication Sig Dispense Refill   gabapentin (NEURONTIN) 100 MG capsule Take 1 capsule (100 mg total) by mouth 3 (three) times daily. 90 capsule 2   lidocaine-prilocaine (EMLA) cream Apply 1 application topically as needed. Apply to port site  45 minutes prior to port being accessed 30 g 1   No current  facility-administered medications for this visit.   Facility-Administered Medications Ordered in Other Visits  Medication Dose Route Frequency Provider Last Rate Last Admin   sodium chloride flush (NS) 0.9 % injection 10 mL  10 mL Intracatheter PRN Curt Bears, MD   10 mL at 07/01/21 1551    SURGICAL HISTORY:  Past Surgical History:  Procedure Laterality Date   HERNIA REPAIR     as child   IR IMAGING GUIDED PORT INSERTION  05/29/2019   LYMPH NODE BIOPSY Left 05/07/2019   Procedure: LYMPH NODE BIOPSY;  Surgeon: Melida Quitter, MD;  Location: [facility];  Service: ENT;  Laterality: Left;    REVIEW OF SYSTEMS:  A comprehensive review of systems was negative.   PHYSICAL EXAMINATION: General appearance: alert, cooperative, and no distress Head: Normocephalic, without obvious abnormality, atraumatic Neck: no adenopathy, no JVD, supple, symmetrical, trachea midline, and thyroid not enlarged, symmetric, no tenderness/mass/nodules Lymph nodes: Cervical, supraclavicular, and axillary nodes normal. Resp: clear to auscultation bilaterally Back: symmetric, no curvature. ROM normal. No CVA tenderness. Cardio: regular rate and rhythm, S1, S2 normal, no murmur, click, rub or gallop GI: soft, non-tender; bowel sounds normal; no masses,  no organomegaly Extremities: extremities normal, atraumatic, no cyanosis or edema  ECOG PERFORMANCE STATUS: 1 - Symptomatic but completely ambulatory  Blood pressure 133/85, pulse 75, temperature 98.4 F (36.9 C), temperature source Oral, resp. rate 18, weight 132 lb 4 oz (60 kg), SpO2 99 %.  LABORATORY DATA: Lab Results  Component Value Date  WBC 4.3 03/18/2022   HGB 13.2 03/18/2022   HCT 38.4 (L) 03/18/2022   MCV 91.6 03/18/2022   PLT 276 03/18/2022      Chemistry      Component Value Date/Time   NA 139 03/18/2022 0912   K 3.7 03/18/2022 0912   CL 106 03/18/2022 0912   CO2 26 03/18/2022 0912   BUN 20 03/18/2022 0912   CREATININE  0.73 03/18/2022 0912      Component Value Date/Time   CALCIUM 8.9 03/18/2022 0912   ALKPHOS 81 03/18/2022 0912   AST 23 03/18/2022 0912   ALT 20 03/18/2022 0912   BILITOT 0.5 03/18/2022 0912       RADIOGRAPHIC STUDIES: CT Chest W Contrast  Result Date: 06/23/2022 CLINICAL DATA:  Hodgkin lymphoma.  * Tracking Code: BO * EXAM: CT CHEST, ABDOMEN, AND PELVIS WITH CONTRAST TECHNIQUE: Multidetector CT imaging of the chest, abdomen and pelvis was performed following the standard protocol during bolus administration of intravenous contrast. RADIATION DOSE REDUCTION: This exam was performed according to the departmental dose-optimization program which includes automated exposure control, adjustment of the mA and/or kV according to patient size and/or use of iterative reconstruction technique. CONTRAST:  111m OMNIPAQUE IOHEXOL 300 MG/ML  SOLN COMPARISON:  PET 02/10/2022 and CT chest abdomen pelvis 01/27/2022. FINDINGS: CT CHEST FINDINGS Cardiovascular: Right IJ Port-A-Cath terminates in the low SVC. Coronary artery calcification. Heart is at the upper limits of normal in size. No pericardial effusion. Mediastinum/Nodes: Right supraclavicular/right low internal jugular lymph nodes measure up to 10 mm (2/6), similar. No pathologically enlarged mediastinal, hilar or axillary lymph nodes. Largest left axillary lymph node measures 8 mm and has a fatty hilum. Esophagus is grossly unremarkable. Lungs/Pleura: No suspicious pulmonary nodules. No pleural fluid. Airway is unremarkable. Musculoskeletal: Degenerative changes in the spine. No worrisome lytic or sclerotic lesions. CT ABDOMEN PELVIS FINDINGS Hepatobiliary: Well-circumscribed low-attenuation lesions in the right hepatic lobe measure up to 1.5 cm, indicative of cysts. Larger low-attenuation lesion in the posterior right hepatic lobe has peripheral contrast puddling, measures 3.2 x 4.3 cm (2/53) and centripetal filling in on delayed imaging, indicative of a  hemangioma. Similar smaller lesion in the left hepatic lobe (2/46). Blush of hyperattenuation in the inferior right hepatic lobe (2/67), likely a flash fill hemangioma. Liver and gallbladder are otherwise unremarkable. No biliary ductal dilatation. Pancreas: Negative. Spleen: Negative. Adrenals/Urinary Tract: Adrenal glands and kidneys are unremarkable. Ureters are decompressed. Bladder is slightly thick-walled. Stomach/Bowel: Stomach, small bowel, appendix and colon are unremarkable. There is rectal wall thickening. Vascular/Lymphatic: Atherosclerotic calcification of the aorta. Circumaortic left renal vein. No pathologically enlarged lymph nodes. Reproductive: Prostate is enlarged. Other: No free fluid.  Mesenteries and peritoneum are unremarkable. Musculoskeletal: Hazy fat density lesions in the gluteus musculature, as before. No worrisome lytic or sclerotic lesions. Degenerative changes in the spine. IMPRESSION: 1. Right supraclavicular adenopathy, stable. Left axillary lymph nodes are not enlarged by CT size criteria. No new adenopathy. CT neck dictated separately. 2. Rectal wall thickening. No abnormal hypermetabolism on 02/10/2022. Please correlate clinically. 3. Enlarged prostate. 4. Hazy fat density lesions in the gluteus musculature, as before, mildly hypermetabolic on recent PET. Consider further evaluation with MR without and with contrast, as clinically indicated. 5. Aortic atherosclerosis (ICD10-I70.0). Coronary artery calcification. Electronically Signed   By: MLorin PicketM.D.   On: 06/23/2022 16:12   CT Abdomen Pelvis W Contrast  Result Date: 06/23/2022 CLINICAL DATA:  Hodgkin lymphoma.  * Tracking Code: BO * EXAM: CT CHEST, ABDOMEN,  AND PELVIS WITH CONTRAST TECHNIQUE: Multidetector CT imaging of the chest, abdomen and pelvis was performed following the standard protocol during bolus administration of intravenous contrast. RADIATION DOSE REDUCTION: This exam was performed according to the  departmental dose-optimization program which includes automated exposure control, adjustment of the mA and/or kV according to patient size and/or use of iterative reconstruction technique. CONTRAST:  139m OMNIPAQUE IOHEXOL 300 MG/ML  SOLN COMPARISON:  PET 02/10/2022 and CT chest abdomen pelvis 01/27/2022. FINDINGS: CT CHEST FINDINGS Cardiovascular: Right IJ Port-A-Cath terminates in the low SVC. Coronary artery calcification. Heart is at the upper limits of normal in size. No pericardial effusion. Mediastinum/Nodes: Right supraclavicular/right low internal jugular lymph nodes measure up to 10 mm (2/6), similar. No pathologically enlarged mediastinal, hilar or axillary lymph nodes. Largest left axillary lymph node measures 8 mm and has a fatty hilum. Esophagus is grossly unremarkable. Lungs/Pleura: No suspicious pulmonary nodules. No pleural fluid. Airway is unremarkable. Musculoskeletal: Degenerative changes in the spine. No worrisome lytic or sclerotic lesions. CT ABDOMEN PELVIS FINDINGS Hepatobiliary: Well-circumscribed low-attenuation lesions in the right hepatic lobe measure up to 1.5 cm, indicative of cysts. Larger low-attenuation lesion in the posterior right hepatic lobe has peripheral contrast puddling, measures 3.2 x 4.3 cm (2/53) and centripetal filling in on delayed imaging, indicative of a hemangioma. Similar smaller lesion in the left hepatic lobe (2/46). Blush of hyperattenuation in the inferior right hepatic lobe (2/67), likely a flash fill hemangioma. Liver and gallbladder are otherwise unremarkable. No biliary ductal dilatation. Pancreas: Negative. Spleen: Negative. Adrenals/Urinary Tract: Adrenal glands and kidneys are unremarkable. Ureters are decompressed. Bladder is slightly thick-walled. Stomach/Bowel: Stomach, small bowel, appendix and colon are unremarkable. There is rectal wall thickening. Vascular/Lymphatic: Atherosclerotic calcification of the aorta. Circumaortic left renal vein. No  pathologically enlarged lymph nodes. Reproductive: Prostate is enlarged. Other: No free fluid.  Mesenteries and peritoneum are unremarkable. Musculoskeletal: Hazy fat density lesions in the gluteus musculature, as before. No worrisome lytic or sclerotic lesions. Degenerative changes in the spine. IMPRESSION: 1. Right supraclavicular adenopathy, stable. Left axillary lymph nodes are not enlarged by CT size criteria. No new adenopathy. CT neck dictated separately. 2. Rectal wall thickening. No abnormal hypermetabolism on 02/10/2022. Please correlate clinically. 3. Enlarged prostate. 4. Hazy fat density lesions in the gluteus musculature, as before, mildly hypermetabolic on recent PET. Consider further evaluation with MR without and with contrast, as clinically indicated. 5. Aortic atherosclerosis (ICD10-I70.0). Coronary artery calcification. Electronically Signed   By: MLorin PicketM.D.   On: 06/23/2022 16:12   CT Soft Tissue Neck W Contrast  Result Date: 06/23/2022 CLINICAL DATA:  Hodgkin's lymphoma. EXAM: CT NECK WITH CONTRAST TECHNIQUE: Multidetector CT imaging of the neck was performed using the standard protocol following the bolus administration of intravenous contrast. RADIATION DOSE REDUCTION: This exam was performed according to the departmental dose-optimization program which includes automated exposure control, adjustment of the mA and/or kV according to patient size and/or use of iterative reconstruction technique. CONTRAST:  1063mOMNIPAQUE IOHEXOL 300 MG/ML  SOLN COMPARISON:  CT neck 01/27/2022 FINDINGS: Pharynx and larynx: The nasal cavity and nasopharynx are unremarkable. The oral cavity and oropharynx are unremarkable. The hypopharynx and larynx are unremarkable. The vocal folds are grossly normal in appearance. There is no retropharyngeal fluid collection. The airway is patent. Salivary glands: The parotid and submandibular glands are unremarkable. Thyroid: Unremarkable. Lymph nodes:  Previously seen lymphadenopathy on the study from 01/27/2022 has improved. The 0.7 cm right level IIA lymph node is decreased in size from  proximally 1.3 cm (2-36). A 0.8 cm left level IIA lymph node is decreased from approximately 1.1 cm (2-30). A left level IIB/III lymph node measuring 0.7 cm is similar in size but previously seen internal cystic change/necrosis is decreased in conspicuity. Scattered subcentimeter posterior triangle lymph nodes overall decreased in size. There is no new or progressive lymphadenopathy. Vascular: The major vasculature of the neck is unremarkable. Limited intracranial: The imaged portions of the intracranial compartment are grossly unremarkable. Visualized orbits: Only a small portion of the inferior orbits is included within the field of view, unremarkable. Mastoids and visualized paranasal sinuses: There is trace mucosal thickening along the floor of the left maxillary sinus. The mastoid air cells and middle ear cavities are clear. Cerumen is noted in both external auditory canals. Skeleton: Multilevel degenerative changes are again seen in the cervical spine. There is no acute osseous abnormality or suspicious osseous lesion. Upper chest: Assessed on the separately dictated CT chest. Other: None. IMPRESSION: Decreased size of bilateral cervical chain lymph nodes with no new or progressive lymphadenopathy. Findings consistent with treatment response. Electronically Signed   By: Valetta Mole M.D.   On: 06/23/2022 13:57     ASSESSMENT AND PLAN: This is a very pleasant 56 years old African-American male recently diagnosed with a stage IV non-Hodgkin lymphoma, nodular sclerosing subtype.  The patient is currently undergoing systemic chemotherapy with brentuximab Vedotin in addition to doxorubicin, vinblastine and dacarbazine.  Status post 6 cycles.  The patient tolerated his treatment fairly well with no concerning adverse effects except for fatigue.   His last PET scan after the  treatment showed almost complete resolution of his disease with no hypermetabolic activity in the neck, chest, abdomen pelvis or the osseous structures.  He has Deauville1. The patient has been on observation since that time and he is feeling fine except for increasing fatigue and recent weight loss. He was found on previous CT scan of the neck on January 27, 2022 to have suspicious recurrent lymphadenopathy in the right neck with the largest lymph node measuring 2.4 x 1.4 x 1.5 cm at the right level 2.  There was also enlargement of a level 2 node on the left measuring 1.3 x 1.7 x 2.0 cm again worrisome for recurrent disease. The patient had a PET scan on 02/10/2022 and he is here for evaluation and discussion of his scan results.  His PET scan showed bilateral enlarged cervical lymph nodes which demonstrate hypermetabolic activity with a reference left level 2 cervical lymph node measuring 1.4 cm in short axis with SUV max of 3.1.  There was also hypermetabolic enlarged left axillary lymph node measuring 1.1 cm with SUV max of 3.8.  There was no hypermetabolic mediastinal or hilar lymph nodes and no suspicious pulmonary nodules.  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 The patient underwent ultrasound-guided core biopsy of the left axillary suspicious lymphadenopathy.  The final pathology was negative for malignancy and showed reactive lymphoid hyperplasia. The patient is currently on observation and he is feeling fine. He had repeat CT scan of the neck, chest, abdomen and pelvis performed recently.  I personally and independently reviewed the scans and discussed the result with the patient today. His scan showed no concerning findings for disease recurrence or metastasis. I recommended for him to continue on observation with repeat blood  work in 6 months. He was advised to call immediately if he has any other concerning symptoms in the interval.  The patient voices understanding of current disease status and treatment options and is in agreement with the current care plan. All questions were answered. The patient knows to call the clinic with any problems, questions or concerns. We can certainly see the patient much sooner if necessary.  Disclaimer: This note was dictated with voice recognition software. Similar sounding words can inadvertently be transcribed and may not be corrected upon review.

## 2022-08-11 ENCOUNTER — Other Ambulatory Visit: Payer: Self-pay

## 2022-08-11 ENCOUNTER — Telehealth: Payer: Self-pay

## 2022-08-11 DIAGNOSIS — C8118 Nodular sclerosis classical Hodgkin lymphoma, lymph nodes of multiple sites: Secondary | ICD-10-CM

## 2022-08-11 MED ORDER — GABAPENTIN 100 MG PO CAPS: 100.0000 mg | ORAL_CAPSULE | Freq: Three times a day (TID) | ORAL | 2 refills | Status: DC

## 2022-08-11 NOTE — Telephone Encounter (Signed)
This nurse spoke with patient who stated that he feels like he needs his port flushed.  This nurse scheduled patient for port flush only for 12/28.  Patient also states that he needs a refill on his Gabapentin states that the burning in his feet has increased.  This nurse advised that refill request will be sent to the provider for approval.  No further questions or concerns at this time.

## 2022-08-12 ENCOUNTER — Inpatient Hospital Stay: Payer: Medicaid Other | Attending: Medical

## 2022-08-12 DIAGNOSIS — Z452 Encounter for adjustment and management of vascular access device: Secondary | ICD-10-CM | POA: Diagnosis present

## 2022-08-12 DIAGNOSIS — Z95828 Presence of other vascular implants and grafts: Secondary | ICD-10-CM

## 2022-08-12 DIAGNOSIS — C8111 Nodular sclerosis classical Hodgkin lymphoma, lymph nodes of head, face, and neck: Secondary | ICD-10-CM | POA: Diagnosis present

## 2022-08-12 MED ORDER — HEPARIN SOD (PORK) LOCK FLUSH 100 UNIT/ML IV SOLN
500.0000 [IU] | Freq: Once | INTRAVENOUS | Status: AC | PRN
  Administered 2022-08-12: 500 [IU]

## 2022-08-12 MED ORDER — SODIUM CHLORIDE 0.9% FLUSH
10.0000 mL | INTRAVENOUS | Status: DC | PRN
  Administered 2022-08-12: 10 mL

## 2022-12-20 ENCOUNTER — Telehealth: Payer: Self-pay | Admitting: Internal Medicine

## 2022-12-20 NOTE — Telephone Encounter (Signed)
Rescheduled 05/23 appointment due to provider pal, left a voicemail regarding new rescheduled appointment.

## 2022-12-23 ENCOUNTER — Other Ambulatory Visit: Payer: Medicaid Other

## 2022-12-23 ENCOUNTER — Ambulatory Visit: Payer: Medicaid Other | Admitting: Internal Medicine

## 2023-01-06 ENCOUNTER — Ambulatory Visit: Payer: Medicaid Other | Admitting: Internal Medicine

## 2023-01-06 ENCOUNTER — Other Ambulatory Visit: Payer: Medicaid Other

## 2023-01-12 ENCOUNTER — Inpatient Hospital Stay: Payer: Medicaid Other | Admitting: Internal Medicine

## 2023-01-12 ENCOUNTER — Inpatient Hospital Stay: Payer: Medicaid Other

## 2023-01-17 NOTE — Progress Notes (Deleted)
[facility] OFFICE PROGRESS NOTE  Jesse Providence, FNP [address] [place] [address]  DIAGNOSIS: Stage IVB Hodgkin's Lymphoma, nodular sclerosis subtype. He presented with a bulky left cervical and supraclavicular lymphadenopathy as well as involvement of the spleen, nodal disease in the porta hepatis, and extensive disease involving the marrow of the pelvis, spine, and ribs. He was diagnosed in September 2020.    IPS score: 4   PRIOR THERAPY: Systemic chemotherapy with brentuximab vedotin 1.2 mg/kg, doxorubicin 25 mg/m2, vinblastine 6 mg/m2, dacarbazine 375 mg/m2 on days 1 and 15 every 28 days. First dose expected on 05/30/2019.   Status post 6 cycles.    CURRENT THERAPY: Observation   INTERVAL HISTORY: Jesse [name] 57 y.o. male returns  returns to the clinic today for a 35-month follow-up visit.  The patient was last seen in the clinic on 06/24/2022. The patient has been on observation since completing treatment with chemotherapy in March 2021.   Overall, he is feeling well today. He denies any new concerning adenopathy except  ***. He denies any fever, chills, or night sweats.  He lost 4 lbs since last being seen as he does not eat a lot of foods with good nutritional content with his alcohol consumption. The patient  He denies any recent or recurrent signs of infection including nasal congestion, cough, shortness of breath, skin infections, or dysuria.  Denies any early satiety or abdominal fullness.  Denies any abnormal bleeding or bruising.  He misses his Port-A-Cath flush appointments periodically. This was last flushed on ***. He also has peripheral neuropathy. He stopped taking his gabapentin. Of note, the patient has heavy alcohol use. He drinks about 12 small cans of beer daily.  He is here today for evaluation and repeat blood work.     MEDICAL HISTORY: Past Medical History:  Diagnosis Date   ETOH abuse    hodgkins lymphoma 2020    Lymphadenopathy    left   Marijuana abuse     ALLERGIES:  has No Known Allergies.  MEDICATIONS:  Current Outpatient Medications  Medication Sig Dispense Refill   gabapentin (NEURONTIN) 100 MG capsule Take 1 capsule (100 mg total) by mouth 3 (three) times daily. 90 capsule 2   lidocaine-prilocaine (EMLA) cream Apply 1 application topically as needed. Apply to port site  45 minutes prior to port being accessed 30 g 1   No current facility-administered medications for this visit.   Facility-Administered Medications Ordered in Other Visits  Medication Dose Route Frequency Provider Last Rate Last Admin   sodium chloride flush (NS) 0.9 % injection 10 mL  10 mL Intracatheter PRN Si Gaul, MD   10 mL at 07/01/21 1551    SURGICAL HISTORY:  Past Surgical History:  Procedure Laterality Date   HERNIA REPAIR     as child   IR IMAGING GUIDED PORT INSERTION  05/29/2019   LYMPH NODE BIOPSY Left 05/07/2019   Procedure: LYMPH NODE BIOPSY;  Surgeon: Christia Reading, MD;  Location: [facility];  Service: ENT;  Laterality: Left;    REVIEW OF SYSTEMS:   Review of Systems  Constitutional: Negative for appetite change, chills, fatigue, fever and unexpected weight change.  HENT:   Negative for mouth sores, nosebleeds, sore throat and trouble swallowing.   Eyes: Negative for eye problems and icterus.  Respiratory: Negative for cough, hemoptysis, shortness of breath and wheezing.   Cardiovascular: Negative for chest pain and leg swelling.  Gastrointestinal: Negative for  abdominal pain, constipation, diarrhea, nausea and vomiting.  Genitourinary: Negative for bladder incontinence, difficulty urinating, dysuria, frequency and hematuria.   Musculoskeletal: Negative for back pain, gait problem, neck pain and neck stiffness.  Skin: Negative for itching and rash.  Neurological: Negative for dizziness, extremity weakness, gait problem, headaches, light-headedness and seizures.   Hematological: Negative for adenopathy. Does not bruise/bleed easily.  Psychiatric/Behavioral: Negative for confusion, depression and sleep disturbance. The patient is not nervous/anxious.     PHYSICAL EXAMINATION:  There were no vitals taken for this visit.  ECOG PERFORMANCE STATUS: {CHL ONC ECOG [id number]  Physical Exam  Constitutional: Oriented to person, place, and time and well-developed, well-nourished, and in no distress. No distress.  HENT:  Head: Normocephalic and atraumatic.  Mouth/Throat: Oropharynx is clear and moist. No oropharyngeal exudate.  Eyes: Conjunctivae are normal. Right eye exhibits no discharge. Left eye exhibits no discharge. No scleral icterus.  Neck: Normal range of motion. Neck supple.  Cardiovascular: Normal rate, regular rhythm, normal heart sounds and intact distal pulses.   Pulmonary/Chest: Effort normal and breath sounds normal. No respiratory distress. No wheezes. No rales.  Abdominal: Soft. Bowel sounds are normal. Exhibits no distension and no mass. There is no tenderness.  Musculoskeletal: Normal range of motion. Exhibits no edema.  Lymphadenopathy:    No cervical adenopathy.  Neurological: Alert and oriented to person, place, and time. Exhibits normal muscle tone. Gait normal. Coordination normal.  Skin: Skin is warm and dry. No rash noted. Not diaphoretic. No erythema. No pallor.  Psychiatric: Mood, memory and judgment normal.  Vitals reviewed.  LABORATORY DATA: Lab Results  Component Value Date   WBC 6.4 06/24/2022   HGB 14.6 06/24/2022   HCT 42.9 06/24/2022   MCV 91.1 06/24/2022   PLT 228 06/24/2022      Chemistry      Component Value Date/Time   NA 137 06/24/2022 1019   K 4.2 06/24/2022 1019   CL 103 06/24/2022 1019   CO2 29 06/24/2022 1019   BUN 16 06/24/2022 1019   CREATININE 0.86 06/24/2022 1019      Component Value Date/Time   CALCIUM 9.3 06/24/2022 1019   ALKPHOS 82 06/24/2022 1019   AST 20 06/24/2022 1019   ALT  21 06/24/2022 1019   BILITOT 0.6 06/24/2022 1019       RADIOGRAPHIC STUDIES:  No results found.   ASSESSMENT/PLAN:  his is a very pleasant 57 years old African-American male diagnosed with a stage IV Hodgkin lymphoma, nodular sclerosing subtype. He presented with a bulky left cervical and supraclavicular lymphadenopathy as well as involvement of the spleen, nodal disease in the porta hepatis, and extensive disease involving the marrow of the pelvis, spine, and ribs. He was diagnosed in September 2020.   He underwent systemic chemotherapy with brentuximab Vedotin in addition to doxorubicin, vinblastine and dacarbazine.  Status post 6 cycles. His last dose was in October 2021.    His last PET scan after the treatment showed almost complete resolution of his disease with no hypermetabolic activity in the neck, chest, abdomen pelvis or the osseous structures.  He has Deauville1.   The patient is currently on observation and he is feeling fine today with no concerning complaints.  He is here today for evaluation and repeat blood work.   The patient was seen by Dr. Arbutus Ped today.  Labs were reviewed.  Recommend continue on observation with a repeat CT scan in 6 months.  His CT scan will be a CT of the neck,  chest, abdomen, pelvis.  Plan for peripheral neuropathy and alcohol abuse?  Flush?  The patient was advised to call immediately if he has any concerning symptoms in the interval. The patient voices understanding of current disease status and treatment options and is in agreement with the current care plan. All questions were answered. The patient knows to call the clinic with any problems, questions or concerns. We can certainly see the patient much sooner if necessary     No orders of the defined types were placed in this encounter.    I spent {CHL ONC TIME VISIT - ZOXWR:[id number]} counseling the patient face to face. The total time spent in the appointment was {CHL ONC TIME  VISIT - BJYNW:[id number]}.  Advay Volante L Pauline Trainer, PA-C 01/17/23

## 2023-01-20 ENCOUNTER — Inpatient Hospital Stay: Payer: Medicaid Other

## 2023-01-20 ENCOUNTER — Inpatient Hospital Stay: Payer: Medicaid Other | Admitting: Physician Assistant

## 2023-01-21 NOTE — Progress Notes (Unsigned)
[facility] OFFICE PROGRESS NOTE  [name], FNP [address] [place] [address]  DIAGNOSIS:  Stage IVB Hodgkin's Lymphoma, nodular sclerosis subtype. He presented with a bulky left cervical and supraclavicular lymphadenopathy as well as involvement of the spleen, nodal disease in the porta hepatis, and extensive disease involving the marrow of the pelvis, spine, and ribs. He was diagnosed in September 2020.    IPS score: 4   PRIOR THERAPY: Systemic chemotherapy with brentuximab vedotin 1.2 mg/kg, doxorubicin 25 mg/m2, vinblastine 6 mg/m2, dacarbazine 375 mg/m2 on days 1 and 15 every 28 days. First dose expected on 05/30/2019.   Status post 6 cycles.    CURRENT THERAPY: Observation  INTERVAL HISTORY: Jesse [name] 57 y.o. male returns to the clinic today for a 40-month follow-up visit.  The patient was last seen in the clinic on 06/24/2022. The patient has been on observation since completing treatment with chemotherapy in March 2021.   Overall, he is feeling well today. He denies any new concerning adenopathy.  He denies any fever, chills, or night sweats. His appetite is still not good. He reports he drinks too much beer. He drinks a six pack on average daily.  He denies any recent or recurrent signs of infection including nasal congestion, cough, shortness of breath, skin infections, or dysuria. Denies any early satiety or abdominal fullness.  Denies any abnormal bleeding or bruising.  He misses his Port-A-Cath flush appointments periodically. This was last flushed today. He also has peripheral neuropathy. He stopped taking his gabapentin and needs a refill. Of note, the patient has heavy alcohol use.  He is here today for evaluation and repeat blood work.    MEDICAL HISTORY: Past Medical History:  Diagnosis Date   ETOH abuse    hodgkins lymphoma 2020   Lymphadenopathy    left   Marijuana abuse     ALLERGIES:  has No Known  Allergies.  MEDICATIONS:  Current Outpatient Medications  Medication Sig Dispense Refill   gabapentin (NEURONTIN) 100 MG capsule Take 1 capsule (100 mg total) by mouth 3 (three) times daily. 90 capsule 2   lidocaine-prilocaine (EMLA) cream Apply 1 application topically as needed. Apply to port site  45 minutes prior to port being accessed 30 g 1   No current facility-administered medications for this visit.   Facility-Administered Medications Ordered in Other Visits  Medication Dose Route Frequency Provider Last Rate Last Admin   sodium chloride flush (NS) 0.9 % injection 10 mL  10 mL Intracatheter PRN Si Gaul, MD   10 mL at 07/01/21 1551    SURGICAL HISTORY:  Past Surgical History:  Procedure Laterality Date   HERNIA REPAIR     as child   IR IMAGING GUIDED PORT INSERTION  05/29/2019   LYMPH NODE BIOPSY Left 05/07/2019   Procedure: LYMPH NODE BIOPSY;  Surgeon: Christia Reading, MD;  Location: [facility];  Service: ENT;  Laterality: Left;    REVIEW OF SYSTEMS:   Review of Systems  Constitutional: Positive for poor appetite. Negative for appetite change, chills, fatigue, and fever.  HENT: Negative for mouth sores, nosebleeds, sore throat and trouble swallowing.   Eyes: Negative for eye problems and icterus.  Respiratory: Negative for cough, hemoptysis, shortness of breath and wheezing.   Cardiovascular: Negative for chest pain and leg swelling.  Gastrointestinal: Negative for abdominal pain, constipation, diarrhea, nausea and vomiting.  Genitourinary: Negative for bladder incontinence, difficulty urinating, dysuria, frequency and hematuria.  Musculoskeletal: Negative for back pain, gait problem, neck pain and neck stiffness.  Skin: Negative for itching and rash.  Neurological: Negative for dizziness, extremity weakness, gait problem, headaches, light-headedness and seizures.  Hematological: Negative for adenopathy. Does not bruise/bleed easily.   Psychiatric/Behavioral: Negative for confusion, depression and sleep disturbance. The patient is not nervous/anxious.     PHYSICAL EXAMINATION:  Blood pressure (!) 156/96, pulse (!) 56, temperature (!) 97.4 F (36.3 C), temperature source Oral, resp. rate 17, weight 132 lb 9 oz (60.1 kg), SpO2 100 %.  ECOG PERFORMANCE STATUS: 0  Physical Exam  Constitutional: Oriented to person, place, and time and thin appearing male, and in no distress.  HENT:  Head: Normocephalic and atraumatic.  Mouth/Throat: Oropharynx is clear and moist. No oropharyngeal exudate.  Eyes: Conjunctivae are normal. Right eye exhibits no discharge. Left eye exhibits no discharge. No scleral icterus.  Neck: Normal range of motion. Neck supple.  Cardiovascular: Normal rate, regular rhythm, normal heart sounds and intact distal pulses.   Pulmonary/Chest: Effort normal and breath sounds normal. No respiratory distress. No wheezes. No rales.  Abdominal: Soft. Bowel sounds are normal. Exhibits no distension and no mass. There is no tenderness.  Musculoskeletal: Normal range of motion. Exhibits no edema.  Lymphadenopathy:    No cervical adenopathy.  Neurological: Alert and oriented to person, place, and time. Exhibits normal muscle tone. Gait normal. Coordination normal.  Skin: Skin is warm and dry. No rash noted. Not diaphoretic. No erythema. No pallor.  Psychiatric: Mood, memory and judgment normal.  Vitals reviewed.  LABORATORY DATA: Lab Results  Component Value Date   WBC 3.6 (L) 01/26/2023   HGB 14.3 01/26/2023   HCT 40.7 01/26/2023   MCV 89.8 01/26/2023   PLT 310 01/26/2023      Chemistry      Component Value Date/Time   NA 139 01/26/2023 0938   K 3.9 01/26/2023 0938   CL 105 01/26/2023 0938   CO2 29 01/26/2023 0938   BUN 17 01/26/2023 0938   CREATININE 0.62 01/26/2023 0938      Component Value Date/Time   CALCIUM 9.3 01/26/2023 0938   ALKPHOS 73 01/26/2023 0938   AST 20 01/26/2023 0938   ALT 17  01/26/2023 0938   BILITOT 0.6 01/26/2023 0938       RADIOGRAPHIC STUDIES:  No results found.   ASSESSMENT/PLAN:  his is a very pleasant 57 years old African-American male diagnosed with a stage IV Hodgkin lymphoma, nodular sclerosing subtype. He presented with a bulky left cervical and supraclavicular lymphadenopathy as well as involvement of the spleen, nodal disease in the porta hepatis, and extensive disease involving the marrow of the pelvis, spine, and ribs. He was diagnosed in September 2020.   He underwent systemic chemotherapy with brentuximab Vedotin in addition to doxorubicin, vinblastine and dacarbazine.  Status post 6 cycles. His last dose was in October 2021.    His last PET scan after the treatment showed almost complete resolution of his disease with no hypermetabolic activity in the neck, chest, abdomen pelvis or the osseous structures.  He has Deauville1.   The patient is currently on observation and he is feeling fine today with no concerning complaints.  He is here today for evaluation and repeat blood work.    Labs were reviewed.  Recommend continue on observation with a repeat CT scan in 6 months.  His CT scan will be a CT of the neck, chest, abdomen, pelvis.  I refilled his gabapentin for his neuropathy  which could be from his prior therapies versus alcohol abuse.  The patient had his Port-A-Cath flush today.  Reinforced that he will need this flush every 6 to 8 weeks and to be mindful not to miss his appointments.  We discussed his alcohol abuse today and he was encouraged to cut down his alcohol use and increase his intake of nutritious foods.  The patient was advised to call immediately if he has any concerning symptoms in the interval. The patient voices understanding of current disease status and treatment options and is in agreement with the current care plan. All questions were answered. The patient knows to call the clinic with any problems, questions  or concerns. We can certainly see the patient much sooner if necessary   Orders Placed This Encounter  Procedures   CT Soft Tissue Neck W Contrast    Standing Status:   Future    Standing Expiration Date:   01/26/2024    Order Specific Question:   If indicated for the ordered procedure, I authorize the administration of contrast media per Radiology protocol    Answer:   Yes    Order Specific Question:   Does the patient have a contrast media/X-ray dye allergy?    Answer:   No    Order Specific Question:   Preferred imaging location?    Answer:   [facility]   CT CHEST ABDOMEN PELVIS W CONTRAST    Standing Status:   Future    Standing Expiration Date:   01/26/2024    Order Specific Question:   If indicated for the ordered procedure, I authorize the administration of contrast media per Radiology protocol    Answer:   Yes    Order Specific Question:   Does the patient have a contrast media/X-ray dye allergy?    Answer:   No    Order Specific Question:   Preferred imaging location?    Answer:   [facility]    Order Specific Question:   If indicated for the ordered procedure, I authorize the administration of oral contrast media per Radiology protocol    Answer:   Yes   CBC with Differential (Cancer Center Only)    Standing Status:   Future    Standing Expiration Date:   01/26/2024   CMP (Cancer Center only)    Standing Status:   Future    Standing Expiration Date:   01/26/2024   Lactate dehydrogenase (LDH)    Standing Status:   Future    Standing Expiration Date:   01/26/2024     The total time spent in the appointment was 20-29 minutes  Halley Shepheard L Khali Perella, PA-C 01/26/23

## 2023-01-26 ENCOUNTER — Inpatient Hospital Stay: Payer: Medicaid Other

## 2023-01-26 ENCOUNTER — Inpatient Hospital Stay: Payer: Medicaid Other | Attending: Internal Medicine | Admitting: Physician Assistant

## 2023-01-26 ENCOUNTER — Other Ambulatory Visit: Payer: Self-pay

## 2023-01-26 DIAGNOSIS — Z79899 Other long term (current) drug therapy: Secondary | ICD-10-CM | POA: Diagnosis not present

## 2023-01-26 DIAGNOSIS — C8118 Nodular sclerosis classical Hodgkin lymphoma, lymph nodes of multiple sites: Secondary | ICD-10-CM

## 2023-01-26 DIAGNOSIS — R232 Flushing: Secondary | ICD-10-CM | POA: Diagnosis not present

## 2023-01-26 DIAGNOSIS — F1010 Alcohol abuse, uncomplicated: Secondary | ICD-10-CM | POA: Insufficient documentation

## 2023-01-26 DIAGNOSIS — C8119 Nodular sclerosis classical Hodgkin lymphoma, extranodal and solid organ sites: Secondary | ICD-10-CM | POA: Insufficient documentation

## 2023-01-26 DIAGNOSIS — G629 Polyneuropathy, unspecified: Secondary | ICD-10-CM | POA: Insufficient documentation

## 2023-01-26 DIAGNOSIS — R630 Anorexia: Secondary | ICD-10-CM | POA: Insufficient documentation

## 2023-01-26 DIAGNOSIS — Z95828 Presence of other vascular implants and grafts: Secondary | ICD-10-CM

## 2023-01-26 DIAGNOSIS — R590 Localized enlarged lymph nodes: Secondary | ICD-10-CM

## 2023-01-26 LAB — CMP (CANCER CENTER ONLY)
ALT: 17 U/L (ref 0–44)
AST: 20 U/L (ref 15–41)
Albumin: 4 g/dL (ref 3.5–5.0)
Alkaline Phosphatase: 73 U/L (ref 38–126)
Anion gap: 5 (ref 5–15)
BUN: 17 mg/dL (ref 6–20)
CO2: 29 mmol/L (ref 22–32)
Calcium: 9.3 mg/dL (ref 8.9–10.3)
Chloride: 105 mmol/L (ref 98–111)
Creatinine: 0.62 mg/dL (ref 0.61–1.24)
GFR, Estimated: >60 mL/min (ref >60)
Glucose, Bld: 101 mg/dL — ABNORMAL HIGH (ref 70–99)
Potassium: 3.9 mmol/L (ref 3.5–5.1)
Sodium: 139 mmol/L (ref 135–145)
Total Bilirubin: 0.6 mg/dL (ref 0.3–1.2)
Total Protein: 7.4 g/dL (ref 6.5–8.1)

## 2023-01-26 LAB — CBC WITH DIFFERENTIAL (CANCER CENTER ONLY)
Abs Immature Granulocytes: 0 10*3/uL (ref 0.00–0.07)
Basophils Absolute: 0 10*3/uL (ref 0.0–0.1)
Basophils Relative: 1 %
Eosinophils Absolute: 0 10*3/uL (ref 0.0–0.5)
Eosinophils Relative: 1 %
HCT: 40.7 % (ref 39.0–52.0)
Hemoglobin: 14.3 g/dL (ref 13.0–17.0)
Immature Granulocytes: 0 %
Lymphocytes Relative: 34 %
Lymphs Abs: 1.2 10*3/uL (ref 0.7–4.0)
MCH: 31.6 pg (ref 26.0–34.0)
MCHC: 35.1 g/dL (ref 30.0–36.0)
MCV: 89.8 fL (ref 80.0–100.0)
Monocytes Absolute: 0.4 10*3/uL (ref 0.1–1.0)
Monocytes Relative: 11 %
Neutro Abs: 1.9 10*3/uL (ref 1.7–7.7)
Neutrophils Relative %: 53 %
Platelet Count: 310 10*3/uL (ref 150–400)
RBC: 4.53 MIL/uL (ref 4.22–5.81)
RDW: 12.7 % (ref 11.5–15.5)
WBC Count: 3.6 10*3/uL — ABNORMAL LOW (ref 4.0–10.5)
nRBC: 0 % (ref 0.0–0.2)

## 2023-01-26 LAB — LACTATE DEHYDROGENASE: LDH: 133 U/L (ref 98–192)

## 2023-01-26 MED ORDER — GABAPENTIN 100 MG PO CAPS
100.0000 mg | ORAL_CAPSULE | Freq: Three times a day (TID) | ORAL | 2 refills | Status: DC
Start: 2023-01-26 — End: 2023-06-28

## 2023-01-26 MED ORDER — HEPARIN SOD (PORK) LOCK FLUSH 100 UNIT/ML IV SOLN
500.0000 [IU] | Freq: Once | INTRAVENOUS | Status: AC | PRN
  Administered 2023-01-26: 500 [IU]

## 2023-01-26 MED ORDER — SODIUM CHLORIDE 0.9% FLUSH
10.0000 mL | INTRAVENOUS | Status: DC | PRN
  Administered 2023-01-26: 10 mL

## 2023-01-26 MED ORDER — HEPARIN SOD (PORK) LOCK FLUSH 100 UNIT/ML IV SOLN: 500.0000 [IU] | Freq: Once | INTRAVENOUS | Status: DC

## 2023-01-26 MED ORDER — SODIUM CHLORIDE 0.9% FLUSH: 10.0000 mL | INTRAVENOUS | Status: DC | PRN

## 2023-03-10 ENCOUNTER — Inpatient Hospital Stay: Payer: Medicaid Other | Attending: Internal Medicine

## 2023-03-23 ENCOUNTER — Inpatient Hospital Stay: Payer: Medicaid Other | Attending: Internal Medicine

## 2023-04-21 ENCOUNTER — Inpatient Hospital Stay: Payer: Medicaid Other | Attending: Internal Medicine

## 2023-04-21 DIAGNOSIS — C8119 Nodular sclerosis classical Hodgkin lymphoma, extranodal and solid organ sites: Secondary | ICD-10-CM | POA: Insufficient documentation

## 2023-04-21 DIAGNOSIS — Z452 Encounter for adjustment and management of vascular access device: Secondary | ICD-10-CM | POA: Insufficient documentation

## 2023-04-21 DIAGNOSIS — Z95828 Presence of other vascular implants and grafts: Secondary | ICD-10-CM

## 2023-04-21 MED ORDER — SODIUM CHLORIDE 0.9% FLUSH
10.0000 mL | INTRAVENOUS | Status: DC | PRN
  Administered 2023-04-21: 10 mL

## 2023-04-21 MED ORDER — HEPARIN SOD (PORK) LOCK FLUSH 100 UNIT/ML IV SOLN
500.0000 [IU] | Freq: Once | INTRAVENOUS | Status: AC | PRN
  Administered 2023-04-21: 500 [IU]

## 2023-06-02 ENCOUNTER — Inpatient Hospital Stay: Payer: Medicaid Other | Attending: Internal Medicine

## 2023-06-08 ENCOUNTER — Inpatient Hospital Stay: Payer: Medicaid Other

## 2023-06-28 ENCOUNTER — Other Ambulatory Visit: Payer: Self-pay | Admitting: Physician Assistant

## 2023-06-28 ENCOUNTER — Telehealth: Payer: Self-pay | Admitting: Medical Oncology

## 2023-06-28 DIAGNOSIS — C8118 Nodular sclerosis classical Hodgkin lymphoma, lymph nodes of multiple sites: Secondary | ICD-10-CM

## 2023-06-28 DIAGNOSIS — G6289 Other specified polyneuropathies: Secondary | ICD-10-CM

## 2023-06-28 MED ORDER — GABAPENTIN 100 MG PO CAPS
200.0000 mg | ORAL_CAPSULE | Freq: Three times a day (TID) | ORAL | 4 refills | Status: DC
Start: 2023-06-28 — End: 2023-09-06

## 2023-06-28 NOTE — Telephone Encounter (Addendum)
"  Burning in legs". Requests refill Gabapentin and consider increase dose.

## 2023-07-06 ENCOUNTER — Encounter: Payer: Self-pay | Admitting: Internal Medicine

## 2023-07-06 NOTE — Telephone Encounter (Signed)
Telephone call  

## 2023-07-13 ENCOUNTER — Inpatient Hospital Stay: Payer: Medicaid Other | Attending: Internal Medicine

## 2023-07-28 ENCOUNTER — Inpatient Hospital Stay: Payer: Medicaid Other | Attending: Internal Medicine

## 2023-07-28 ENCOUNTER — Ambulatory Visit (HOSPITAL_COMMUNITY)
Admission: RE | Admit: 2023-07-28 | Discharge: 2023-07-28 | Disposition: A | Discharge status: Home / Self Care | Payer: Medicaid Other | Source: Ambulatory Visit | Attending: Physician Assistant | Admitting: Physician Assistant

## 2023-07-28 DIAGNOSIS — Z9889 Other specified postprocedural states: Secondary | ICD-10-CM | POA: Diagnosis not present

## 2023-07-28 DIAGNOSIS — C8119 Nodular sclerosis classical Hodgkin lymphoma, extranodal and solid organ sites: Secondary | ICD-10-CM | POA: Diagnosis present

## 2023-07-28 DIAGNOSIS — I700 Atherosclerosis of aorta: Secondary | ICD-10-CM | POA: Insufficient documentation

## 2023-07-28 DIAGNOSIS — C8118 Nodular sclerosis classical Hodgkin lymphoma, lymph nodes of multiple sites: Secondary | ICD-10-CM | POA: Insufficient documentation

## 2023-07-28 DIAGNOSIS — R232 Flushing: Secondary | ICD-10-CM | POA: Insufficient documentation

## 2023-07-28 DIAGNOSIS — Z79899 Other long term (current) drug therapy: Secondary | ICD-10-CM | POA: Insufficient documentation

## 2023-07-28 DIAGNOSIS — K7689 Other specified diseases of liver: Secondary | ICD-10-CM | POA: Diagnosis not present

## 2023-07-28 DIAGNOSIS — M4312 Spondylolisthesis, cervical region: Secondary | ICD-10-CM | POA: Insufficient documentation

## 2023-07-28 DIAGNOSIS — M2550 Pain in unspecified joint: Secondary | ICD-10-CM | POA: Diagnosis not present

## 2023-07-28 DIAGNOSIS — N400 Enlarged prostate without lower urinary tract symptoms: Secondary | ICD-10-CM | POA: Diagnosis not present

## 2023-07-28 DIAGNOSIS — R5383 Other fatigue: Secondary | ICD-10-CM | POA: Insufficient documentation

## 2023-07-28 DIAGNOSIS — K449 Diaphragmatic hernia without obstruction or gangrene: Secondary | ICD-10-CM | POA: Diagnosis not present

## 2023-07-28 DIAGNOSIS — R634 Abnormal weight loss: Secondary | ICD-10-CM | POA: Diagnosis not present

## 2023-07-28 DIAGNOSIS — Z95828 Presence of other vascular implants and grafts: Secondary | ICD-10-CM

## 2023-07-28 DIAGNOSIS — R590 Localized enlarged lymph nodes: Secondary | ICD-10-CM | POA: Diagnosis not present

## 2023-07-28 DIAGNOSIS — M25551 Pain in right hip: Secondary | ICD-10-CM | POA: Insufficient documentation

## 2023-07-28 LAB — CMP (CANCER CENTER ONLY)
ALT: 17 U/L (ref 0–44)
AST: 21 U/L (ref 15–41)
Albumin: 4.1 g/dL (ref 3.5–5.0)
Alkaline Phosphatase: 82 U/L (ref 38–126)
Anion gap: 6 (ref 5–15)
BUN: 21 mg/dL — ABNORMAL HIGH (ref 6–20)
CO2: 28 mmol/L (ref 22–32)
Calcium: 9 mg/dL (ref 8.9–10.3)
Chloride: 104 mmol/L (ref 98–111)
Creatinine: 0.72 mg/dL (ref 0.61–1.24)
GFR, Estimated: >60 mL/min (ref >60)
Glucose, Bld: 129 mg/dL — ABNORMAL HIGH (ref 70–99)
Potassium: 3.9 mmol/L (ref 3.5–5.1)
Sodium: 138 mmol/L (ref 135–145)
Total Bilirubin: 0.6 mg/dL (ref <1.2)
Total Protein: 7.4 g/dL (ref 6.5–8.1)

## 2023-07-28 LAB — CBC WITH DIFFERENTIAL (CANCER CENTER ONLY)
Abs Immature Granulocytes: 0 10*3/uL (ref 0.00–0.07)
Basophils Absolute: 0 10*3/uL (ref 0.0–0.1)
Basophils Relative: 1 %
Eosinophils Absolute: 0 10*3/uL (ref 0.0–0.5)
Eosinophils Relative: 1 %
HCT: 39.8 % (ref 39.0–52.0)
Hemoglobin: 14 g/dL (ref 13.0–17.0)
Immature Granulocytes: 0 %
Lymphocytes Relative: 39 %
Lymphs Abs: 1.3 10*3/uL (ref 0.7–4.0)
MCH: 32.3 pg (ref 26.0–34.0)
MCHC: 35.2 g/dL (ref 30.0–36.0)
MCV: 91.7 fL (ref 80.0–100.0)
Monocytes Absolute: 0.4 10*3/uL (ref 0.1–1.0)
Monocytes Relative: 13 %
Neutro Abs: 1.5 10*3/uL — ABNORMAL LOW (ref 1.7–7.7)
Neutrophils Relative %: 46 %
Platelet Count: 244 10*3/uL (ref 150–400)
RBC: 4.34 MIL/uL (ref 4.22–5.81)
RDW: 12.4 % (ref 11.5–15.5)
WBC Count: 3.2 10*3/uL — ABNORMAL LOW (ref 4.0–10.5)
nRBC: 0 % (ref 0.0–0.2)

## 2023-07-28 LAB — LACTATE DEHYDROGENASE: LDH: 128 U/L (ref 98–192)

## 2023-07-28 MED ORDER — HEPARIN SOD (PORK) LOCK FLUSH 100 UNIT/ML IV SOLN
500.0000 [IU] | Freq: Once | INTRAVENOUS | Status: AC
  Administered 2023-07-28: 500 [IU] via INTRAVENOUS

## 2023-07-28 MED ORDER — SODIUM CHLORIDE 0.9% FLUSH
10.0000 mL | INTRAVENOUS | Status: DC | PRN
  Administered 2023-07-28: 10 mL

## 2023-07-28 MED ORDER — IOHEXOL 300 MG/ML  SOLN
100.0000 mL | Freq: Once | INTRAMUSCULAR | Status: AC | PRN
  Administered 2023-07-28: 100 mL via INTRAVENOUS

## 2023-07-28 MED ORDER — IOHEXOL 300 MG/ML  SOLN
30.0000 mL | Freq: Once | INTRAMUSCULAR | Status: AC | PRN
  Administered 2023-07-28: 30 mL via ORAL

## 2023-08-01 ENCOUNTER — Inpatient Hospital Stay: Payer: Medicaid Other | Admitting: Internal Medicine

## 2023-08-12 ENCOUNTER — Telehealth: Payer: Self-pay | Admitting: Internal Medicine

## 2023-08-12 NOTE — Telephone Encounter (Signed)
Wife called to reschedule patient missed appt on 12/16 to 1230.Marland KitchenMarland Kitchen

## 2023-08-14 ENCOUNTER — Other Ambulatory Visit: Payer: Self-pay

## 2023-08-14 ENCOUNTER — Encounter (HOSPITAL_COMMUNITY): Payer: Self-pay

## 2023-08-14 ENCOUNTER — Emergency Department (HOSPITAL_COMMUNITY)
Admission: EM | Admit: 2023-08-14 | Discharge: 2023-08-14 | Disposition: A | Discharge status: Home / Self Care | Payer: Medicaid Other | Attending: Emergency Medicine | Admitting: Emergency Medicine

## 2023-08-14 ENCOUNTER — Emergency Department (HOSPITAL_COMMUNITY): Payer: Medicaid Other

## 2023-08-14 DIAGNOSIS — M25551 Pain in right hip: Secondary | ICD-10-CM | POA: Insufficient documentation

## 2023-08-14 DIAGNOSIS — Z8571 Personal history of Hodgkin lymphoma: Secondary | ICD-10-CM | POA: Diagnosis not present

## 2023-08-14 MED ORDER — OXYCODONE HCL 5 MG PO TABS: 5.0000 mg | ORAL_TABLET | Freq: Four times a day (QID) | ORAL | 0 refills | Status: AC | PRN

## 2023-08-14 MED ORDER — OXYCODONE-ACETAMINOPHEN 5-325 MG PO TABS
1.0000 | ORAL_TABLET | ORAL | Status: DC | PRN
  Administered 2023-08-14: 1 via ORAL
  Filled 2023-08-14: qty 1

## 2023-08-14 NOTE — ED Triage Notes (Signed)
Patient reports right sided hip pain x 3 days. Patient had hogdkin lymphoma and recently went into remission. Recently had CT of abdomen and pelvis.

## 2023-08-14 NOTE — ED Provider Notes (Signed)
[facility] Provider Note   CSN: [id number] Arrival date & time: 08/14/23  1035     History Chief Complaint  Patient presents with   Hip Pain    HPI Jesse [name] is a 57 y.o. male presenting for chief complaint of right hip pain.  He has a history of Hodgkin's lymphoma now in remission.  Follows with oncology next has a follow-up appointment tomorrow.  He states that his right hip started hurting approximately 3 days prior to arrival it is worse with ambulation.  Denies fevers chills nausea vomiting shortness of breath.  History of similar intermittent musculoskeletal pains over the past 2 years as part of his treatment.  Did have a CT scan of his chest abdomen pelvis recently that had no focal pathology or bony involvement..   Patient's recorded medical, surgical, social, medication list and allergies were reviewed in the Snapshot window as part of the initial history.   Review of Systems   Review of Systems  Constitutional:  Negative for chills and fever.  HENT:  Negative for ear pain and sore throat.   Eyes:  Negative for pain and visual disturbance.  Respiratory:  Negative for cough and shortness of breath.   Cardiovascular:  Negative for chest pain and palpitations.  Gastrointestinal:  Negative for abdominal pain and vomiting.  Genitourinary:  Negative for dysuria and hematuria.  Musculoskeletal:  Negative for arthralgias and back pain.  Skin:  Negative for color change and rash.  Neurological:  Negative for seizures and syncope.  All other systems reviewed and are negative.   Physical Exam Updated Vital Signs BP (!) 142/94 (BP Location: Right Arm)   Pulse 67   Temp 98.1 F (36.7 C) (Oral)   Resp 16   Ht 5\' 6"  (1.676 m)   Wt 60.1 kg   SpO2 100%   BMI 21.39 kg/m  Physical Exam Vitals and nursing note reviewed.  Constitutional:      General: He is not in acute distress.    Appearance: He is well-developed.   HENT:     Head: Normocephalic and atraumatic.  Eyes:     Conjunctiva/sclera: Conjunctivae normal.  Cardiovascular:     Rate and Rhythm: Normal rate and regular rhythm.     Heart sounds: No murmur heard. Pulmonary:     Effort: Pulmonary effort is normal. No respiratory distress.     Breath sounds: Normal breath sounds.  Abdominal:     Palpations: Abdomen is soft.     Tenderness: There is no abdominal tenderness.  Musculoskeletal:        General: Tenderness (Mild tenderness palpation of the right hip.  Ranging joint passively without difficulty.) present. No swelling.     Cervical back: Neck supple.  Skin:    General: Skin is warm and dry.     Capillary Refill: Capillary refill takes less than 2 seconds.  Neurological:     Mental Status: He is alert.  Psychiatric:        Mood and Affect: Mood normal.      ED Course/ Medical Decision Making/ A&P    Procedures Procedures   Medications Ordered in ED Medications  oxyCODONE-acetaminophen (PERCOCET/ROXICET) 5-325 MG per tablet 1 tablet (1 tablet Oral Given 08/14/23 1112)    Medical Decision Making:   57 year old male presenting with right hip pain.  Likely musculoskeletal.  X-ray performed to evaluate for fracture or bony metastatic disease.  X-ray negative for any acute pathology at this  time. Patient is able to ambulate.  Pain is under control with current therapy.  Will send short course of acute pain control to patient's pharmacy while he plans to follow-up with his PCP/oncologist and orthopedist over the next week. Clinically inconsistent with septic arthritis, gout or other acute pathology at this time. Disposition:  I have considered need for hospitalization, however, considering all of the above, I believe this patient is stable for discharge at this time.  Patient/family educated about specific return precautions for given chief complaint and symptoms.  Patient/family educated about follow-up with PCP and orthopedics.      Patient/family expressed understanding of return precautions and need for follow-up. Patient spoken to regarding all imaging and laboratory results and appropriate follow up for these results. All education provided in verbal form with additional information in written form. Time was allowed for answering of patient questions. Patient discharged.    Emergency Department Medication Summary:   Medications  oxyCODONE-acetaminophen (PERCOCET/ROXICET) 5-325 MG per tablet 1 tablet (1 tablet Oral Given 08/14/23 1112)         Clinical Impression:  1. Right hip pain      Data Unavailable   Final Clinical Impression(s) / ED Diagnoses Final diagnoses:  Right hip pain    Rx / DC Orders ED Discharge Orders          Ordered    oxyCODONE (ROXICODONE) 5 MG immediate release tablet  Every 6 hours PRN        08/14/23 1321              Glyn Ade, MD 08/14/23 1322

## 2023-08-15 ENCOUNTER — Inpatient Hospital Stay (HOSPITAL_BASED_OUTPATIENT_CLINIC_OR_DEPARTMENT_OTHER): Payer: Medicaid Other | Admitting: Internal Medicine

## 2023-08-15 VITALS — BP 128/79 | HR 69 | Temp 98.4°F | Resp 18 | Ht 66.0 in | Wt 134.8 lb

## 2023-08-15 DIAGNOSIS — C8112 Nodular sclerosis classical Hodgkin lymphoma, intrathoracic lymph nodes: Secondary | ICD-10-CM | POA: Diagnosis not present

## 2023-08-15 DIAGNOSIS — C8119 Nodular sclerosis classical Hodgkin lymphoma, extranodal and solid organ sites: Secondary | ICD-10-CM | POA: Diagnosis not present

## 2023-08-15 NOTE — Progress Notes (Signed)
[facility] [phone]   [phone]  OFFICE PROGRESS NOTE  [name], FNP [address] [place] [address]  DIAGNOSIS: Stage IVB Hodgkin's Lymphoma, nodular sclerosis subtype. He presented with a bulky left cervical and supraclavicular lymphadenopathy as well as involvement of the spleen, nodal disease in the porta hepatis, and extensive disease involving the marrow of the pelvis, spine, and ribs. He was diagnosed in September 2020.    IPS score: 4    PRIOR THERAPY: Systemic chemotherapy with brentuximab vedotin 1.2 mg/kg, doxorubicin 25 mg/m2, vinblastine 6 mg/m2, dacarbazine 375 mg/m2 on days 1 and 15 every 28 days. First dose expected on 05/30/2019.   Status post 6 cycles.    CURRENT THERAPY:  Observation.  INTERVAL HISTORY: Jesse [name] 57 y.o. male returns to the clinic today for 25-month follow-up visit.Discussed the use of AI scribe software for clinical note transcription with the patient, who gave verbal consent to proceed.  History of Present Illness   The patient, a 57 year old diagnosed with stage four Hodgkin lymphoma nodular sclerosing subtype in September 2020, has been on observation for years following six cycles of chemotherapy with brentuximab, doxorubicin, vinblastine, and dacarbazine. The patient presented with a new complaint of right-sided hip pain that does not radiate down the leg. The pain was severe enough to warrant a visit to urgent care, where X-rays were taken and the patient was told it might be arthritis or sciatica. The patient has been managing the pain with medication and hot pads, but reports that the pain returns after the effects of these interventions wear off. The patient was given a prescription for Percocet at urgent care. The patient also has a port which he flushes every two months.        MEDICAL HISTORY: Past Medical History:  Diagnosis Date   ETOH abuse     hodgkins lymphoma 2020   Lymphadenopathy    left   Marijuana abuse     ALLERGIES:  has no known allergies.  MEDICATIONS:  Current Outpatient Medications  Medication Sig Dispense Refill   gabapentin (NEURONTIN) 100 MG capsule Take 2 capsules (200 mg total) by mouth 3 (three) times daily. 90 capsule 4   lidocaine-prilocaine (EMLA) cream Apply 1 application topically as needed. Apply to port site  45 minutes prior to port being accessed 30 g 1   oxyCODONE (ROXICODONE) 5 MG immediate release tablet Take 1 tablet (5 mg total) by mouth every 6 (six) hours as needed for severe pain (pain score 7-10). 15 tablet 0   No current facility-administered medications for this visit.   Facility-Administered Medications Ordered in Other Visits  Medication Dose Route Frequency Provider Last Rate Last Admin   sodium chloride flush (NS) 0.9 % injection 10 mL  10 mL Intracatheter PRN Si Gaul, MD   10 mL at 07/01/21 1551    SURGICAL HISTORY:  Past Surgical History:  Procedure Laterality Date   HERNIA REPAIR     as child   IR IMAGING GUIDED PORT INSERTION  05/29/2019   LYMPH NODE BIOPSY Left 05/07/2019   Procedure: LYMPH NODE BIOPSY;  Surgeon: Christia Reading, MD;  Location:  SURGERY CENTER;  Service: ENT;  Laterality: Left;    REVIEW OF SYSTEMS:  Constitutional: negative Eyes: negative Ears, nose, mouth, throat, and face: negative Respiratory: negative Cardiovascular: negative Gastrointestinal: negative Genitourinary:negative Integument/breast: negative Hematologic/lymphatic: negative Musculoskeletal:positive for arthralgias Neurological: negative Behavioral/Psych: negative Endocrine: negative Allergic/Immunologic: negative  PHYSICAL EXAMINATION: General appearance: alert, cooperative, and no distress Head: Normocephalic, without obvious abnormality, atraumatic Neck: no adenopathy, no JVD, supple, symmetrical, trachea midline, and thyroid not enlarged, symmetric, no  tenderness/mass/nodules Lymph nodes: Cervical, supraclavicular, and axillary nodes normal. Resp: clear to auscultation bilaterally Back: symmetric, no curvature. ROM normal. No CVA tenderness. Cardio: regular rate and rhythm, S1, S2 normal, no murmur, click, rub or gallop GI: soft, non-tender; bowel sounds normal; no masses,  no organomegaly Extremities: extremities normal, atraumatic, no cyanosis or edema Neurologic: Alert and oriented X 3, normal strength and tone. Normal symmetric reflexes. Normal coordination and gait  ECOG PERFORMANCE STATUS: 1 - Symptomatic but completely ambulatory  Blood pressure 128/79, pulse 69, temperature 98.4 F (36.9 C), temperature source Temporal, resp. rate 18, height 5\' 6"  (1.676 m), weight 134 lb 12.8 oz (61.1 kg), SpO2 100%.  LABORATORY DATA: Lab Results  Component Value Date   WBC 3.2 (L) 07/28/2023   HGB 14.0 07/28/2023   HCT 39.8 07/28/2023   MCV 91.7 07/28/2023   PLT 244 07/28/2023      Chemistry      Component Value Date/Time   NA 138 07/28/2023 1033   K 3.9 07/28/2023 1033   CL 104 07/28/2023 1033   CO2 28 07/28/2023 1033   BUN 21 (H) 07/28/2023 1033   CREATININE 0.72 07/28/2023 1033      Component Value Date/Time   CALCIUM 9.0 07/28/2023 1033   ALKPHOS 82 07/28/2023 1033   AST 21 07/28/2023 1033   ALT 17 07/28/2023 1033   BILITOT 0.6 07/28/2023 1033       RADIOGRAPHIC STUDIES: DG Hip Unilat W or Wo Pelvis 2-3 Views Right Result Date: 08/14/2023 CLINICAL DATA:  57 year old male with hip pain for 3 days. Lymphoma. EXAM: DG HIP (WITH OR WITHOUT PELVIS) 2-3V RIGHT COMPARISON:  Restaging CT Chest, Abdomen, and Pelvis 07/28/2023. FINDINGS: Three views at 1209 hours. Numerous pelvic phleboliths redemonstrated. Bone mineralization is within normal limits. Femoral heads normally located. Pelvis appears intact. SI joints within normal limits. Nonobstructed bowel-gas pattern. Grossly intact proximal left femur. Proximal right femur  appears intact on 2 dedicated views. Calcified right femoral artery atherosclerosis appears advanced. IMPRESSION: 1. No acute osseous abnormality identified about the right hip or pelvis. 2. Advanced calcified right femoral artery atherosclerosis. Electronically Signed   By: Odessa Fleming M.D.   On: 08/14/2023 12:47   CT Soft Tissue Neck W Contrast Result Date: 07/30/2023 CLINICAL DATA:  Hematologic malignancy. Assess treatment response. Stage IV hide scans lymphoma, monitoring. EXAM: CT NECK WITH CONTRAST TECHNIQUE: Multidetector CT imaging of the neck was performed using the standard protocol following the bolus administration of intravenous contrast. RADIATION DOSE REDUCTION: This exam was performed according to the departmental dose-optimization program which includes automated exposure control, adjustment of the mA and/or kV according to patient size and/or use of iterative reconstruction technique. CONTRAST:  30mL OMNIPAQUE IOHEXOL 300 MG/ML SOLN, OMNIPAQUE IOHEXOL 300 MG/ML SOLN COMPARISON:  CT neck with contrast 06/21/2022 FINDINGS: Pharynx and larynx: No focal mucosal or submucosal lesions are present. Nasopharynx is clear. The soft palate and tongue base are within normal limits. The oropharynx is otherwise unremarkable. Vallecula and epiglottis are within normal limits. Aryepiglottic folds and piriform sinuses are clear. Vocal cords are midline and symmetric. Trachea is clear. Salivary glands: The submandibular and parotid glands and ducts are within normal limits. Thyroid: Normal Lymph nodes: Posterior level 2 lymph nodes are stable bilaterally. 2 posterior left level 3 lymph nodes measure 9 and  10 mm respectively on the sagittal images. These are stable. No new or enlarging nodes are present. Vascular: Minimal atherosclerotic changes are present the carotid bifurcations bilaterally without significant stenosis relative to the more distal vessel. A right IJ Port-A-Cath is present. No radiographic  complications are present. Limited intracranial: Within normal limits. Visualized orbits: The globes and orbits are within normal limits. Mastoids and visualized paranasal sinuses: The paranasal sinuses and mastoid air cells are clear. The globes and orbits are within normal limits. Skeleton: Multilevel degenerative changes are present in the upper cervical spine. Slight degenerative retrolisthesis is again noted at C3-4. No focal osseous lesions are present. Straightening of the normal cervical lordosis is stable. Upper chest: The lung apices are clear. The thoracic inlet is within normal limits. IMPRESSION: 1. Stable CT of the neck. 2. No new or enlarging lymph nodes. 3. Multilevel degenerative changes in the upper cervical spine. Electronically Signed   By: Marin Roberts M.D.   On: 07/30/2023 13:47   CT CHEST ABDOMEN PELVIS W CONTRAST Result Date: 07/28/2023 CLINICAL DATA:  History of nodular sclerosing Hodgkin's lymphoma, follow-up. * Tracking Code: BO * EXAM: CT CHEST, ABDOMEN, AND PELVIS WITH CONTRAST TECHNIQUE: Multidetector CT imaging of the chest, abdomen and pelvis was performed following the standard protocol during bolus administration of intravenous contrast. RADIATION DOSE REDUCTION: This exam was performed according to the departmental dose-optimization program which includes automated exposure control, adjustment of the mA and/or kV according to patient size and/or use of iterative reconstruction technique. CONTRAST:  30mL OMNIPAQUE IOHEXOL 300 MG/ML SOLN, OMNIPAQUE IOHEXOL 300 MG/ML SOLN COMPARISON:  Multiple priors including most recent CT June 21, 2022 FINDINGS: CT CHEST FINDINGS Cardiovascular: Accessed right chest Port-A-Cath with tip at the superior cavoatrial junction. Gas in the pulmonary outflow tract and right atrium related to vascular access. Normal caliber thoracic aorta. No central pulmonary embolus on this nondedicated study. Three-vessel coronary artery  calcifications Mediastinum/Nodes: Right supraclavicular lymph node measures 8 mm in short axis on image 12/2 previously 10 mm. No pathologically enlarged mediastinal, hilar or axillary lymph nodes. The esophagus is grossly unremarkable. Small hiatal hernia no suspicious thyroid nodule. Lungs/Pleura: No suspicious pulmonary nodules or masses. No pleural effusion. No pneumothorax. Musculoskeletal: No aggressive lytic or blastic lesion of bone. Multilevel degenerative change of the spine. CT ABDOMEN PELVIS FINDINGS Hepatobiliary: Stable bilobar hepatic cysts and hemangiomata measuring up to 4 cm in the right lobe of the liver on image 55/2. No new suspicious hepatic lesion. Gallbladder is unremarkable.  No biliary ductal dilation. Pancreas: No pancreatic ductal dilation or evidence of acute inflammation. Spleen: No splenomegaly or focal splenic lesion. Adrenals/Urinary Tract: Bilateral adrenal glands appear normal. No hydronephrosis. Kidneys demonstrate symmetric enhancement. Urinary bladder is unremarkable for degree of distension. Stomach/Bowel: Radiopaque enteric contrast material traverses the descending colon. Stomach is distended with ingested material and gas without focal wall thickening. No pathologic dilation of small or large bowel. No evidence of acute bowel inflammation. Vascular/Lymphatic: Normal caliber abdominal aorta. Aortic atherosclerosis. Mixing artifact in the IVC at the level of the renal veins. The portal, splenic and superior mesenteric veins are patent. No pathologically enlarged abdominal or pelvic lymph nodes. Reproductive: Enlarged prostate gland. Other: No significant abdominopelvic free fluid Musculoskeletal: Stable lesions in the bilateral gluteal musculature measuring up to 3.4 cm in the left gluteus on image 92/2. No aggressive lytic or blastic lesion of bone IMPRESSION: 1. Slight interval decrease in size of the right supraclavicular lymph node. No new or progressive disease in  the  chest, abdomen or pelvis. 2. Stable hazy nonspecific lesions in the bilateral gluteal musculature measuring up to 3.4 cm in the left gluteus. 3.  Aortic Atherosclerosis (ICD10-I70.0). Electronically Signed   By: Maudry Mayhew M.D.   On: 07/28/2023 15:52     ASSESSMENT AND PLAN: This is a very pleasant 57 years old African-American male recently diagnosed with a stage IV non-Hodgkin lymphoma, nodular sclerosing subtype.  The patient is currently undergoing systemic chemotherapy with brentuximab Vedotin in addition to doxorubicin, vinblastine and dacarbazine.  Status post 6 cycles.  The patient tolerated his treatment fairly well with no concerning adverse effects except for fatigue.   His last PET scan after the treatment showed almost complete resolution of his disease with no hypermetabolic activity in the neck, chest, abdomen pelvis or the osseous structures.  He has Deauville1. The patient has been on observation since that time and he is feeling fine except for increasing fatigue and recent weight loss. He was found on previous CT scan of the neck on January 27, 2022 to have suspicious recurrent lymphadenopathy in the right neck with the largest lymph node measuring 2.4 x 1.4 x 1.5 cm at the right level 2.  There was also enlargement of a level 2 node on the left measuring 1.3 x 1.7 x 2.0 cm again worrisome for recurrent disease. The patient had a PET scan on 02/10/2022 and he is here for evaluation and discussion of his scan results.  His PET scan showed bilateral enlarged cervical lymph nodes which demonstrate hypermetabolic activity with a reference left level 2 cervical lymph node measuring 1.4 cm in short axis with SUV max of 3.1.  There was also hypermetabolic enlarged left axillary lymph node measuring 1.1 cm with SUV max of 3.8.  There was no hypermetabolic mediastinal or hilar lymph nodes and no suspicious pulmonary nodules.  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 The patient underwent ultrasound-guided core biopsy of the left axillary suspicious lymphadenopathy.  The final pathology was negative for malignancy and showed reactive lymphoid hyperplasia. The patient is currently on observation and he is feeling fine with no concerning complaints except for arthralgia in his right hip. He had repeat CT scan of the neck, chest, abdomen and pelvis performed recently.  I personally and independently reviewed the scan and discussed the result with the patient today.  His scan showed no concerning findings for disease recurrence or metastasis.    Stage IV Hodgkin Lymphoma, Nodular Sclerosing Subtype Diagnosed in September 2020. Received six cycles of brentuximab, doxorubicin, vinblastine, and dacarbazine. Currently under observation with well-managed disease. Recent scans show no new growths and a slight decrease in the right supraclavicular lymph node. Blood work is normal. No aggressive bone involvement. Prefers to continue observation. - Continue observation - Schedule follow-up in six months  Right Hip Pain Recent onset of right hip pain, non-radiating. X-rays suggest arthritis or sciatica. Pain managed with oral medications and hot pads. No steroid injections given. - Continue current pain management with prescribed medications and hot pads - Consider further evaluation if pain persists or worsens  General Health Maintenance Maintaining port by flushing every two months. - Continue flushing port every two months.   The patient was advised to call immediately if he has any other concerning symptoms in the interval. The patient voices understanding  of current disease status and treatment options and is in agreement with the current care plan. All questions were answered. The patient knows to call the clinic with  any problems, questions or concerns. We can certainly see the patient much sooner if necessary.  Disclaimer: This note was dictated with voice recognition software. Similar sounding words can inadvertently be transcribed and may not be corrected upon review.

## 2023-08-25 ENCOUNTER — Inpatient Hospital Stay: Payer: Medicaid Other | Attending: Internal Medicine

## 2023-09-06 ENCOUNTER — Telehealth: Payer: Self-pay | Admitting: Medical Oncology

## 2023-09-06 ENCOUNTER — Other Ambulatory Visit: Payer: Self-pay | Admitting: Physician Assistant

## 2023-09-06 DIAGNOSIS — C8118 Nodular sclerosis classical Hodgkin lymphoma, lymph nodes of multiple sites: Secondary | ICD-10-CM

## 2023-09-06 DIAGNOSIS — G6289 Other specified polyneuropathies: Secondary | ICD-10-CM

## 2023-09-06 MED ORDER — GABAPENTIN 100 MG PO CAPS: 200.0000 mg | ORAL_CAPSULE | Freq: Three times a day (TID) | ORAL | 4 refills | Status: DC

## 2023-09-06 NOTE — Telephone Encounter (Signed)
Pt requests Gabapentin refill

## 2023-09-09 ENCOUNTER — Inpatient Hospital Stay: Payer: Medicaid Other

## 2023-09-27 ENCOUNTER — Telehealth: Payer: Self-pay | Admitting: Medical Oncology

## 2023-09-27 NOTE — Telephone Encounter (Signed)
Pt requesting Gabapentin refill . I told her it was sent in Jan with 4 refills to [facility].

## 2023-10-27 ENCOUNTER — Inpatient Hospital Stay: Payer: Medicaid Other | Attending: Internal Medicine

## 2023-10-31 ENCOUNTER — Inpatient Hospital Stay

## 2023-10-31 ENCOUNTER — Telehealth: Payer: Self-pay | Admitting: Internal Medicine

## 2023-10-31 NOTE — Telephone Encounter (Signed)
 Patient called to reschedule his missed port flush appointment.

## 2023-11-01 ENCOUNTER — Inpatient Hospital Stay

## 2023-11-01 ENCOUNTER — Telehealth: Payer: Self-pay | Admitting: Internal Medicine

## 2023-11-01 NOTE — Telephone Encounter (Signed)
 Rescheduled missed appointment. The patient is aware of the scheduled appointment details.

## 2023-12-21 ENCOUNTER — Other Ambulatory Visit: Payer: Self-pay | Admitting: Physician Assistant

## 2023-12-21 DIAGNOSIS — G6289 Other specified polyneuropathies: Secondary | ICD-10-CM

## 2023-12-21 DIAGNOSIS — C8118 Nodular sclerosis classical Hodgkin lymphoma, lymph nodes of multiple sites: Secondary | ICD-10-CM

## 2023-12-22 ENCOUNTER — Inpatient Hospital Stay: Payer: Medicaid Other

## 2023-12-22 ENCOUNTER — Telehealth: Payer: Self-pay | Admitting: Internal Medicine

## 2023-12-22 NOTE — Telephone Encounter (Signed)
 I returned Janice's call from 5/6 as she left a voicemail to cancel [place] appointment on 5/5 but there was no appointment scheduled on 5/5. I advised Leola Raisin to call me back at my direct line.

## 2024-02-06 ENCOUNTER — Encounter (HOSPITAL_COMMUNITY): Payer: Self-pay

## 2024-02-06 ENCOUNTER — Inpatient Hospital Stay: Attending: Internal Medicine

## 2024-02-06 ENCOUNTER — Ambulatory Visit (HOSPITAL_COMMUNITY)
Admission: RE | Admit: 2024-02-06 | Discharge: 2024-02-06 | Disposition: A | Discharge status: Home / Self Care | Source: Ambulatory Visit | Attending: Internal Medicine | Admitting: Internal Medicine

## 2024-02-06 DIAGNOSIS — C8112 Nodular sclerosis classical Hodgkin lymphoma, intrathoracic lymph nodes: Secondary | ICD-10-CM

## 2024-02-06 DIAGNOSIS — Z79899 Other long term (current) drug therapy: Secondary | ICD-10-CM | POA: Insufficient documentation

## 2024-02-06 DIAGNOSIS — N400 Enlarged prostate without lower urinary tract symptoms: Secondary | ICD-10-CM | POA: Insufficient documentation

## 2024-02-06 DIAGNOSIS — C8119 Nodular sclerosis classical Hodgkin lymphoma, extranodal and solid organ sites: Secondary | ICD-10-CM | POA: Insufficient documentation

## 2024-02-06 DIAGNOSIS — Z95828 Presence of other vascular implants and grafts: Secondary | ICD-10-CM

## 2024-02-06 LAB — CMP (CANCER CENTER ONLY)
ALT: 22 U/L (ref 0–44)
AST: 28 U/L (ref 15–41)
Albumin: 4.5 g/dL (ref 3.5–5.0)
Alkaline Phosphatase: 81 U/L (ref 38–126)
Anion gap: 8 (ref 5–15)
BUN: 23 mg/dL — ABNORMAL HIGH (ref 6–20)
CO2: 27 mmol/L (ref 22–32)
Calcium: 9.2 mg/dL (ref 8.9–10.3)
Chloride: 101 mmol/L (ref 98–111)
Creatinine: 0.83 mg/dL (ref 0.61–1.24)
GFR, Estimated: >60 mL/min (ref >60)
Glucose, Bld: 155 mg/dL — ABNORMAL HIGH (ref 70–99)
Potassium: 3.9 mmol/L (ref 3.5–5.1)
Sodium: 136 mmol/L (ref 135–145)
Total Bilirubin: 0.8 mg/dL (ref 0.0–1.2)
Total Protein: 7.8 g/dL (ref 6.5–8.1)

## 2024-02-06 LAB — CBC WITH DIFFERENTIAL (CANCER CENTER ONLY)
Abs Immature Granulocytes: 0 10*3/uL (ref 0.00–0.07)
Basophils Absolute: 0 10*3/uL (ref 0.0–0.1)
Basophils Relative: 1 %
Eosinophils Absolute: 0 10*3/uL (ref 0.0–0.5)
Eosinophils Relative: 1 %
HCT: 43.1 % (ref 39.0–52.0)
Hemoglobin: 15.1 g/dL (ref 13.0–17.0)
Immature Granulocytes: 0 %
Lymphocytes Relative: 32 %
Lymphs Abs: 1.2 10*3/uL (ref 0.7–4.0)
MCH: 30.9 pg (ref 26.0–34.0)
MCHC: 35 g/dL (ref 30.0–36.0)
MCV: 88.1 fL (ref 80.0–100.0)
Monocytes Absolute: 0.5 10*3/uL (ref 0.1–1.0)
Monocytes Relative: 12 %
Neutro Abs: 2.1 10*3/uL (ref 1.7–7.7)
Neutrophils Relative %: 54 %
Platelet Count: 265 10*3/uL (ref 150–400)
RBC: 4.89 MIL/uL (ref 4.22–5.81)
RDW: 12.4 % (ref 11.5–15.5)
WBC Count: 3.8 10*3/uL — ABNORMAL LOW (ref 4.0–10.5)
nRBC: 0 % (ref 0.0–0.2)

## 2024-02-06 LAB — LACTATE DEHYDROGENASE: LDH: 150 U/L (ref 98–192)

## 2024-02-06 MED ORDER — HEPARIN SOD (PORK) LOCK FLUSH 100 UNIT/ML IV SOLN
500.0000 [IU] | Freq: Once | INTRAVENOUS | Status: AC
  Administered 2024-02-06: 500 [IU] via INTRAVENOUS

## 2024-02-06 MED ORDER — SODIUM CHLORIDE (PF) 0.9 % IJ SOLN
INTRAMUSCULAR | Status: AC
  Filled 2024-02-06: qty 50

## 2024-02-06 MED ORDER — IOHEXOL 9 MG/ML PO SOLN
1000.0000 mL | Freq: Once | ORAL | Status: AC
  Administered 2024-02-06: 1000 mL via ORAL

## 2024-02-06 MED ORDER — IOHEXOL 300 MG/ML  SOLN
100.0000 mL | Freq: Once | INTRAMUSCULAR | Status: AC | PRN
Start: 2024-02-06 — End: 2024-02-06
  Administered 2024-02-06: 100 mL via INTRAVENOUS

## 2024-02-06 MED ORDER — HEPARIN SOD (PORK) LOCK FLUSH 100 UNIT/ML IV SOLN
INTRAVENOUS | Status: AC
  Filled 2024-02-06: qty 5

## 2024-02-06 MED ORDER — IOHEXOL 9 MG/ML PO SOLN
ORAL | Status: AC
  Filled 2024-02-06: qty 1000

## 2024-02-06 MED ORDER — SODIUM CHLORIDE 0.9% FLUSH
10.0000 mL | INTRAVENOUS | Status: DC | PRN
  Administered 2024-02-06: 10 mL

## 2024-02-21 ENCOUNTER — Telehealth: Payer: Self-pay | Admitting: Physician Assistant

## 2024-02-21 NOTE — Telephone Encounter (Signed)
 Rescheduled appointment with the patient wife per provider out of office. Confirmed appointment changes with the patient wife.

## 2024-02-23 ENCOUNTER — Other Ambulatory Visit: Payer: Medicaid Other

## 2024-02-23 ENCOUNTER — Inpatient Hospital Stay: Payer: Medicaid Other | Admitting: Physician Assistant

## 2024-03-07 NOTE — Progress Notes (Signed)
 [facility] OFFICE PROGRESS NOTE  Jesse [name], Jesse [name]  DIAGNOSIS: Stage IVB Hodgkin's Lymphoma, nodular sclerosis subtype. He presented with a bulky left cervical and supraclavicular lymphadenopathy as well as involvement of the spleen, nodal disease in the porta hepatis, and extensive disease involving the marrow of the pelvis, spine, and ribs. He was diagnosed in September 2020.    IPS score: 4   PRIOR THERAPY: Systemic chemotherapy with brentuximab vedotin  1.2 mg/kg, doxorubicin  25 mg/m2, vinblastine  6 mg/m2, dacarbazine  375 mg/m2 on days 1 and 15 every 28 days. First dose expected on 05/30/2019.   Status post 6 cycles.    CURRENT THERAPY: Observation   INTERVAL HISTORY: Jesse [name] 58 y.o. male returns to the clinic today for a 19-month follow-up visit.  The patient was last seen in the clinic on 08/15/23. The patient has been on observation since completing treatment with chemotherapy in March 2021.   He is feeling well but he chronically struggles with poor appetite. He drinks beer regularly. He is thinking about quitting.   Overall, he is feeling well today. He denies any new concerning adenopathy. He denies any fever, chills, or night sweats.  He denies any recent or recurrent signs of infection including nasal congestion, cough, shortness of breath, skin infections, or dysuria. Denies any early satiety or abdominal fullness.  Denies any abnormal bleeding or bruising.  He has a port. This was last flushed today. He also has peripheral neuropathy. He takes 200 mg of gabapentin  in the AM per patient report. He is here today for evaluation and repeat blood work.   MEDICAL HISTORY: Past Medical History:  Diagnosis Date   ETOH abuse    hodgkins lymphoma 2020   Lymphadenopathy    left   Marijuana abuse     ALLERGIES:  has no known allergies.  MEDICATIONS:  Current Outpatient Medications  Medication Sig  Dispense Refill   gabapentin  (NEURONTIN ) 100 MG capsule TAKE 1 CAPSULE BY MOUTH 3 TIMES DAILY 90 capsule 0   lidocaine -prilocaine  (EMLA ) cream Apply 1 application topically as needed. Apply to port site  45 minutes prior to port being accessed 30 g 1   oxyCODONE  (ROXICODONE ) 5 MG immediate release tablet Take 1 tablet (5 mg total) by mouth every 6 (six) hours as needed for severe pain (pain score 7-10). 15 tablet 0   No current facility-administered medications for this visit.   Facility-Administered Medications Ordered in Other Visits  Medication Dose Route Frequency Provider Last Rate Last Admin   sodium chloride  flush (NS) 0.9 % injection 10 mL  10 mL Intracatheter PRN Jesse [name]   10 mL at 07/01/21 1551    SURGICAL HISTORY:  Past Surgical History:  Procedure Laterality Date   HERNIA REPAIR     as child   IR IMAGING GUIDED PORT INSERTION  05/29/2019   LYMPH NODE BIOPSY Left 05/07/2019   Procedure: LYMPH NODE BIOPSY;  Surgeon: Jesse Clark, [name];  Location: [facility];  Service: ENT;  Laterality: Left;    REVIEW OF SYSTEMS:   Review of Systems  Constitutional: Positive for poor appetite. Negative for chills, fatigue, fever and unexpected weight change.  HENT: Negative for mouth sores, nosebleeds, sore throat and trouble swallowing.   Eyes: Negative for eye problems and icterus.  Respiratory: Negative for cough, hemoptysis, shortness of breath and wheezing.   Cardiovascular: Negative for chest pain and leg swelling.  Gastrointestinal: Negative for abdominal  pain, constipation, diarrhea, nausea and vomiting.  Genitourinary: Negative for bladder incontinence, difficulty urinating, dysuria, frequency and hematuria.   Musculoskeletal: Negative for back pain, gait problem, neck pain and neck stiffness.  Skin: Negative for itching and rash.  Neurological: Negative for dizziness, extremity weakness, gait problem, headaches, light-headedness and seizures.   Hematological: Negative for adenopathy. Does not bruise/bleed easily.  Psychiatric/Behavioral: Negative for confusion, depression and sleep disturbance. The patient is not nervous/anxious.     PHYSICAL EXAMINATION:  Blood pressure 114/63, pulse 81, temperature 98 F (36.7 C), temperature source Temporal, resp. rate 13, weight 130 lb 11.2 oz (59.3 kg), SpO2 98%.  ECOG PERFORMANCE STATUS: 0  Physical Exam  Constitutional: Oriented to person, place, and time and well-developed, well-nourished, and in no distress.  HENT:  Head: Normocephalic and atraumatic.  Mouth/Throat: Oropharynx is clear and moist. No oropharyngeal exudate.  Eyes: Conjunctivae are normal. Right eye exhibits no discharge. Left eye exhibits no discharge. No scleral icterus.  Neck: Normal range of motion. Neck supple.  Cardiovascular: Normal rate, regular rhythm, normal heart sounds and intact distal pulses.   Pulmonary/Chest: Effort normal and breath sounds normal. No respiratory distress. No wheezes. No rales.  Abdominal: Soft. Bowel sounds are normal. Exhibits no distension and no mass. There is no tenderness.  Musculoskeletal: Normal range of motion. Exhibits no edema.  Lymphadenopathy:    No cervical adenopathy.  Neurological: Alert and oriented to person, place, and time. Exhibits normal muscle tone. Gait normal. Coordination normal.  Skin: Skin is warm and dry. No rash noted. Not diaphoretic. No erythema. No pallor.  Psychiatric: Mood, memory and judgment normal.  Vitals reviewed.  LABORATORY DATA: Lab Results  Component Value Date   WBC 3.8 (L) 02/06/2024   HGB 15.1 02/06/2024   HCT 43.1 02/06/2024   MCV 88.1 02/06/2024   PLT 265 02/06/2024      Chemistry      Component Value Date/Time   NA 136 02/06/2024 0739   K 3.9 02/06/2024 0739   CL 101 02/06/2024 0739   CO2 27 02/06/2024 0739   BUN 23 (H) 02/06/2024 0739   CREATININE 0.83 02/06/2024 0739      Component Value Date/Time   CALCIUM 9.2  02/06/2024 0739   ALKPHOS 81 02/06/2024 0739   AST 28 02/06/2024 0739   ALT 22 02/06/2024 0739   BILITOT 0.8 02/06/2024 0739       RADIOGRAPHIC STUDIES:  No results found.   ASSESSMENT/PLAN:  This is a very pleasant 58 years old African-American male diagnosed with a stage IV Hodgkin lymphoma, nodular sclerosing subtype. He presented with a bulky left cervical and supraclavicular lymphadenopathy as well as involvement of the spleen, nodal disease in the porta hepatis, and extensive disease involving the marrow of the pelvis, spine, and ribs. He was diagnosed in September 2020.   He underwent systemic chemotherapy with brentuximab Vedotin  in addition to doxorubicin , vinblastine  and dacarbazine .  Status post 6 cycles. His last dose was in October 2021.    His last PET scan after the treatment showed almost complete resolution of his disease with no hypermetabolic activity in the neck, chest, abdomen pelvis or the osseous structures.  He has Deauville1.   The patient is currently on observation and he is feeling fine today with no concerning complaints.   He is here today for evaluation and repeat blood work.   The patient was seen with Dr. Sherrod today.  Dr. Sherrod personally and independently reviewed the scan and discussed results with the  patient today.  The scan showed no evidence of disease progression.  Dr. Sherrod recommends he continue on observation with repeat labs in 6 months and repeat imaging in 1 year.   We will flush his port today. He has appointments in September and November to have this flushed (no blood work needed). I gave him a copy of his appointments today.   He was strongly encouraged to reduce/quit drinking alcohol.   The patient was advised to call immediately if she has any concerning symptoms in the interval. The patient voices understanding of current disease status and treatment options and is in agreement with the current care plan. All questions were  answered. The patient knows to call the clinic with any problems, questions or concerns. We can certainly see the patient much sooner if necessary   Orders Placed This Encounter  Procedures   CBC with Differential (Cancer Center Only)    Standing Status:   Future    Expected Date:   09/13/2024    Expiration Date:   03/13/2025   CMP (Cancer Center only)    Standing Status:   Future    Expected Date:   09/13/2024    Expiration Date:   03/13/2025   Lactate dehydrogenase (LDH)    Standing Status:   Future    Expected Date:   09/13/2024    Expiration Date:   03/13/2025       Siria Calandro L Arrabella Westerman, PA-C 03/13/24  ADDENDUM: Hematology/Oncology Attending: I had a face-to-face encounter with the patient today.  I reviewed his records, lab, scan and recommended his care plan.  This is a very pleasant 58 years old African-American male with stage IV Hodgkin's lymphoma, nodular sclerosis subtype diagnosed in September 2020 status post systemic chemotherapy with brentuximab vedotin , doxorubicin , vinblastine  and dacarbazine  for 6 cycles and has been on observation since that time.  The patient is feeling fine with no concerning complaints except for persistent weight loss.  He drinks a lot of alcohol at regular basis and I strongly advised him to quit alcohol drinking.  He had repeat CT scan of the neck, chest, abdomen and pelvis performed recently.  I personally independently reviewed the scans and discussed the result with the patient today.  His scan showed no concerning findings for disease recurrence or metastasis. I recommended for him to continue on observation with repeat blood work in 6 months.  We will repeat his imaging studies on an annual basis. The patient was advised to call immediately if he has any other concerning symptoms in the interval. The total time spent in the appointment was 30 minutes including review of chart and various tests results, discussions about plan of care and  coordination of care plan . Disclaimer: This note was dictated with voice recognition software. Similar sounding words can inadvertently be transcribed and may be missed upon review. Jesse MARLA Sherrod, [name]

## 2024-03-13 ENCOUNTER — Inpatient Hospital Stay: Attending: Physician Assistant | Admitting: Physician Assistant

## 2024-03-13 VITALS — BP 114/63 | HR 81 | Temp 98.0°F | Resp 13 | Wt 130.7 lb

## 2024-03-13 DIAGNOSIS — C8119 Nodular sclerosis classical Hodgkin lymphoma, extranodal and solid organ sites: Secondary | ICD-10-CM | POA: Insufficient documentation

## 2024-03-13 DIAGNOSIS — C8111 Nodular sclerosis classical Hodgkin lymphoma, lymph nodes of head, face, and neck: Secondary | ICD-10-CM | POA: Diagnosis not present

## 2024-03-13 DIAGNOSIS — Z79899 Other long term (current) drug therapy: Secondary | ICD-10-CM | POA: Insufficient documentation

## 2024-03-13 DIAGNOSIS — R630 Anorexia: Secondary | ICD-10-CM | POA: Diagnosis not present

## 2024-03-13 DIAGNOSIS — Z9221 Personal history of antineoplastic chemotherapy: Secondary | ICD-10-CM | POA: Diagnosis not present

## 2024-03-13 DIAGNOSIS — R590 Localized enlarged lymph nodes: Secondary | ICD-10-CM | POA: Diagnosis not present

## 2024-03-13 DIAGNOSIS — R232 Flushing: Secondary | ICD-10-CM | POA: Insufficient documentation

## 2024-03-13 DIAGNOSIS — G629 Polyneuropathy, unspecified: Secondary | ICD-10-CM | POA: Insufficient documentation

## 2024-04-24 ENCOUNTER — Other Ambulatory Visit: Payer: Self-pay | Admitting: Physician Assistant

## 2024-04-24 DIAGNOSIS — G6289 Other specified polyneuropathies: Secondary | ICD-10-CM

## 2024-04-24 DIAGNOSIS — C8118 Nodular sclerosis classical Hodgkin lymphoma, lymph nodes of multiple sites: Secondary | ICD-10-CM

## 2024-04-26 ENCOUNTER — Inpatient Hospital Stay: Payer: Medicaid Other

## 2024-06-21 ENCOUNTER — Inpatient Hospital Stay: Payer: Medicaid Other

## 2024-06-21 ENCOUNTER — Telehealth: Payer: Self-pay | Admitting: *Deleted

## 2024-06-21 NOTE — Telephone Encounter (Signed)
 Pt. missed last 2 port flush appts. Rescheduled flush appt.06/21/24 at 3:30 PM while on phone. Pt. aware of day/time.

## 2024-06-22 ENCOUNTER — Inpatient Hospital Stay

## 2024-08-15 ENCOUNTER — Inpatient Hospital Stay

## 2024-09-14 ENCOUNTER — Telehealth: Payer: Self-pay | Admitting: Internal Medicine

## 2024-09-17 ENCOUNTER — Inpatient Hospital Stay

## 2024-09-17 ENCOUNTER — Inpatient Hospital Stay: Admitting: Internal Medicine

## 2024-10-04 ENCOUNTER — Inpatient Hospital Stay: Admitting: Internal Medicine

## 2024-10-04 ENCOUNTER — Inpatient Hospital Stay
